# Patient Record
Sex: Female | Born: 1955 | Race: Black or African American | Hispanic: No | Smoking: Never smoker
Health system: Southern US, Community
[De-identification: ages and names within clinical notes are randomized; demographics above are authoritative.]

## PROBLEM LIST (undated history)

## (undated) DIAGNOSIS — I639 Cerebral infarction, unspecified: Secondary | ICD-10-CM

## (undated) DIAGNOSIS — E119 Type 2 diabetes mellitus without complications: Secondary | ICD-10-CM

## (undated) DIAGNOSIS — N189 Chronic kidney disease, unspecified: Secondary | ICD-10-CM

## (undated) DIAGNOSIS — I82409 Acute embolism and thrombosis of unspecified deep veins of unspecified lower extremity: Secondary | ICD-10-CM

## (undated) DIAGNOSIS — H547 Unspecified visual loss: Secondary | ICD-10-CM

## (undated) DIAGNOSIS — K219 Gastro-esophageal reflux disease without esophagitis: Secondary | ICD-10-CM

## (undated) DIAGNOSIS — D649 Anemia, unspecified: Secondary | ICD-10-CM

## (undated) DIAGNOSIS — I10 Essential (primary) hypertension: Secondary | ICD-10-CM

## (undated) HISTORY — DX: Chronic kidney disease, unspecified: N18.9

## (undated) HISTORY — DX: Gastro-esophageal reflux disease without esophagitis: K21.9

## (undated) HISTORY — DX: Acute embolism and thrombosis of unspecified deep veins of unspecified lower extremity: I82.409

## (undated) HISTORY — DX: Essential (primary) hypertension: I10

## (undated) HISTORY — DX: Unspecified visual loss: H54.7

## (undated) HISTORY — PX: APPENDECTOMY: SHX54

## (undated) HISTORY — DX: Anemia, unspecified: D64.9

## (undated) HISTORY — PX: LIPOMA EXCISION: SHX5283

## (undated) HISTORY — DX: Cerebral infarction, unspecified: I63.9

## (undated) HISTORY — DX: Type 2 diabetes mellitus without complications: E11.9

---

## 2010-05-30 ENCOUNTER — Other Ambulatory Visit (HOSPITAL_COMMUNITY): Payer: Self-pay

## 2010-05-30 DIAGNOSIS — R0602 Shortness of breath: Secondary | ICD-10-CM

## 2012-09-08 ENCOUNTER — Ambulatory Visit: Payer: Self-pay | Admitting: Internal Medicine

## 2012-09-09 ENCOUNTER — Encounter: Payer: Self-pay | Admitting: Internal Medicine

## 2012-09-09 ENCOUNTER — Ambulatory Visit (INDEPENDENT_AMBULATORY_CARE_PROVIDER_SITE_OTHER): Payer: 59 | Admitting: Internal Medicine

## 2012-09-09 VITALS — BP 151/93 | HR 92 | Temp 98.2°F | Ht 71.0 in | Wt 248.0 lb

## 2012-09-09 DIAGNOSIS — E1169 Type 2 diabetes mellitus with other specified complication: Secondary | ICD-10-CM | POA: Insufficient documentation

## 2012-09-09 DIAGNOSIS — N289 Disorder of kidney and ureter, unspecified: Secondary | ICD-10-CM

## 2012-09-09 DIAGNOSIS — M9080 Osteopathy in diseases classified elsewhere, unspecified site: Secondary | ICD-10-CM

## 2012-09-09 DIAGNOSIS — M869 Osteomyelitis, unspecified: Secondary | ICD-10-CM

## 2012-09-09 DIAGNOSIS — E1149 Type 2 diabetes mellitus with other diabetic neurological complication: Secondary | ICD-10-CM

## 2012-09-09 MED ORDER — LEVOFLOXACIN 750 MG PO TABS: 750.0000 mg | ORAL_TABLET | ORAL | Status: DC

## 2012-09-09 NOTE — Assessment & Plan Note (Addendum)
Her GFR is around 50 and I suspect it is going to decrease some with vancomycin. This will be monitored weekly.  If her creatinine worsens at all, she may need to change her metformin

## 2012-09-09 NOTE — Progress Notes (Signed)
  Subjective:    Patient ID: Emily Shea, female    DOB: 12/21/1955, 57 y.o.   MRN: [id number]  HPI He comes in for evaluation as a new patient. She has a history of diabetes and tells me it is in good control but has significant peripheral neuropathy and did develop a draining ulcer on the left great toe at the base of the nailbed. She did develop an abscess and did open on its own and drained pus. No culture has been obtained. She was placed empirically on doxycycline and has had some improvement. No fever or chills. She did receive a dose of Rocephin as well. She is been off of antibiotics for about 2 days. She had a bone scan done by her primary physician which is consistent with osteomyelitis. The lesion of osteomyelitis is at the area of the great toe where the wound developed. She is also developed 2 small areas on her left lateral malleolus and left side of her foot but are not draining and not infected. Some recent constipation.  She does not know of any history of poor circulation but denies any known recent test of her circulation. No history of smoking. No history of osteomyelitis. She is hopeful to keep her toe. No antibiotic allergies and no recent rashes or problems with her doxycycline.   Review of Systems  Constitutional: Negative for fatigue.  Respiratory: Negative for cough and shortness of breath.   Cardiovascular: Negative for leg swelling.  Gastrointestinal: Positive for constipation. Negative for nausea, abdominal pain and diarrhea.  Genitourinary: Negative for difficulty urinating.  Musculoskeletal: Negative for myalgias, joint swelling and arthralgias.  Skin: Negative for rash.  Neurological: Negative for dizziness, light-headedness and headaches.  Hematological: Negative for adenopathy.  Psychiatric/Behavioral: Negative for dysphoric mood.       Objective:   Physical Exam  Constitutional: She appears well-developed and well-nourished. No distress.  Eyes: No scleral  icterus.  Cardiovascular: Normal rate, regular rhythm and normal heart sounds.   No murmur heard. Pulmonary/Chest: Effort normal and breath sounds normal. No respiratory distress.  Abdominal: Soft. Bowel sounds are normal. She exhibits no distension. There is no tenderness.  Musculoskeletal: She exhibits edema.  She had trace edema of her feet bilateral  Lymphadenopathy:    She has no cervical adenopathy.  Skin:  Right great toe with open ulcer at the nailbed, dry with no drainage          Assessment & Plan:

## 2012-09-09 NOTE — Assessment & Plan Note (Signed)
She does have osteomyelitis by the bone scan. Since she has been on recent antibiotics, bone biopsy likely to be unrevealing. I will start her on empiric therapy with vancomycin with a trough level of 15-20 and also Levaquin at 750 mg every other day do to her mild renal insufficiency and likelihood for some decrease in her GFR on vancomycin. She will continue this for 6 weeks. We'll have her return in 2 weeks to assure treatment is going well. She will get weekly labs. I discussed with her at length that the hope is to treat this. There is always risk of unsuccessful treatment and need for amputation, and the patient is aware.

## 2012-09-09 NOTE — Assessment & Plan Note (Signed)
I did express her need to have good control of her diabetes through her medications and food intake. She voiced her understanding.

## 2012-09-10 ENCOUNTER — Ambulatory Visit (HOSPITAL_COMMUNITY)
Admission: RE | Admit: 2012-09-10 | Discharge: 2012-09-10 | Disposition: A | Discharge status: Home / Self Care | Payer: Medicare FFS | Source: Ambulatory Visit | Attending: Internal Medicine | Admitting: Internal Medicine

## 2012-09-10 ENCOUNTER — Other Ambulatory Visit: Payer: Self-pay | Admitting: Internal Medicine

## 2012-09-10 DIAGNOSIS — E1169 Type 2 diabetes mellitus with other specified complication: Secondary | ICD-10-CM

## 2012-09-10 DIAGNOSIS — M869 Osteomyelitis, unspecified: Secondary | ICD-10-CM

## 2012-09-23 ENCOUNTER — Ambulatory Visit (INDEPENDENT_AMBULATORY_CARE_PROVIDER_SITE_OTHER): Payer: 59 | Admitting: Internal Medicine

## 2012-09-23 ENCOUNTER — Encounter: Payer: Self-pay | Admitting: Internal Medicine

## 2012-09-23 VITALS — BP 159/91 | HR 99 | Temp 98.2°F | Ht 71.0 in | Wt 259.0 lb

## 2012-09-23 DIAGNOSIS — E1169 Type 2 diabetes mellitus with other specified complication: Secondary | ICD-10-CM

## 2012-09-23 DIAGNOSIS — M9080 Osteopathy in diseases classified elsewhere, unspecified site: Secondary | ICD-10-CM

## 2012-09-23 DIAGNOSIS — M869 Osteomyelitis, unspecified: Secondary | ICD-10-CM

## 2012-09-23 NOTE — Assessment & Plan Note (Addendum)
She is doing well with her treatment we'll continue for the presumed 6 weeks. She was told to continue to have good diabetes control and is going to back to her primary doctor for continued management to see if she needs an increase in her insulin. Her blood sugars do run close to 200 most the time including her fasting a.m. Blood sugar.   With the vancomycin and Levaquin and renal insufficiency, she likely will should not get increasing her metformin.

## 2012-09-23 NOTE — Progress Notes (Signed)
  Subjective:    Patient ID: Emily Shea, female    DOB: 11/21/1955, 57 y.o.   MRN: [id number]  HPI Comes in for follow up of osteomyelitis. She has a history of diabetes and tells me it is in good control but has significant peripheral neuropathy and did develop a draining ulcer on the left great toe at the base of the nailbed. She did develop an abscess and did open on its own and drained pus. No culture has been obtained. She was placed empirically on doxycycline and has had some improvement. No fever or chills. She did receive a dose of Rocephin as well. She is been off of antibiotics for about 2 days. She had a bone scan done by her primary physician which is consistent with osteomyelitis. The lesion of osteomyelitis is at the area of the great toe where the wound developed. She is also developed 2 small areas on her left lateral malleolus and left side of her foot but are not draining and not infected. Some recent constipation. She does not know of any history of poor circulation but denies any known recent test of her circulation. No history of smoking. No history of osteomyelitis. She is hopeful to keep her toe. No antibiotic allergies and no recent rashes or problems with her doxycycline.   She was started on empiric vancomycin and every other day Levaquin and is here for followup today.  Having no diarrhea, no rash or side effects. She is tolerating well and did start her antibiotics on June 13.    Review of Systems  Constitutional: Negative for fever and fatigue.  Gastrointestinal: Positive for constipation. Negative for nausea and diarrhea.  Skin: Negative for rash.  Neurological: Negative for dizziness, light-headedness and headaches.  Hematological: Negative for adenopathy.       Objective:   Physical Exam  Constitutional: She appears well-developed and well-nourished. No distress.  Skin:  Her right first toe is dry with the no discharge          Assessment & Plan:

## 2012-10-08 ENCOUNTER — Telehealth: Payer: Self-pay | Admitting: Licensed Clinical Social Worker

## 2012-10-08 NOTE — Telephone Encounter (Signed)
Patient's caretaker called stating that the patient has nausea, low back pain, and headaches that started 2 days ago. She had dizziness on Tuesday and fell without injury. Patient contacted primary they suggested she call us because it could be coming from her IV Vancomycin.Please advise

## 2012-10-12 NOTE — Telephone Encounter (Signed)
Emily Shea, patient should be seen asap, it sounds like she is dehydrated? Or hypotensive. Please call family to have her seen by pcp or urgent care or [place].

## 2012-10-18 ENCOUNTER — Telehealth: Payer: Self-pay | Admitting: *Deleted

## 2012-10-18 NOTE — Telephone Encounter (Signed)
Sarah, [facility] RN, calling to see if patient should keep PICC until her follow up appointment on 10/26/12. Pt's IV ABX to be stopped 10/22/12, when [facility] can remove the PICC. RN advised [facility] RN that pt should keep her PICC until f/u with Dr. Luciana Axe on the 29th and that we can pull the PICC here if necessary.  [facility] RN verbalized agreement, will give pt supplies and teaching to keep her PICC flushed and patent until f/u here. Andree Coss, RN

## 2012-10-26 ENCOUNTER — Telehealth: Payer: Self-pay | Admitting: *Deleted

## 2012-10-26 ENCOUNTER — Encounter: Payer: Self-pay | Admitting: Internal Medicine

## 2012-10-26 ENCOUNTER — Ambulatory Visit (INDEPENDENT_AMBULATORY_CARE_PROVIDER_SITE_OTHER): Payer: 59 | Admitting: Internal Medicine

## 2012-10-26 VITALS — BP 185/113 | HR 93 | Temp 98.2°F | Ht 71.0 in | Wt 259.0 lb

## 2012-10-26 DIAGNOSIS — E1169 Type 2 diabetes mellitus with other specified complication: Secondary | ICD-10-CM

## 2012-10-26 DIAGNOSIS — M869 Osteomyelitis, unspecified: Secondary | ICD-10-CM

## 2012-10-26 DIAGNOSIS — M9080 Osteopathy in diseases classified elsewhere, unspecified site: Secondary | ICD-10-CM

## 2012-10-26 NOTE — Progress Notes (Signed)
  Subjective:    Patient ID: Emily Shea, female    DOB: 12/24/1955, 57 y.o.   MRN: [id number]  HPI  Comes in for follow up of osteomyelitis, now 6 weeks into her 6 week course. She has a history of diabetes and tells me it is in good control but has significant peripheral neuropathy and did develop a draining ulcer on the left great toe at the base of the nailbed. She did develop an abscess and did open on its own and drained pus. No culture has been obtained. She was placed empirically on doxycycline and has had some improvement. No fever or chills. She did receive a dose of Rocephin as well. She is been off of antibiotics for about 2 days. She had a bone scan done by her primary physician which is consistent with osteomyelitis. The lesion of osteomyelitis is at the area of the great toe where the wound developed. She is also developed 2 small areas on her left lateral malleolus and left side of her foot but are not draining and not infected. Some recent constipation. She does not know of any history of poor circulation but denies any known recent test of her circulation. No history of smoking. No history of osteomyelitis.   She was started on empiric vancomycin and every other day Levaquin and is here for followup today. Having no diarrhea, no rash or side effects. She is tolerating well and did start her antibiotics on June 13.  She now has completed 6 weeks and is doing well. Her toe has no discharge and no increase in pain. She is having some headache and back pain. Her blood pressure was notably elevated today.   Review of Systems  Constitutional: Negative for fever and fatigue.  HENT: Negative for congestion.   Eyes: Negative for photophobia and visual disturbance.  Respiratory: Negative for shortness of breath.   Gastrointestinal: Negative for nausea and diarrhea.  Musculoskeletal: Positive for back pain.  Skin: Negative for rash.  Neurological: Positive for headaches. Negative for  dizziness and light-headedness.  Hematological: Negative for adenopathy.  Psychiatric/Behavioral: Negative for dysphoric mood.       Objective:   Physical Exam  Constitutional: She is oriented to person, place, and time. She appears well-developed and well-nourished. No distress.  Eyes: No scleral icterus.  Musculoskeletal:  PICC line was no discharge.  Neurological: She is alert and oriented to person, place, and time.  Skin: No rash noted.          Assessment & Plan:

## 2012-10-26 NOTE — Assessment & Plan Note (Signed)
She has now completed a Schnelle course of therapy. I will stop the antibiotics and home health will pull her PICC line. She can followup p.r.n. I did tell her if she develops significant pain in her toe or swelling or any new drainage that she should be seen by an orthopedist. She otherwise can return here for any concerns in the future.

## 2012-10-26 NOTE — Telephone Encounter (Signed)
Verbal order per Dr. Luciana Axe given to [facility] at [facility] to pull patient's picc line. Wendall Mola

## 2012-11-11 ENCOUNTER — Encounter: Payer: Self-pay | Admitting: Internal Medicine

## 2013-02-04 ENCOUNTER — Encounter: Payer: Self-pay | Admitting: Internal Medicine

## 2013-03-04 ENCOUNTER — Other Ambulatory Visit: Payer: Self-pay | Admitting: Vascular Surgery

## 2013-03-04 DIAGNOSIS — L97909 Non-pressure chronic ulcer of unspecified part of unspecified lower leg with unspecified severity: Secondary | ICD-10-CM

## 2013-03-04 DIAGNOSIS — I739 Peripheral vascular disease, unspecified: Secondary | ICD-10-CM

## 2013-03-07 ENCOUNTER — Encounter: Payer: Self-pay | Admitting: Vascular Surgery

## 2013-03-21 ENCOUNTER — Encounter: Payer: Self-pay | Admitting: Vascular Surgery

## 2013-03-22 ENCOUNTER — Ambulatory Visit (INDEPENDENT_AMBULATORY_CARE_PROVIDER_SITE_OTHER): Payer: 59 | Admitting: Vascular Surgery

## 2013-03-22 ENCOUNTER — Encounter (HOSPITAL_COMMUNITY): Payer: Self-pay | Admitting: Pharmacy Technician

## 2013-03-22 ENCOUNTER — Encounter (INDEPENDENT_AMBULATORY_CARE_PROVIDER_SITE_OTHER): Payer: Self-pay

## 2013-03-22 ENCOUNTER — Ambulatory Visit (HOSPITAL_COMMUNITY)
Admission: RE | Admit: 2013-03-22 | Discharge: 2013-03-22 | Disposition: A | Discharge status: Home / Self Care | Payer: Medicare FFS | Source: Ambulatory Visit | Attending: Vascular Surgery | Admitting: Vascular Surgery

## 2013-03-22 ENCOUNTER — Other Ambulatory Visit: Payer: Self-pay | Admitting: *Deleted

## 2013-03-22 ENCOUNTER — Encounter: Payer: Self-pay | Admitting: *Deleted

## 2013-03-22 ENCOUNTER — Encounter: Payer: Self-pay | Admitting: Vascular Surgery

## 2013-03-22 VITALS — BP 152/82 | HR 96 | Ht 71.0 in | Wt 256.8 lb

## 2013-03-22 DIAGNOSIS — I7025 Atherosclerosis of native arteries of other extremities with ulceration: Secondary | ICD-10-CM | POA: Insufficient documentation

## 2013-03-22 DIAGNOSIS — I739 Peripheral vascular disease, unspecified: Secondary | ICD-10-CM | POA: Insufficient documentation

## 2013-03-22 DIAGNOSIS — Z01818 Encounter for other preprocedural examination: Secondary | ICD-10-CM

## 2013-03-22 DIAGNOSIS — L97909 Non-pressure chronic ulcer of unspecified part of unspecified lower leg with unspecified severity: Secondary | ICD-10-CM

## 2013-03-22 NOTE — Progress Notes (Signed)
Patient name: Emily Shea MRN: [id number] DOB: 08/21/1955 Sex: female   Referred by: Tanda Rockers  Reason for referral:  Chief Complaint  Patient presents with  . New Evaluation    non healing ischemic L leg ulcer      HISTORY OF PRESENT ILLNESS: The patient presents today for evaluation of nonhealing wounds over her left lateral malleolus and left fifth metatarsal head. She has a [place] history of severe diabetes hypertension does have a history of stroke. This left her with visual changes. She has had a several month history of nonhealing ulceration over her left foot. She does report moderate discomfort associated with this. She also had a area of nonhealing ulcer over the tip of her left great toe proximal may 6 months ago. She had a PIC line for antibiotic treatment and eventually had healing of this.  Past Medical History  Diagnosis Date  . Diabetes mellitus without complication   . Chronic kidney disease   . GERD (gastroesophageal reflux disease)   . Stroke   . Hypertension   . Blindness   . Anemia   . DVT (deep venous thrombosis)     Past Surgical History  Procedure Laterality Date  . Appendectomy    . Lipoma excision      from L flank    History   Social History  . Marital Status: Divorced    Spouse Name: N/A    Number of Children: N/A  . Years of Education: N/A   Occupational History  . Not on file.   Social History Main Topics  . Smoking status: Never Smoker   . Smokeless tobacco: Never Used  . Alcohol Use: No  . Drug Use: No  . Sexual Activity: Not on file   Other Topics Concern  . Not on file   Social History Narrative  . No narrative on file    Family History  Problem Relation Age of Onset  . Diabetes Mother   . Heart disease Mother   . Hypertension Mother   . Peripheral vascular disease Mother     amputation  . Diabetes Father   . Hypertension Father   . Varicose Veins Father   . Peripheral vascular disease Father     amputation    . Cancer Sister   . Diabetes Sister   . Hypertension Sister   . Diabetes Brother   . Hypertension Brother   . Peripheral vascular disease Brother     amputation    Allergies as of 03/22/2013 - Review Complete 03/22/2013  Allergen Reaction Noted  . Procardia [nifedipine]  09/09/2012    Current Outpatient Prescriptions on File Prior to Visit  Medication Sig Dispense Refill  . aspirin 81 MG tablet Take 81 mg by mouth daily.      . ciclopirox (PENLAC) 8 % solution Apply 1 application topically at bedtime. Apply over nail and surrounding skin. Apply daily over previous coat. After seven (7) days, may remove with alcohol and continue cycle.      . ferrous sulfate 325 (65 FE) MG tablet Take 325 mg by mouth daily with breakfast.      . Liraglutide (VICTOZA [place]) Inject 18 mg into the skin daily.      Marland Kitchen losartan-hydrochlorothiazide (HYZAAR) 100-25 MG per tablet Take 1 tablet by mouth daily.      . metFORMIN (GLUCOPHAGE) 1000 MG tablet Take 850 mg by mouth 3 (three) times daily.      . metoprolol succinate (TOPROL-XL) 50  MG 24 hr tablet Take 50 mg by mouth daily. Take with or immediately following a meal.      . potassium chloride SA (K-DUR,KLOR-CON) 20 MEQ tablet Take 20 mEq by mouth daily.      . pravastatin (PRAVACHOL) 20 MG tablet Take 20 mg by mouth daily.      . ranitidine (ZANTAC) 150 MG tablet Take 150 mg by mouth daily.      . sodium chloride 0.9 % SOLN 250 mL with vancomycin 1000 MG SOLR 1,000 mg Inject 1,000 mg into the vein daily.      . solifenacin (VESICARE) 5 MG tablet Take 10 mg by mouth daily.      Marland Kitchen triamcinolone cream (KENALOG) 0.1 % Apply 1 application topically 2 (two) times daily.       No current facility-administered medications on file prior to visit.     REVIEW OF SYSTEMS:  Positives indicated with an "X"  CARDIOVASCULAR:  [ ]  chest pain   [ ]  chest pressure   [ ]  palpitations   [ ]  orthopnea   [ ]  dyspnea on exertion   [ ]  claudication   [ ]  rest pain   [x ]  DVT   [ ]  phlebitis PULMONARY:   [ ]  productive cough   [ ]  asthma   [ ]  wheezing NEUROLOGIC:   [ ]  weakness  [ ]  paresthesias  [ ]  aphasia  [ ]  amaurosis  [ ]  dizziness HEMATOLOGIC:   [ ]  bleeding problems   [ ]  clotting disorders MUSCULOSKELETAL:  [ ]  joint pain   [ ]  joint swelling GASTROINTESTINAL: [ ]   blood in stool  [ ]   hematemesis GENITOURINARY:  [ ]   dysuria  [ ]   hematuria PSYCHIATRIC:  [ ]  history of major depression INTEGUMENTARY:  [ ]  rashes  [x ] ulcers CONSTITUTIONAL:  [ ]  fever   [ ]  chills  PHYSICAL EXAMINATION:  General: The patient is a well-nourished female, in no acute distress. Vital signs are BP 152/82  Pulse 96  Ht 5\' 11"  (1.803 m)  Wt 256 lb 12.8 oz (116.484 kg)  BMI 35.83 kg/m2  SpO2 100% Pulmonary: There is a good air exchange bilaterally without wheezing or rales. Abdomen: Soft and non-tender with normal pitch bowel sounds. Musculoskeletal: There are no major deformities.   Neurologic: No focal weakness or paresthesias are detected, Skin: She does have a small half centimeter ulcer of her lateral malleolus on the left she has a more concerning wound over her lateral fifth metatarsal head with approximately 1 cm open ulceration and dry gangrenous changes around the periphery of this. Psychiatric: The patient has normal affect. Cardiovascular: There is a regular rate and rhythm without significant murmur appreciated. Pulse status 2+ radial 2+ femoral pulses. Absent popliteal and distal pulses bilaterally  [facility] Vascular Lab Studies:  Ordered and Independently Reviewed her ankle arm indices at outlying hospital are unreliable due to calcification. 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 Difficult to determine tibial flow. She did have normal triphasic flow to the level of her femoral artery on the  Impression and Plan:  Nonhealing ulcers on left foot related to arterial  insufficiency related to diabetes and hypertension. Explain the need for arteriography for further evaluation. Explain that she may be a candidate for stenting or may require bypass. Also explained that she may have unreconstructable disease. I did explain she is at  risk for amputation. I feel that she had minimum she would eventually require a left fifth toe ray amputation for healing. She does have a history of a very mild renal insufficiency. Most recent creatinine in our system is 1.25. She is scheduled for outpatient arteriography with Dr. Myra Gianotti on 12:30. She understands if she has reasonable angioplasty would be treated at this time. Otherwise she will have a discussion regarding bypass options.    Tylar Amborn [facility] [place] Office: [phone]

## 2013-03-29 ENCOUNTER — Ambulatory Visit (HOSPITAL_COMMUNITY)
Admission: RE | Admit: 2013-03-29 | Discharge: 2013-03-29 | Disposition: A | Discharge status: Home / Self Care | Payer: Medicare FFS | Source: Ambulatory Visit | Attending: Surgery | Admitting: Surgery

## 2013-03-29 ENCOUNTER — Encounter (HOSPITAL_COMMUNITY)
Admission: RE | Disposition: A | Discharge status: Home / Self Care | Payer: Self-pay | Source: Ambulatory Visit | Attending: Surgery

## 2013-03-29 DIAGNOSIS — Z01818 Encounter for other preprocedural examination: Secondary | ICD-10-CM

## 2013-03-29 DIAGNOSIS — B372 Candidiasis of skin and nail: Secondary | ICD-10-CM | POA: Diagnosis not present

## 2013-03-29 DIAGNOSIS — Z5309 Procedure and treatment not carried out because of other contraindication: Secondary | ICD-10-CM | POA: Diagnosis not present

## 2013-03-29 DIAGNOSIS — L97809 Non-pressure chronic ulcer of other part of unspecified lower leg with unspecified severity: Secondary | ICD-10-CM | POA: Diagnosis not present

## 2013-03-29 HISTORY — PX: ABDOMINAL AORTAGRAM: SHX5454

## 2013-03-29 LAB — GLUCOSE, CAPILLARY
Glucose-Capillary: 318 mg/dL — ABNORMAL HIGH (ref 70–99)
Glucose-Capillary: 287 mg/dL — ABNORMAL HIGH (ref 70–99)

## 2013-03-29 LAB — POCT I-STAT, CHEM 8
Creatinine, Ser: 1.3 mg/dL — ABNORMAL HIGH (ref 0.50–1.10)
HCT: 34 % — ABNORMAL LOW (ref 36.0–46.0)
Hemoglobin: 11.6 g/dL — ABNORMAL LOW (ref 12.0–15.0)
Potassium: 4.1 mEq/L (ref 3.7–5.3)
Sodium: 137 mEq/L (ref 137–147)
TCO2: 22 mmol/L (ref 0–100)

## 2013-03-29 SURGERY — ABDOMINAL AORTAGRAM
Anesthesia: LOCAL

## 2013-03-29 MED ORDER — LIDOCAINE HCL (PF) 1 % IJ SOLN
INTRAMUSCULAR | Status: AC
  Filled 2013-03-29: qty 30

## 2013-03-29 MED ORDER — INSULIN ASPART 100 UNIT/ML ~~LOC~~ SOLN
10.0000 [IU] | Freq: Once | SUBCUTANEOUS | Status: AC
  Administered 2013-03-29: 10 [IU] via SUBCUTANEOUS
  Filled 2013-03-29: qty 1

## 2013-03-29 MED ORDER — SODIUM CHLORIDE 0.9 % IV SOLN
INTRAVENOUS | Status: DC
  Administered 2013-03-29: 11:00:00 via INTRAVENOUS

## 2013-03-29 MED ORDER — HEPARIN (PORCINE) IN NACL 2-0.9 UNIT/ML-% IJ SOLN
INTRAMUSCULAR | Status: AC
  Filled 2013-03-29: qty 1000

## 2013-03-29 SURGICAL SUPPLY — 53 items
BANDAGE ELASTIC 4 VELCRO ST LF (GAUZE/BANDAGES/DRESSINGS) IMPLANT
BANDAGE ESMARK 6X9 LF (GAUZE/BANDAGES/DRESSINGS) IMPLANT
BNDG ESMARK 6X9 LF (GAUZE/BANDAGES/DRESSINGS)
CANISTER SUCTION 2500CC (MISCELLANEOUS) ×2 IMPLANT
CLIP TI MEDIUM 24 (CLIP) ×2 IMPLANT
CLIP TI WIDE RED SMALL 24 (CLIP) ×2 IMPLANT
COVER SURGICAL LIGHT HANDLE (MISCELLANEOUS) ×2 IMPLANT
CUFF TOURNIQUET SINGLE 24IN (TOURNIQUET CUFF) IMPLANT
CUFF TOURNIQUET SINGLE 34IN LL (TOURNIQUET CUFF) IMPLANT
CUFF TOURNIQUET SINGLE 44IN (TOURNIQUET CUFF) IMPLANT
DERMABOND ADVANCED (GAUZE/BANDAGES/DRESSINGS) ×1
DERMABOND ADVANCED .7 DNX12 (GAUZE/BANDAGES/DRESSINGS) ×1 IMPLANT
DRAIN CHANNEL 15F RND FF W/TCR (WOUND CARE) IMPLANT
DRAPE WARM FLUID 44X44 (DRAPE) ×2 IMPLANT
DRAPE X-RAY CASS 24X20 (DRAPES) IMPLANT
DRSG COVADERM 4X10 (GAUZE/BANDAGES/DRESSINGS) IMPLANT
DRSG COVADERM 4X8 (GAUZE/BANDAGES/DRESSINGS) IMPLANT
ELECT REM PT RETURN 9FT ADLT (ELECTROSURGICAL) ×2
ELECTRODE REM PT RTRN 9FT ADLT (ELECTROSURGICAL) ×1 IMPLANT
EVACUATOR SILICONE 100CC (DRAIN) IMPLANT
GLOVE BIOGEL PI IND STRL 7.5 (GLOVE) ×1 IMPLANT
GLOVE BIOGEL PI INDICATOR 7.5 (GLOVE) ×1
GLOVE SURG SS PI 7.5 STRL IVOR (GLOVE) ×2 IMPLANT
GOWN PREVENTION PLUS XXLARGE (GOWN DISPOSABLE) ×2 IMPLANT
GOWN STRL NON-REIN LRG LVL3 (GOWN DISPOSABLE) ×6 IMPLANT
HEMOSTAT SNOW SURGICEL 2X4 (HEMOSTASIS) IMPLANT
KIT BASIN OR (CUSTOM PROCEDURE TRAY) ×2 IMPLANT
KIT ROOM TURNOVER OR (KITS) ×2 IMPLANT
MARKER GRAFT CORONARY BYPASS (MISCELLANEOUS) IMPLANT
NS IRRIG 1000ML POUR BTL (IV SOLUTION) ×4 IMPLANT
PACK PERIPHERAL VASCULAR (CUSTOM PROCEDURE TRAY) ×2 IMPLANT
PAD ARMBOARD 7.5X6 YLW CONV (MISCELLANEOUS) ×4 IMPLANT
PADDING CAST COTTON 6X4 STRL (CAST SUPPLIES) IMPLANT
SET COLLECT BLD 21X3/4 12 (NEEDLE) IMPLANT
STOPCOCK 4 WAY LG BORE MALE ST (IV SETS) IMPLANT
SUT ETHILON 3 0 PS 1 (SUTURE) IMPLANT
SUT PROLENE 5 0 C 1 24 (SUTURE) ×2 IMPLANT
SUT PROLENE 6 0 BV (SUTURE) ×2 IMPLANT
SUT PROLENE 7 0 BV 1 (SUTURE) IMPLANT
SUT SILK 2 0 SH (SUTURE) ×2 IMPLANT
SUT SILK 3 0 (SUTURE)
SUT SILK 3-0 18XBRD TIE 12 (SUTURE) IMPLANT
SUT VIC AB 2-0 CT1 27 (SUTURE) ×2
SUT VIC AB 2-0 CT1 TAPERPNT 27 (SUTURE) ×2 IMPLANT
SUT VIC AB 3-0 SH 27 (SUTURE) ×2
SUT VIC AB 3-0 SH 27X BRD (SUTURE) ×2 IMPLANT
SUT VICRYL 4-0 PS2 18IN ABS (SUTURE) ×4 IMPLANT
TOWEL OR 17X24 6PK STRL BLUE (TOWEL DISPOSABLE) ×4 IMPLANT
TOWEL OR 17X26 10 PK STRL BLUE (TOWEL DISPOSABLE) ×4 IMPLANT
TRAY FOLEY CATH 16FRSI W/METER (SET/KITS/TRAYS/PACK) ×2 IMPLANT
TUBING EXTENTION W/L.L. (IV SETS) IMPLANT
UNDERPAD 30X30 INCONTINENT (UNDERPADS AND DIAPERS) ×2 IMPLANT
WATER STERILE IRR 1000ML POUR (IV SOLUTION) ×2 IMPLANT

## 2013-03-29 NOTE — Interval H&P Note (Signed)
History and Physical Interval Note:  03/29/2013 1:25 PM  Emily Shea  has presented today for surgery, with the diagnosis of PVD  The various methods of treatment have been discussed with the patient and family. After consideration of risks, benefits and other options for treatment, the patient has consented to  Procedure(s): ABDOMINAL AORTAGRAM (N/A) as a surgical intervention .  The patient's history has been reviewed, patient examined, no change in status, stable for surgery.  I have reviewed the patient's chart and labs.  Questions were answered to the patient's satisfaction.     Tra Wilemon IV, V. WELLS

## 2013-03-29 NOTE — Op Note (Signed)
Procedure cancelled due to severe right groin fungal/yeast infection Pt given nystatin and diflucan and will be re-scheduled for next week

## 2013-03-29 NOTE — H&P (View-Only) (Signed)
   Patient name: Emily Shea MRN: [id number] DOB: 02/01/1956 Sex: female   Referred by: Nichols  Reason for referral:  Chief Complaint  Patient presents with  . New Evaluation    non healing ischemic L leg ulcer      HISTORY OF PRESENT ILLNESS: The patient presents today for evaluation of nonhealing wounds over her left lateral malleolus and left fifth metatarsal head. She has a Briggs history of severe diabetes hypertension does have a history of stroke. This left her with visual changes. She has had a several month history of nonhealing ulceration over her left foot. She does report moderate discomfort associated with this. She also had a area of nonhealing ulcer over the tip of her left great toe proximal may 6 months ago. She had a PIC line for antibiotic treatment and eventually had healing of this.  Past Medical History  Diagnosis Date  . Diabetes mellitus without complication   . Chronic kidney disease   . GERD (gastroesophageal reflux disease)   . Stroke   . Hypertension   . Blindness   . Anemia   . DVT (deep venous thrombosis)     Past Surgical History  Procedure Laterality Date  . Appendectomy    . Lipoma excision      from L flank    History   Social History  . Marital Status: Divorced    Spouse Name: N/A    Number of Children: N/A  . Years of Education: N/A   Occupational History  . Not on file.   Social History Main Topics  . Smoking status: Never Smoker   . Smokeless tobacco: Never Used  . Alcohol Use: No  . Drug Use: No  . Sexual Activity: Not on file   Other Topics Concern  . Not on file   Social History Narrative  . No narrative on file    Family History  Problem Relation Age of Onset  . Diabetes Mother   . Heart disease Mother   . Hypertension Mother   . Peripheral vascular disease Mother     amputation  . Diabetes Father   . Hypertension Father   . Varicose Veins Father   . Peripheral vascular disease Father     amputation    . Cancer Sister   . Diabetes Sister   . Hypertension Sister   . Diabetes Brother   . Hypertension Brother   . Peripheral vascular disease Brother     amputation    Allergies as of 03/22/2013 - Review Complete 03/22/2013  Allergen Reaction Noted  . Procardia [nifedipine]  09/09/2012    Current Outpatient Prescriptions on File Prior to Visit  Medication Sig Dispense Refill  . aspirin 81 MG tablet Take 81 mg by mouth daily.      . ciclopirox (PENLAC) 8 % solution Apply 1 application topically at bedtime. Apply over nail and surrounding skin. Apply daily over previous coat. After seven (7) days, may remove with alcohol and continue cycle.      . ferrous sulfate 325 (65 FE) MG tablet Take 325 mg by mouth daily with breakfast.      . Liraglutide (VICTOZA [place]) Inject 18 mg into the skin daily.      . losartan-hydrochlorothiazide (HYZAAR) 100-25 MG per tablet Take 1 tablet by mouth daily.      . metFORMIN (GLUCOPHAGE) 1000 MG tablet Take 850 mg by mouth 3 (three) times daily.      . metoprolol succinate (TOPROL-XL) 50   MG 24 hr tablet Take 50 mg by mouth daily. Take with or immediately following a meal.      . potassium chloride SA (K-DUR,KLOR-CON) 20 MEQ tablet Take 20 mEq by mouth daily.      . pravastatin (PRAVACHOL) 20 MG tablet Take 20 mg by mouth daily.      . ranitidine (ZANTAC) 150 MG tablet Take 150 mg by mouth daily.      . sodium chloride 0.9 % SOLN 250 mL with vancomycin 1000 MG SOLR 1,000 mg Inject 1,000 mg into the vein daily.      . solifenacin (VESICARE) 5 MG tablet Take 10 mg by mouth daily.      . triamcinolone cream (KENALOG) 0.1 % Apply 1 application topically 2 (two) times daily.       No current facility-administered medications on file prior to visit.     REVIEW OF SYSTEMS:  Positives indicated with an "X"  CARDIOVASCULAR:  [ ] chest pain   [ ] chest pressure   [ ] palpitations   [ ] orthopnea   [ ] dyspnea on exertion   [ ] claudication   [ ] rest pain   [x ]  DVT   [ ] phlebitis PULMONARY:   [ ] productive cough   [ ] asthma   [ ] wheezing NEUROLOGIC:   [ ] weakness  [ ] paresthesias  [ ] aphasia  [ ] amaurosis  [ ] dizziness HEMATOLOGIC:   [ ] bleeding problems   [ ] clotting disorders MUSCULOSKELETAL:  [ ] joint pain   [ ] joint swelling GASTROINTESTINAL: [ ]  blood in stool  [ ]  hematemesis GENITOURINARY:  [ ]  dysuria  [ ]  hematuria PSYCHIATRIC:  [ ] history of major depression INTEGUMENTARY:  [ ] rashes  [x ] ulcers CONSTITUTIONAL:  [ ] fever   [ ] chills  PHYSICAL EXAMINATION:  General: The patient is a well-nourished female, in no acute distress. Vital signs are BP 152/82  Pulse 96  Ht 5' 11" (1.803 m)  Wt 256 lb 12.8 oz (116.484 kg)  BMI 35.83 kg/m2  SpO2 100% Pulmonary: There is a good air exchange bilaterally without wheezing or rales. Abdomen: Soft and non-tender with normal pitch bowel sounds. Musculoskeletal: There are no major deformities.   Neurologic: No focal weakness or paresthesias are detected, Skin: She does have a small half centimeter ulcer of her lateral malleolus on the left she has a more concerning wound over her lateral fifth metatarsal head with approximately 1 cm open ulceration and dry gangrenous changes around the periphery of this. Psychiatric: The patient has normal affect. Cardiovascular: There is a regular rate and rhythm without significant murmur appreciated. Pulse status 2+ radial 2+ femoral pulses. Absent popliteal and distal pulses bilaterally  [facility] Vascular Lab Studies:  Ordered and Independently Reviewed her ankle arm indices at outlying hospital are unreliable due to calcification. 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 Difficult to determine tibial flow. She did have normal triphasic flow to the level of her femoral artery on the  Impression and Plan:  Nonhealing ulcers on left foot related to arterial  insufficiency related to diabetes and hypertension. Explain the need for arteriography for further evaluation. Explain that she may be a candidate for stenting or may require bypass. Also explained that she may have unreconstructable disease. I did explain she is at   risk for amputation. I feel that she had minimum she would eventually require a left fifth toe ray amputation for healing. She does have a history of a very mild renal insufficiency. Most recent creatinine in our system is 1.25. She is scheduled for outpatient arteriography with Dr. Brabham on 12:30. She understands if she has reasonable angioplasty would be treated at this time. Otherwise she will have a discussion regarding bypass options.    EARLY, TODD [facility] Office: [phone]         

## 2013-04-01 ENCOUNTER — Inpatient Hospital Stay (HOSPITAL_COMMUNITY)
Admission: AD | Admit: 2013-04-01 | Discharge: 2013-04-07 | DRG: 239 | Disposition: A | Discharge status: Skilled Nursing Facility | Payer: Medicare FFS | Source: Other Acute Inpatient Hospital | Attending: Internal Medicine | Admitting: Internal Medicine

## 2013-04-01 DIAGNOSIS — Z889 Allergy status to unspecified drugs, medicaments and biological substances status: Secondary | ICD-10-CM

## 2013-04-01 DIAGNOSIS — I129 Hypertensive chronic kidney disease with stage 1 through stage 4 chronic kidney disease, or unspecified chronic kidney disease: Secondary | ICD-10-CM | POA: Diagnosis present

## 2013-04-01 DIAGNOSIS — R599 Enlarged lymph nodes, unspecified: Secondary | ICD-10-CM | POA: Diagnosis present

## 2013-04-01 DIAGNOSIS — Z8673 Personal history of transient ischemic attack (TIA), and cerebral infarction without residual deficits: Secondary | ICD-10-CM

## 2013-04-01 DIAGNOSIS — D72829 Elevated white blood cell count, unspecified: Secondary | ICD-10-CM | POA: Diagnosis present

## 2013-04-01 DIAGNOSIS — Z86718 Personal history of other venous thrombosis and embolism: Secondary | ICD-10-CM

## 2013-04-01 DIAGNOSIS — Z809 Family history of malignant neoplasm, unspecified: Secondary | ICD-10-CM

## 2013-04-01 DIAGNOSIS — E1165 Type 2 diabetes mellitus with hyperglycemia: Secondary | ICD-10-CM | POA: Diagnosis present

## 2013-04-01 DIAGNOSIS — N183 Chronic kidney disease, stage 3 unspecified: Secondary | ICD-10-CM

## 2013-04-01 DIAGNOSIS — D62 Acute posthemorrhagic anemia: Secondary | ICD-10-CM | POA: Diagnosis not present

## 2013-04-01 DIAGNOSIS — L97309 Non-pressure chronic ulcer of unspecified ankle with unspecified severity: Secondary | ICD-10-CM | POA: Diagnosis present

## 2013-04-01 DIAGNOSIS — B3789 Other sites of candidiasis: Secondary | ICD-10-CM | POA: Diagnosis not present

## 2013-04-01 DIAGNOSIS — D509 Iron deficiency anemia, unspecified: Secondary | ICD-10-CM | POA: Diagnosis present

## 2013-04-01 DIAGNOSIS — Z79899 Other long term (current) drug therapy: Secondary | ICD-10-CM

## 2013-04-01 DIAGNOSIS — I70299 Other atherosclerosis of native arteries of extremities, unspecified extremity: Secondary | ICD-10-CM | POA: Diagnosis present

## 2013-04-01 DIAGNOSIS — L98499 Non-pressure chronic ulcer of skin of other sites with unspecified severity: Secondary | ICD-10-CM

## 2013-04-01 DIAGNOSIS — Z7982 Long term (current) use of aspirin: Secondary | ICD-10-CM

## 2013-04-01 DIAGNOSIS — M9080 Osteopathy in diseases classified elsewhere, unspecified site: Secondary | ICD-10-CM

## 2013-04-01 DIAGNOSIS — Z833 Family history of diabetes mellitus: Secondary | ICD-10-CM

## 2013-04-01 DIAGNOSIS — I739 Peripheral vascular disease, unspecified: Secondary | ICD-10-CM | POA: Diagnosis present

## 2013-04-01 DIAGNOSIS — Z8249 Family history of ischemic heart disease and other diseases of the circulatory system: Secondary | ICD-10-CM

## 2013-04-01 DIAGNOSIS — H543 Unqualified visual loss, both eyes: Secondary | ICD-10-CM | POA: Diagnosis present

## 2013-04-01 DIAGNOSIS — Z6835 Body mass index (BMI) 35.0-35.9, adult: Secondary | ICD-10-CM

## 2013-04-01 DIAGNOSIS — K219 Gastro-esophageal reflux disease without esophagitis: Secondary | ICD-10-CM | POA: Diagnosis present

## 2013-04-01 DIAGNOSIS — M869 Osteomyelitis, unspecified: Secondary | ICD-10-CM

## 2013-04-01 DIAGNOSIS — IMO0002 Reserved for concepts with insufficient information to code with codable children: Secondary | ICD-10-CM | POA: Diagnosis present

## 2013-04-01 DIAGNOSIS — L97509 Non-pressure chronic ulcer of other part of unspecified foot with unspecified severity: Secondary | ICD-10-CM | POA: Diagnosis present

## 2013-04-01 DIAGNOSIS — I10 Essential (primary) hypertension: Secondary | ICD-10-CM

## 2013-04-01 DIAGNOSIS — I7025 Atherosclerosis of native arteries of other extremities with ulceration: Secondary | ICD-10-CM | POA: Diagnosis present

## 2013-04-01 DIAGNOSIS — E785 Hyperlipidemia, unspecified: Secondary | ICD-10-CM | POA: Diagnosis present

## 2013-04-01 DIAGNOSIS — E1169 Type 2 diabetes mellitus with other specified complication: Secondary | ICD-10-CM

## 2013-04-01 DIAGNOSIS — E1149 Type 2 diabetes mellitus with other diabetic neurological complication: Secondary | ICD-10-CM | POA: Diagnosis present

## 2013-04-01 DIAGNOSIS — A480 Gas gangrene: Secondary | ICD-10-CM

## 2013-04-01 DIAGNOSIS — IMO0001 Reserved for inherently not codable concepts without codable children: Secondary | ICD-10-CM | POA: Diagnosis present

## 2013-04-01 DIAGNOSIS — E1159 Type 2 diabetes mellitus with other circulatory complications: Principal | ICD-10-CM | POA: Diagnosis present

## 2013-04-01 DIAGNOSIS — D649 Anemia, unspecified: Secondary | ICD-10-CM

## 2013-04-01 LAB — GLUCOSE, CAPILLARY: Glucose-Capillary: 147 mg/dL — ABNORMAL HIGH (ref 70–99)

## 2013-04-01 MED ORDER — FLUCONAZOLE 200 MG PO TABS
200.0000 mg | ORAL_TABLET | Freq: Every day | ORAL | Status: DC
  Administered 2013-04-02 – 2013-04-03 (×3): 200 mg via ORAL
  Filled 2013-04-01 (×7): qty 1

## 2013-04-01 MED ORDER — ZOLPIDEM TARTRATE 5 MG PO TABS: 5.0000 mg | ORAL_TABLET | Freq: Every evening | ORAL | Status: DC | PRN

## 2013-04-01 MED ORDER — SIMVASTATIN 10 MG PO TABS
10.0000 mg | ORAL_TABLET | Freq: Every day | ORAL | Status: DC
  Administered 2013-04-02 – 2013-04-06 (×5): 10 mg via ORAL
  Filled 2013-04-01 (×6): qty 1

## 2013-04-01 MED ORDER — FERROUS SULFATE 325 (65 FE) MG PO TABS
325.0000 mg | ORAL_TABLET | Freq: Every day | ORAL | Status: DC
  Administered 2013-04-02 – 2013-04-05 (×3): 325 mg via ORAL
  Filled 2013-04-01 (×5): qty 1

## 2013-04-01 MED ORDER — LOSARTAN POTASSIUM-HCTZ 100-25 MG PO TABS: 1.0000 | ORAL_TABLET | Freq: Every day | ORAL | Status: DC

## 2013-04-01 MED ORDER — NYSTATIN 100000 UNIT/GM EX POWD
Freq: Three times a day (TID) | CUTANEOUS | Status: DC
  Administered 2013-04-02 – 2013-04-07 (×14): via TOPICAL
  Filled 2013-04-01 (×2): qty 15

## 2013-04-01 MED ORDER — HYDROMORPHONE HCL PF 1 MG/ML IJ SOLN
0.5000 mg | INTRAMUSCULAR | Status: DC | PRN
  Administered 2013-04-04: 1 mg via INTRAVENOUS
  Filled 2013-04-01 (×2): qty 1

## 2013-04-01 MED ORDER — POTASSIUM CHLORIDE CRYS ER 20 MEQ PO TBCR
20.0000 meq | EXTENDED_RELEASE_TABLET | Freq: Every day | ORAL | Status: DC
  Administered 2013-04-02 – 2013-04-07 (×5): 20 meq via ORAL
  Filled 2013-04-01 (×6): qty 1

## 2013-04-01 MED ORDER — HYDROCHLOROTHIAZIDE 25 MG PO TABS
25.0000 mg | ORAL_TABLET | Freq: Every day | ORAL | Status: DC
  Administered 2013-04-02 – 2013-04-04 (×3): 25 mg via ORAL
  Filled 2013-04-01 (×3): qty 1

## 2013-04-01 MED ORDER — ALUM & MAG HYDROXIDE-SIMETH 200-200-20 MG/5ML PO SUSP: 30.0000 mL | Freq: Four times a day (QID) | ORAL | Status: DC | PRN

## 2013-04-01 MED ORDER — ACETAMINOPHEN 650 MG RE SUPP
650.0000 mg | Freq: Four times a day (QID) | RECTAL | Status: DC | PRN
Start: 2013-04-01 — End: 2013-04-07

## 2013-04-01 MED ORDER — SODIUM CHLORIDE 0.9 % IV SOLN
INTRAVENOUS | Status: DC
  Administered 2013-04-01: 23:00:00 via INTRAVENOUS

## 2013-04-01 MED ORDER — ONDANSETRON HCL 4 MG PO TABS: 4.0000 mg | ORAL_TABLET | Freq: Four times a day (QID) | ORAL | Status: DC | PRN

## 2013-04-01 MED ORDER — OXYCODONE HCL 5 MG PO TABS
5.0000 mg | ORAL_TABLET | ORAL | Status: DC | PRN
  Administered 2013-04-02 – 2013-04-07 (×5): 5 mg via ORAL
  Filled 2013-04-01 (×5): qty 1

## 2013-04-01 MED ORDER — SODIUM CHLORIDE 0.9 % IV SOLN
1500.0000 mg | INTRAVENOUS | Status: DC
  Administered 2013-04-02 – 2013-04-07 (×6): 1500 mg via INTRAVENOUS
  Filled 2013-04-01 (×6): qty 1500

## 2013-04-01 MED ORDER — INSULIN ASPART 100 UNIT/ML ~~LOC~~ SOLN
0.0000 [IU] | SUBCUTANEOUS | Status: DC
  Administered 2013-04-01: 1 [IU] via SUBCUTANEOUS
  Administered 2013-04-02: 2 [IU] via SUBCUTANEOUS
  Administered 2013-04-02: 3 [IU] via SUBCUTANEOUS

## 2013-04-01 MED ORDER — DEXTROSE 5 % IV SOLN
1.0000 g | Freq: Two times a day (BID) | INTRAVENOUS | Status: DC
  Administered 2013-04-02 – 2013-04-07 (×10): 1 g via INTRAVENOUS
  Filled 2013-04-01 (×13): qty 1

## 2013-04-01 MED ORDER — LATANOPROST 0.005 % OP SOLN
1.0000 [drp] | Freq: Every day | OPHTHALMIC | Status: DC
  Administered 2013-04-01 – 2013-04-06 (×6): 1 [drp] via OPHTHALMIC
  Filled 2013-04-01 (×3): qty 2.5

## 2013-04-01 MED ORDER — LOSARTAN POTASSIUM 50 MG PO TABS
100.0000 mg | ORAL_TABLET | Freq: Every day | ORAL | Status: DC
  Administered 2013-04-02 – 2013-04-04 (×3): 100 mg via ORAL
  Filled 2013-04-01 (×3): qty 2

## 2013-04-01 MED ORDER — DARIFENACIN HYDROBROMIDE ER 15 MG PO TB24
15.0000 mg | ORAL_TABLET | Freq: Every day | ORAL | Status: DC
  Administered 2013-04-02 – 2013-04-07 (×5): 15 mg via ORAL
  Filled 2013-04-01 (×6): qty 1

## 2013-04-01 MED ORDER — ACETAMINOPHEN 325 MG PO TABS
650.0000 mg | ORAL_TABLET | Freq: Four times a day (QID) | ORAL | Status: DC | PRN
Start: 2013-04-01 — End: 2013-04-07
  Administered 2013-04-02: 650 mg via ORAL
  Filled 2013-04-01: qty 2

## 2013-04-01 MED ORDER — METOPROLOL SUCCINATE ER 50 MG PO TB24
50.0000 mg | ORAL_TABLET | Freq: Every day | ORAL | Status: DC
  Administered 2013-04-02 – 2013-04-07 (×6): 50 mg via ORAL
  Filled 2013-04-01 (×7): qty 1

## 2013-04-01 MED ORDER — GABAPENTIN 300 MG PO CAPS
300.0000 mg | ORAL_CAPSULE | Freq: Every day | ORAL | Status: DC
  Administered 2013-04-02: 300 mg via ORAL
  Filled 2013-04-01: qty 1

## 2013-04-01 MED ORDER — AMLODIPINE BESYLATE 2.5 MG PO TABS
2.5000 mg | ORAL_TABLET | Freq: Every day | ORAL | Status: DC
  Administered 2013-04-02 – 2013-04-06 (×5): 2.5 mg via ORAL
  Filled 2013-04-01 (×6): qty 1

## 2013-04-01 MED ORDER — FAMOTIDINE 20 MG PO TABS
20.0000 mg | ORAL_TABLET | Freq: Two times a day (BID) | ORAL | Status: DC
  Administered 2013-04-01 – 2013-04-07 (×11): 20 mg via ORAL
  Filled 2013-04-01 (×13): qty 1

## 2013-04-01 MED ORDER — ONDANSETRON HCL 4 MG/2ML IJ SOLN: 4.0000 mg | Freq: Four times a day (QID) | INTRAMUSCULAR | Status: DC | PRN

## 2013-04-01 NOTE — Progress Notes (Addendum)
ANTIBIOTIC CONSULT NOTE - INITIAL  Pharmacy Consult for Vancomycin Indication: osteomyelitis, left foot  Allergies  Allergen Reactions  . Procardia [Nifedipine] Other (See Comments)    Constipation, gum irritation    Patient Measurements: Height: 5\' 11"  (180.3 cm) Weight: 256 lb (116.121 kg) IBW/kg (Calculated) : 70.8  Vital Signs: Temp: 99.2 F (37.3 C) (01/02 2057) Temp src: Oral (01/02 2057) BP: 147/81 mmHg (01/02 2057) Pulse Rate: 100 (01/02 2057)  [facility]:  Scr 1.86 on 1/1, and 1/2 on 1.60 (Scr was 1.3  On 03/[facility]/13 [facility])  WBC 20.7 on 1/1 and 14.7 on 1/2  Medical History: Past Medical History  Diagnosis Date  . Diabetes mellitus without complication   . Chronic kidney disease   . GERD (gastroesophageal reflux disease)   . Stroke   . Hypertension   . Blindness   . Anemia   . DVT (deep venous thrombosis)    Assessment:   [facility].   Vancomycin and Invanz begun on 03/31/13.  Received Vancomycin 1500 mg IV on 1/1 ~3pm and 1750 mg IV on 1/2 ~1pm.   Last Invanz 1 gram IV dose given 1/2 ~10am. [facility].  To begin on 1/3. Hx non-healing wounds. Noted aortagram planned 03/29/13 but cancelled due to right groin fungal/yeast infection. Patient reports beginning Nystatin powder & Diflucan on that day; infection not yet cleared.  Goal of Therapy:  Vancomycin trough level 15-20 mcg/ml  Plan:   Vancomycin 1500 mg IV q24hrs.  Next dose at 2pm on 1/3.   Will follow up renal function, and plan to check Vancomycin trough level at steady state.      Resume Diflucan & Nystatin?   Addendum: discussed with Dr. Lovell SheehanJenkins. Both meds to resume.  Dennie FettersEgan, Emmry Hinsch Donovan, RPh Pager: [phone] 04/01/2013,11:18 PM

## 2013-04-01 NOTE — H&P (Signed)
[facility] History and Physical  Emily Shea WUJ:[id number] DOB: 02-Dec-1955 DOA: 04/01/2013  Referring physician:  EDP PCP: Emily Pandy, MD  Specialists:   Chief Complaint:  Worsening diabetic Ulcer of the Left Foot  HPI: Emily Shea is a 58 y.o. female who was seen at [facility] in [place] and was transferred to [facility] for further evalauation and treatment of an Ischemic and gangrenous Left Diabetic Foot ulcer.   She had been seen in the ED at [facility] on 03/31/2013 and admitted and was transfered after the case had been discussed with Vascular Surgery  Emily Shea.   She had been given IV Vancomycin and IV Invanz at [facility].     As for the diabetic ulcer, the patient reports that the ulcer began several months about 1 month ago as a blister on the side of her left foot and 5th toe and an ulcer on the left lateral ankle.   The ulcer of her toe worsened over that time period and began to turn black and drain a foul smelling drainage.   She denies having any fevers or chills.  She was evaluated by Vascular Emily Shea on 03/22/2013, and by Emily Shea on 03/30/2013  For an ABD aortogram procedure to evaluated for Bypass Grafting potential of her Left Lower Extremity.   The procedure was postponed so that she could receive treatment for an intertriginous candidal infection of her groin area and she was started on Diflucan and Nystatin Powder TID.      Review of Systems: The patient denies anorexia, fever, chills, headaches, weight loss, diplopia, dizziness, decreased hearing, rhinitis, hoarseness, chest pain, syncope, dyspnea on exertion, peripheral edema, balance deficits, cough, hemoptysis, abdominal pain, nausea, vomiting, diarrhea, constipation, hematemesis, melena, hematochezia, severe indigestion/heartburn, dysuria, hematuria, incontinence, muscle weakness, transient blindness, difficulty walking, depression, unusual weight change, abnormal bleeding, enlarged lymph  nodes, angioedema, and breast masses.    Past Medical History  Diagnosis Date  . Diabetes mellitus without complication   . Chronic kidney disease   . GERD (gastroesophageal reflux disease)   . Stroke   . Hypertension   . Blindness   . Anemia   . DVT (deep venous thrombosis)     Past Surgical History  Procedure Laterality Date  . Appendectomy    . Lipoma excision      from L flank    Prior to Admission medications   Medication Sig Start Date End Date Taking? Authorizing Provider  amLODipine (NORVASC) 2.5 MG tablet Take 2.5 mg by mouth daily.    Historical Provider, MD  aspirin 81 MG tablet Take 81 mg by mouth daily.    Historical Provider, MD  ciclopirox (PENLAC) 8 % solution Apply 1 application topically at bedtime. Apply over nail and surrounding skin. Apply daily over previous coat. After seven (7) days, may remove with alcohol and continue cycle.    Historical Provider, MD  ferrous sulfate 325 (65 FE) MG tablet Take 325 mg by mouth daily with breakfast.    Historical Provider, MD  gabapentin (NEURONTIN) 300 MG capsule Take 300 mg by mouth daily.    Historical Provider, MD  HYDROcodone-acetaminophen (NORCO) 10-325 MG per tablet Take 1 tablet by mouth every 6 (six) hours as needed for moderate pain.     Historical Provider, MD  latanoprost (XALATAN) 0.005 % ophthalmic solution Place 1 drop into both eyes at bedtime.    Historical Provider, MD  Liraglutide (VICTOZA [place]) Inject 18 mg into the skin daily.  Historical Provider, MD  losartan-hydrochlorothiazide (HYZAAR) 100-25 MG per tablet Take 1 tablet by mouth daily.    Historical Provider, MD  metFORMIN (GLUCOPHAGE) 1000 MG tablet Take 850 mg by mouth 3 (three) times daily.    Historical Provider, MD  metoprolol succinate (TOPROL-XL) 50 MG 24 hr tablet Take 50 mg by mouth daily. Take with or immediately following a meal.    Historical Provider, MD  potassium chloride SA (K-DUR,KLOR-CON) 20 MEQ tablet Take 20 mEq by mouth daily.     Historical Provider, MD  pravastatin (PRAVACHOL) 20 MG tablet Take 20 mg by mouth daily.    Historical Provider, MD  ranitidine (ZANTAC) 150 MG tablet Take 150 mg by mouth 2 (two) times daily.     Historical Provider, MD  solifenacin (VESICARE) 5 MG tablet Take 10 mg by mouth daily.    Historical Provider, MD  triamcinolone cream (KENALOG) 0.1 % Apply 1 application topically 2 (two) times daily.    Historical Provider, MD    Allergies  Allergen Reactions  . Procardia [Nifedipine] Other (See Comments)    Constipation, gum irritation    Social History:  reports that she has never smoked. She has never used smokeless tobacco. She reports that she does not drink alcohol or use illicit drugs.     Family History  Problem Relation Age of Onset  . Diabetes Mother   . Heart disease Mother   . Hypertension Mother   . Peripheral vascular disease Mother     amputation  . Diabetes Father   . Hypertension Father   . Varicose Veins Father   . Peripheral vascular disease Father     amputation  . Cancer Sister   . Diabetes Sister   . Hypertension Sister   . Diabetes Brother   . Hypertension Brother   . Peripheral vascular disease Brother     amputation     Physical Exam:  GEN:  Pleasant Obese 58 y.o. African American female  examined  and in no acute distress; cooperative with exam Filed Vitals:   04/01/13 2057  BP: 147/81  Pulse: 100  Temp: 99.2 F (37.3 C)  TempSrc: Oral  Resp: 18  SpO2: 98%   Blood pressure 147/81, pulse 100, temperature 99.2 F (37.3 C), temperature source Oral, resp. rate 18, SpO2 98.00%. PSYCH: SHe is alert and oriented x4; does not appear anxious does not appear depressed; affect is normal HEENT: Normocephalic and Atraumatic, Mucous membranes pink; PERRLA; EOM intact; Fundi:  Benign;  No scleral icterus, Nares: Patent, Oropharynx: Clear, Fair Dentition, Neck:  FROM, no cervical lymphadenopathy nor thyromegaly or carotid bruit; no JVD; Breasts:: Not  examined CHEST WALL: No tenderness CHEST: Normal respiration, clear to auscultation bilaterally HEART: Regular rate and rhythm; no murmurs rubs or gallops BACK: No kyphosis or scoliosis; no CVA tenderness ABDOMEN: Positive Bowel Sounds, Obese, soft non-tender; no masses, no organomegaly, no pannus; no intertriginous candida. Rectal Exam: Not done EXTREMITIES: No cyanosis, clubbing or edema or Ulcerations of the RLE;     +Ischemic changes of the Left 5th  Toe and dry gangrenous ulcer ( Quarter size) at the lateral aspect of the 5th MTP base.  + Left lateral Malleolar ulcer  Genitalia: not examined PULSES: 2+ and symmetric SKIN: Normal hydration no rash  CNS: Cranial nerves 2-12 grossly intact no focal neurologic deficit    Labs on Admission:  Basic Metabolic Panel:  Recent Labs Lab 03/29/13 1029  NA 137  K 4.1  CL 101  GLUCOSE 341*  BUN 27*  CREATININE 1.30*   Liver Function Tests: No results found for this basename: AST, ALT, ALKPHOS, BILITOT, PROT, ALBUMIN,  in the last 168 hours No results found for this basename: LIPASE, AMYLASE,  in the last 168 hours No results found for this basename: AMMONIA,  in the last 168 hours CBC:  Recent Labs Lab 03/29/13 1029  HGB 11.6*  HCT 34.0*   Cardiac Enzymes: No results found for this basename: CKTOTAL, CKMB, CKMBINDEX, TROPONINI,  in the last 168 hours  BNP (last 3 results) No results found for this basename: PROBNP,  in the last 8760 hours CBG:  Recent Labs Lab 03/29/13 1149 03/29/13 1237 04/01/13 2217  GLUCAP 318* 287* 147*    Radiological Exams on Admission: No results found.         Assessment/Plan Principal Problem:   Gas gangrene of foot Active Problems:   Diabetic osteomyelitis   Type II or unspecified type diabetes mellitus with neurological manifestations, not stated as uncontrolled(250.60)   Atherosclerosis of native arteries of the extremities with ulceration(440.23)   Unspecified essential  hypertension   CKD (chronic kidney disease), stage III   Anemia     1.   Gas Gangrene of Left Foot/diabtic Osteomyelitis/Left diabetic Foot Ulcers-  IV Vancomycin and Cefepime, and Wound Care for dressing Changes Daily.  Vascular Surgery Emily Shea to see in AM,    Plan for ABD Aortogram possibly Monday to evaluate for Bypass Grafting Potential of LLE, versus other surgical options.     2.   DM2-  Uncontrolled with sequelae,  Check HbA1C and Add SSI coverage PRN, monitor Glucose trends.  Diabetic Diet.  Hold Metformin due to upcoming IV Contrast Studies.     3.   Hyperlipidemia-  Continue Pravastatin Rx.    4.   HTN- on ARB Rx  (on Hold) and Amlodipine (continue), monitor BPs.     5.   CKD stage III- monitor BUN/Cr.    6.   Anemia-  Send Anemia panel.    7.   DVT prophylaxis with SCDs.        Code Status:   FULL CODE Family Communication:    No Family Present Disposition Plan:  Inpatient       Time spent:  32 Minutes  Ron Parker [facility] Pager [phone]  If 7PM-7AM, please contact night-coverage [email or web address] Password [facility] 04/01/2013, 10:26 PM

## 2013-04-02 ENCOUNTER — Encounter (HOSPITAL_COMMUNITY): Payer: Self-pay | Admitting: Orthopedic Surgery

## 2013-04-02 DIAGNOSIS — N183 Chronic kidney disease, stage 3 unspecified: Secondary | ICD-10-CM | POA: Diagnosis present

## 2013-04-02 DIAGNOSIS — D649 Anemia, unspecified: Secondary | ICD-10-CM | POA: Diagnosis present

## 2013-04-02 DIAGNOSIS — I70269 Atherosclerosis of native arteries of extremities with gangrene, unspecified extremity: Secondary | ICD-10-CM

## 2013-04-02 DIAGNOSIS — E1149 Type 2 diabetes mellitus with other diabetic neurological complication: Secondary | ICD-10-CM

## 2013-04-02 DIAGNOSIS — I10 Essential (primary) hypertension: Secondary | ICD-10-CM | POA: Diagnosis present

## 2013-04-02 LAB — CBC
HCT: 28 % — ABNORMAL LOW (ref 36.0–46.0)
Hemoglobin: 9.1 g/dL — ABNORMAL LOW (ref 12.0–15.0)
MCH: 28.1 pg (ref 26.0–34.0)
MCHC: 32.5 g/dL (ref 30.0–36.0)
MCV: 86.4 fL (ref 78.0–100.0)
PLATELETS: 320 10*3/uL (ref 150–400)
RBC: 3.24 MIL/uL — AB (ref 3.87–5.11)
RDW: 14.4 % (ref 11.5–15.5)
WBC: 16.1 10*3/uL — ABNORMAL HIGH (ref 4.0–10.5)

## 2013-04-02 LAB — BASIC METABOLIC PANEL
BUN: 19 mg/dL (ref 6–23)
CALCIUM: 8.9 mg/dL (ref 8.4–10.5)
CO2: 24 meq/L (ref 19–32)
Chloride: 97 mEq/L (ref 96–112)
Creatinine, Ser: 1.28 mg/dL — ABNORMAL HIGH (ref 0.50–1.10)
GFR calc Af Amer: 53 mL/min — ABNORMAL LOW (ref >90)
GFR calc non Af Amer: 45 mL/min — ABNORMAL LOW (ref >90)
GLUCOSE: 201 mg/dL — AB (ref 70–99)
POTASSIUM: 4.1 meq/L (ref 3.7–5.3)
SODIUM: 137 meq/L (ref 137–147)

## 2013-04-02 LAB — GLUCOSE, CAPILLARY
GLUCOSE-CAPILLARY: 186 mg/dL — AB (ref 70–99)
GLUCOSE-CAPILLARY: 224 mg/dL — AB (ref 70–99)
GLUCOSE-CAPILLARY: 314 mg/dL — AB (ref 70–99)
Glucose-Capillary: 369 mg/dL — ABNORMAL HIGH (ref 70–99)
Glucose-Capillary: 298 mg/dL — ABNORMAL HIGH (ref 70–99)

## 2013-04-02 LAB — HEMOGLOBIN A1C
HEMOGLOBIN A1C: 8.5 % — AB (ref <5.7)
Mean Plasma Glucose: 197 mg/dL — ABNORMAL HIGH (ref <117)

## 2013-04-02 MED ORDER — INSULIN ASPART 100 UNIT/ML ~~LOC~~ SOLN
0.0000 [IU] | Freq: Three times a day (TID) | SUBCUTANEOUS | Status: DC
  Administered 2013-04-02: 11 [IU] via SUBCUTANEOUS
  Administered 2013-04-02: 15 [IU] via SUBCUTANEOUS
  Administered 2013-04-03 (×2): 8 [IU] via SUBCUTANEOUS
  Administered 2013-04-03: 11 [IU] via SUBCUTANEOUS
  Administered 2013-04-04: 8 [IU] via SUBCUTANEOUS
  Administered 2013-04-04 – 2013-04-05 (×4): 5 [IU] via SUBCUTANEOUS
  Administered 2013-04-05 – 2013-04-06 (×3): 8 [IU] via SUBCUTANEOUS

## 2013-04-02 MED ORDER — GABAPENTIN 300 MG PO CAPS
300.0000 mg | ORAL_CAPSULE | Freq: Every day | ORAL | Status: DC
  Administered 2013-04-03 – 2013-04-06 (×4): 300 mg via ORAL
  Filled 2013-04-02 (×5): qty 1

## 2013-04-02 MED ORDER — INSULIN ASPART 100 UNIT/ML ~~LOC~~ SOLN
0.0000 [IU] | Freq: Every day | SUBCUTANEOUS | Status: DC
  Administered 2013-04-02 – 2013-04-04 (×3): 3 [IU] via SUBCUTANEOUS

## 2013-04-02 MED ORDER — ENSURE COMPLETE PO LIQD
237.0000 mL | Freq: Two times a day (BID) | ORAL | Status: DC
  Administered 2013-04-02: 237 mL via ORAL

## 2013-04-02 NOTE — Progress Notes (Signed)
Pt attempted to get out of bed to void. Two person assist to bsc. Pt was unable to help and required significant assistance to return to bed. She was very unstable and a very high risk for falling if she attempts to stand.

## 2013-04-02 NOTE — Progress Notes (Signed)
[facility] PROGRESS NOTE  RADHIKA DERSHEM ZOX:[id number] DOB: 09-Jun-1955 DOA: 04/01/2013 PCP: Estanislado Pandy, MD  Assessment/Plan: Gas Gangrene of Left Foot/diabtic Osteomyelitis/Left diabetic Foot Ulcers- IV Vancomycin and Cefepime, and Wound Care for dressing Changes Daily.  Vascular Surgery: Dr. Alcide Evener recs  Plan for ABD Aortogram possibly Monday to evaluate for Bypass Grafting Potential of LLE, versus other surgical options.  -had been held to to groin infection (yeast)  DM2- Uncontrolled   Check HbA1C  SSI coverage .  Diabetic Diet.  Hold Metformin due to upcoming IV Contrast Studies.  - may need low dose lantus   Hyperlipidemia- Continue Pravastatin Rx.   HTN- on ARB Rx (on Hold) and Amlodipine (continue), monitor BPs.   CKD stage III- monitor BUN/Cr.    Anemia- Send Anemia panel.   Leukocytosis -monitor  Code Status: full Family Communication: patient and sister Disposition Plan:    Consultants:  vascular  Procedures:    Antibiotics:  maxipime  vanc  HPI/Subjective: Feeling better No pain in leg  Objective: Filed Vitals:   04/02/13 0436  BP: 147/78  Pulse: 94  Temp: 98.7 F (37.1 C)  Resp: 18    Intake/Output Summary (Last 24 hours) at 04/02/13 0940 Last data filed at 04/02/13 0435  Gross per 24 hour  Intake    290 ml  Output      0 ml  Net    290 ml   Filed Weights   04/01/13 2100  Weight: 116.121 kg (256 lb)    Exam:   General:  Pleasant/cooperative  Cardiovascular: rrr  Respiratory: clear  Abdomen: +BS, soft  Musculoskeletal: left foot wrapped in gauze   Data Reviewed: Basic Metabolic Panel:  Recent Labs Lab 03/29/13 1029 04/02/13 0534  NA 137 137  K 4.1 4.1  CL 101 97  CO2  --  24  GLUCOSE 341* 201*  BUN 27* 19  CREATININE 1.30* 1.28*  CALCIUM  --  8.9   Liver Function Tests: No results found for this basename: AST, ALT, ALKPHOS, BILITOT, PROT, ALBUMIN,  in the last 168 hours No results found  for this basename: LIPASE, AMYLASE,  in the last 168 hours No results found for this basename: AMMONIA,  in the last 168 hours CBC:  Recent Labs Lab 03/29/13 1029 04/02/13 0534  WBC  --  16.1*  HGB 11.6* 9.1*  HCT 34.0* 28.0*  MCV  --  86.4  PLT  --  320   Cardiac Enzymes: No results found for this basename: CKTOTAL, CKMB, CKMBINDEX, TROPONINI,  in the last 168 hours BNP (last 3 results) No results found for this basename: PROBNP,  in the last 8760 hours CBG:  Recent Labs Lab 03/29/13 1149 03/29/13 1237 04/01/13 2217 04/02/13 0419 04/02/13 0804  GLUCAP 318* 287* 147* 186* 224*    No results found for this or any previous visit (from the past 240 hour(s)).   Studies: No results found.  Scheduled Meds: . amLODipine  2.5 mg Oral Daily  . ceFEPime (MAXIPIME) IV  1 g Intravenous Q12H  . darifenacin  15 mg Oral Daily  . famotidine  20 mg Oral BID  . ferrous sulfate  325 mg Oral Q breakfast  . fluconazole  200 mg Oral Daily  . gabapentin  300 mg Oral Daily  . losartan  100 mg Oral Daily   And  . hydrochlorothiazide  25 mg Oral Daily  . insulin aspart  0-9 Units Subcutaneous Q4H  . latanoprost  1 drop Both Eyes  QHS  . metoprolol succinate  50 mg Oral Q breakfast  . nystatin   Topical TID  . potassium chloride SA  20 mEq Oral Daily  . simvastatin  10 mg Oral q1800  . vancomycin  1,500 mg Intravenous Q24H   Continuous Infusions: . sodium chloride 75 mL/hr at 04/01/13 2310    Principal Problem:   Gas gangrene of foot Active Problems:   Diabetic osteomyelitis   Type II or unspecified type diabetes mellitus with neurological manifestations, not stated as uncontrolled(250.60)   Atherosclerosis of native arteries of the extremities with ulceration(440.23)   Unspecified essential hypertension   CKD (chronic kidney disease), stage III   Anemia    Time spent: 35 min   Burkley Dech  [facility] Pager [phone]. If 7PM-7AM, please contact night-coverage  at [email or web address], password [email or web address] 04/02/2013, 9:40 AM  LOS: 1 day

## 2013-04-02 NOTE — Consult Note (Signed)
Vascular Surgery Consultation  Reason for Consult: Progressive gangrene left foot  HPI: Emily Shea is a 58 y.o. female who presents for evaluation of progressive gangrene left foot. Patient was recently evaluated by Emily Shea with gangrenous changes left foot. Patient with history of diabetes mellitus. Ulceration left foot has been present for several months. Angiogram was scheduled for Emily Shea on December 30 but was canceled because of extensive yeast infection in right inguinal area. Angiogram was to be rescheduled but patient was admitted to [facility] for progressive gangrene left foot. Transferred yesterday to [facility].   Past Medical History  Diagnosis Date  . Diabetes mellitus without complication   . Chronic kidney disease   . GERD (gastroesophageal reflux disease)   . Stroke   . Hypertension   . Blindness   . Anemia   . DVT (deep venous thrombosis)    Past Surgical History  Procedure Laterality Date  . Appendectomy    . Lipoma excision      from L flank   History   Social History  . Marital Status: Divorced    Spouse Name: N/A    Number of Children: N/A  . Years of Education: N/A   Social History Main Topics  . Smoking status: Never Smoker   . Smokeless tobacco: Never Used  . Alcohol Use: No  . Drug Use: No  . Sexual Activity: Not on file   Other Topics Concern  . Not on file   Social History Narrative  . No narrative on file   Family History  Problem Relation Age of Onset  . Diabetes Mother   . Heart disease Mother   . Hypertension Mother   . Peripheral vascular disease Mother     amputation  . Diabetes Father   . Hypertension Father   . Varicose Veins Father   . Peripheral vascular disease Father     amputation  . Cancer Sister   . Diabetes Sister   . Hypertension Sister   . Diabetes Brother   . Hypertension Brother   . Peripheral vascular disease Brother     amputation   Allergies  Allergen Reactions  . Procardia  [Nifedipine] Other (See Comments)    Constipation, gum irritation   Prior to Admission medications   Medication Sig Start Date End Date Taking? Authorizing Provider  amLODipine (NORVASC) 2.5 MG tablet Take 2.5 mg by mouth daily.    Historical Provider, MD  aspirin 81 MG tablet Take 81 mg by mouth daily.    Historical Provider, MD  ciclopirox (PENLAC) 8 % solution Apply 1 application topically at bedtime. Apply over nail and surrounding skin. Apply daily over previous coat. After seven (7) days, may remove with alcohol and continue cycle.    Historical Provider, MD  ferrous sulfate 325 (65 FE) MG tablet Take 325 mg by mouth daily with breakfast.    Historical Provider, MD  gabapentin (NEURONTIN) 300 MG capsule Take 300 mg by mouth daily.    Historical Provider, MD  HYDROcodone-acetaminophen (NORCO) 10-325 MG per tablet Take 1 tablet by mouth every 6 (six) hours as needed for moderate pain.     Historical Provider, MD  latanoprost (XALATAN) 0.005 % ophthalmic solution Place 1 drop into both eyes at bedtime.    Historical Provider, MD  Liraglutide (VICTOZA [place]) Inject 18 mg into the skin daily.    Historical Provider, MD  losartan-hydrochlorothiazide (HYZAAR) 100-25 MG per tablet Take 1 tablet by mouth daily.    Historical  Provider, MD  metFORMIN (GLUCOPHAGE) 1000 MG tablet Take 850 mg by mouth 3 (three) times daily.    Historical Provider, MD  metoprolol succinate (TOPROL-XL) 50 MG 24 hr tablet Take 50 mg by mouth daily. Take with or immediately following a meal.    Historical Provider, MD  potassium chloride SA (K-DUR,KLOR-CON) 20 MEQ tablet Take 20 mEq by mouth daily.    Historical Provider, MD  pravastatin (PRAVACHOL) 20 MG tablet Take 20 mg by mouth daily.    Historical Provider, MD  ranitidine (ZANTAC) 150 MG tablet Take 150 mg by mouth 2 (two) times daily.     Historical Provider, MD  solifenacin (VESICARE) 5 MG tablet Take 10 mg by mouth daily.    Historical Provider, MD  triamcinolone cream  (KENALOG) 0.1 % Apply 1 application topically 2 (two) times daily.    Historical Provider, MD     Positive ROS: Denies active chest pain or dyspnea on exertion  All other systems have been reviewed and were otherwise negative with the exception of those mentioned in the HPI and as above.  Physical Exam: Filed Vitals:   04/02/13 0436  BP: 147/78  Pulse: 94  Temp: 98.7 F (37.1 C)  Resp: 18    General: Alert, no acute distress HEENT: Normal for age Cardiovascular: Regular rate and rhythm. Carotid pulses 2+, no bruits audible Respiratory: Clear to auscultation. No cyanosis, no use of accessory musculature GI: No organomegaly, abdomen is soft and non-tender Skin: No lesions in the area of chief complaint Neurologic: Sensation intact distally Psychiatric: Patient is competent for consent with normal mood and affect Musculoskeletal: No obvious deformities Extremities: Left lower extremity with 3+ femoral pulse palpable. No popliteal or distal pulses palpable. Extensive gangrene lateral left foot involving fifth toe and progressing proximally and anteriorly back to the proximal third of left foot. Right leg with 3+ femoral pulse. No active ulceration right lower extremity.     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 Even with   successful revascularization which is very questionable that it would be possible Left foot will not heal because of extensive nature of gangrene Patient needs left leg amputation-BKA versus AKA  Discussed this with patient and she is agreeable to proceed Will schedule this for Monday for Emily Shea  Emily GipJames Yumalay Circle, MD 04/02/2013 10:03 AM

## 2013-04-02 NOTE — Progress Notes (Signed)
INITIAL NUTRITION ASSESSMENT  DOCUMENTATION CODES Per approved criteria  -Obesity Unspecified   INTERVENTION: Ensure Complete po BID, each supplement provides 350 kcal and 13 grams of protein  NUTRITION DIAGNOSIS: Inadequate oral intake related to acute illness as evidenced by meal completion <25%.   Goal: Pt to meet >/= 90% of their estimated nutrition needs   Monitor:  PO intake, wound healing, weight, labs  Reason for Assessment: Pt identified as at nutrition risk on the Malnutrition Screen Tool  58 y.o. female  Admitting Dx: Gas gangrene of foot  ASSESSMENT: Pt transferred from [facility] due to progressing left food gangrene. 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  Per pt she has not had any recent weight loss however appetite is currently decreased. Pt with lunch at bedside and had ate < 25% of her meal. Pt is willing to try ensure while her appetite is decreased for better healing post op.  Nutrition-focused physical exam WNL.   Height: Ht Readings from Last 1 Encounters:  04/01/13 5\' 11"  (1.803 m)    Weight: Wt Readings from Last 1 Encounters:  04/01/13 256 lb (116.121 kg)    Ideal Body Weight: 70.4 kg  % Ideal Body Weight: 165%  Wt Readings from Last 10 Encounters:  04/01/13 256 lb (116.121 kg)  03/29/13 256 lb (116.121 kg)  03/29/13 256 lb (116.121 kg)  03/22/13 256 lb 12.8 oz (116.484 kg)  10/26/12 259 lb (117.482 kg)  09/23/12 259 lb (117.482 kg)  09/09/12 248 lb (112.492 kg)    Usual Body Weight: 259 lb   % Usual Body Weight: 99%  BMI:  Body mass index is 35.72 kg/(m^2).  Estimated Nutritional Needs: Kcal: 2100-2300 Protein: 100-115 grams Fluid: > 2.1 L/day  Skin:  L foot ulcer with gangrene   Diet Order: Carb Control  EDUCATION NEEDS: -No  education needs identified at this time   Intake/Output Summary (Last 24 hours) at 04/02/13 1013 Last data filed at 04/02/13 0435  Gross per 24 hour  Intake    290 ml  Output      0 ml  Net    290 ml    Last BM: 1/3   Labs:   Recent Labs Lab 03/29/13 1029 04/02/13 0534  NA 137 137  K 4.1 4.1  CL 101 97  CO2  --  24  BUN 27* 19  CREATININE 1.30* 1.28*  CALCIUM  --  8.9  GLUCOSE 341* 201*    CBG (last 3)   Recent Labs  04/01/13 2217 04/02/13 0419 04/02/13 0804  GLUCAP 147* 186* 224*   No results found for this basename: HGBA1C   Scheduled Meds: . amLODipine  2.5 mg Oral Daily  . ceFEPime (MAXIPIME) IV  1 g Intravenous Q12H  . darifenacin  15 mg Oral Daily  . famotidine  20 mg Oral BID  . ferrous sulfate  325 mg Oral Q breakfast  . fluconazole  200 mg Oral Daily  . gabapentin  300 mg Oral Daily  . losartan  100 mg Oral Daily   And  . hydrochlorothiazide  25 mg Oral Daily  . insulin aspart  0-15 Units Subcutaneous TID WC  . insulin aspart  0-5 Units Subcutaneous QHS  . latanoprost  1 drop Both Eyes QHS  . metoprolol succinate  50 mg Oral Q  breakfast  . nystatin   Topical TID  . potassium chloride SA  20 mEq Oral Daily  . simvastatin  10 mg Oral q1800  . vancomycin  1,500 mg Intravenous Q24H    Continuous Infusions: . sodium chloride 75 mL/hr at 04/01/13 2310    Past Medical History  Diagnosis Date  . Diabetes mellitus without complication   . Chronic kidney disease   . GERD (gastroesophageal reflux disease)   . Stroke   . Hypertension   . Blindness   . Anemia   . DVT (deep venous thrombosis)     Past Surgical History  Procedure Laterality Date  . Appendectomy    . Lipoma excision      from L flank    Kendell BaneHeather Evolett Somarriba RD, LDN, CNSC [phone] Pager [phone] After Hours Pager

## 2013-04-03 LAB — TYPE AND SCREEN
ABO/RH(D): B POS
Antibody Screen: NEGATIVE

## 2013-04-03 LAB — CBC
HEMATOCRIT: 26.7 % — AB (ref 36.0–46.0)
Hemoglobin: 8.7 g/dL — ABNORMAL LOW (ref 12.0–15.0)
MCH: 28 pg (ref 26.0–34.0)
MCHC: 32.6 g/dL (ref 30.0–36.0)
MCV: 85.9 fL (ref 78.0–100.0)
Platelets: 315 10*3/uL (ref 150–400)
RBC: 3.11 MIL/uL — ABNORMAL LOW (ref 3.87–5.11)
RDW: 14.6 % (ref 11.5–15.5)
WBC: 16 10*3/uL — AB (ref 4.0–10.5)

## 2013-04-03 LAB — BASIC METABOLIC PANEL
BUN: 20 mg/dL (ref 6–23)
CALCIUM: 8.4 mg/dL (ref 8.4–10.5)
CHLORIDE: 101 meq/L (ref 96–112)
CO2: 20 meq/L (ref 19–32)
Creatinine, Ser: 1.36 mg/dL — ABNORMAL HIGH (ref 0.50–1.10)
GFR calc Af Amer: 49 mL/min — ABNORMAL LOW (ref >90)
GFR calc non Af Amer: 42 mL/min — ABNORMAL LOW (ref >90)
GLUCOSE: 279 mg/dL — AB (ref 70–99)
Potassium: 4 mEq/L (ref 3.7–5.3)
Sodium: 137 mEq/L (ref 137–147)

## 2013-04-03 LAB — IRON AND TIBC
IRON: 14 ug/dL — AB (ref 42–135)
Saturation Ratios: 9 % — ABNORMAL LOW (ref 20–55)
TIBC: 152 ug/dL — AB (ref 250–470)
UIBC: 138 ug/dL (ref 125–400)

## 2013-04-03 LAB — GLUCOSE, CAPILLARY
GLUCOSE-CAPILLARY: 313 mg/dL — AB (ref 70–99)
GLUCOSE-CAPILLARY: 294 mg/dL — AB (ref 70–99)
Glucose-Capillary: 283 mg/dL — ABNORMAL HIGH (ref 70–99)
Glucose-Capillary: 284 mg/dL — ABNORMAL HIGH (ref 70–99)

## 2013-04-03 LAB — FERRITIN: FERRITIN: 874 ng/mL — AB (ref 10–291)

## 2013-04-03 LAB — PROTIME-INR
INR: 1.32 (ref 0.00–1.49)
Prothrombin Time: 16.1 seconds — ABNORMAL HIGH (ref 11.6–15.2)

## 2013-04-03 LAB — ABO/RH: ABO/RH(D): B POS

## 2013-04-03 MED ORDER — INSULIN GLARGINE 100 UNIT/ML ~~LOC~~ SOLN
10.0000 [IU] | Freq: Every day | SUBCUTANEOUS | Status: DC
  Administered 2013-04-03 – 2013-04-05 (×2): 10 [IU] via SUBCUTANEOUS
  Filled 2013-04-03 (×3): qty 0.1

## 2013-04-03 NOTE — Progress Notes (Signed)
[facility] PROGRESS NOTE  Emily Shea Frankie GNF:[id number] DOB: 1955-12-11 DOA: 04/01/2013 PCP: [name],PAUL W, MD  Emily Shea Emily Shea is a 58 y.o. female who was [facility] [place] and was transferred to [facility] for further evalauation and treatment of an Ischemic and gangrenous Left Diabetic Foot ulcer. Patient reports that the ulcer began several months about 1 month ago as a blister on the side of her left foot and 5th toe and an ulcer on the left lateral ankle. The ulcer of her toe worsened over that time period and began to turn black and drain a foul smelling drainage. She denies having any fevers or chills. She was evaluated by Vascular Dr. [name] on 03/22/2013, and by Dr. [name] on 03/30/2013 For an ABD aortogram procedure to evaluated for Bypass Grafting potential of her Left Lower Extremity. The procedure was postponed so that she could receive treatment for an intertriginous candidal infection of her groin area and she was started on Diflucan and Nystatin Powder TID.    Assessment/Plan: Gas Gangrene of Left Foot/diabtic Osteomyelitis/Left diabetic Foot Ulcers- IV Vancomycin and Cefepime, and Wound Care for dressing Changes Daily.  Vascular Surgery: Dr. Adelene IdlerLawson-Patient needs left leg amputation-BKA versus AKA   DM2- Uncontrolled   HbA1C- 8.5 SSI coverage Add lantus while in hospital  Diabetic Diet.  Hold Metformin due to upcoming IV Contrast Studies.    Hyperlipidemia- Continue Pravastatin Rx.   HTN- on ARB Rx (on Hold) and Amlodipine (continue), monitor BPs.   CKD stage III- monitor BUN/Cr.    Anemia-  Send panel  -? Chronic disease Transfuse for < 7  Leukocytosis -monitor  Code Status: full Family Communication: patient this AM, sister yesterday Disposition Plan: WILL NEED PT/OT after surgery   Consultants:  vascular  Procedures:    Antibiotics:  maxipime  vanc  HPI/Subjective: Tired this AM No SOB, no CP  Objective: Filed  Vitals:   04/03/13 0631  BP: 149/74  Pulse: 85  Temp: 99.8 F (37.7 C)  Resp: 18    Intake/Output Summary (Last 24 hours) at 04/03/13 0726 Last data filed at 04/03/13 [id number]  Gross per 24 hour  Intake   1200 ml  Output      0 ml  Net   1200 ml   Filed Weights   04/01/13 2100  Weight: 116.121 kg (256 lb)    Exam:   General:  Pleasant/cooperative  Cardiovascular: rrr  Respiratory: clear  Abdomen: +BS, soft  Musculoskeletal: left foot wrapped in gauze   Data Reviewed: Basic Metabolic Panel:  Recent Labs Lab 03/29/13 1029 04/02/13 0534  NA 137 137  K 4.1 4.1  CL 101 97  CO2  --  24  GLUCOSE 341* 201*  BUN 27* 19  CREATININE 1.30* 1.28*  CALCIUM  --  8.9   Liver Function Tests: No results found for this basename: AST, ALT, ALKPHOS, BILITOT, PROT, ALBUMIN,  in the last 168 hours No results found for this basename: LIPASE, AMYLASE,  in the last 168 hours No results found for this basename: AMMONIA,  in the last 168 hours CBC:  Recent Labs Lab 03/29/13 1029 04/02/13 0534  WBC  --  16.1*  HGB 11.6* 9.1*  HCT 34.0* 28.0*  MCV  --  86.4  PLT  --  320   Cardiac Enzymes: No results found for this basename: CKTOTAL, CKMB, CKMBINDEX, TROPONINI,  in the last 168 hours BNP (last 3 results) No results found for this basename: PROBNP,  in the last 8760 hours CBG:  Recent Labs Lab 04/02/13 0419 04/02/13 0804 04/02/13 1238 04/02/13 1606 04/02/13 2232  GLUCAP 186* 224* 314* 369* 298*    No results found for this or any previous visit (from the past 240 hour(s)).   Studies: No results found.  Scheduled Meds: . amLODipine  2.5 mg Oral Daily  . ceFEPime (MAXIPIME) IV  1 g Intravenous Q12H  . darifenacin  15 mg Oral Daily  . famotidine  20 mg Oral BID  . ferrous sulfate  325 mg Oral Q breakfast  . fluconazole  200 mg Oral Daily  . gabapentin  300 mg Oral QHS  . losartan  100 mg Oral Daily   And  . hydrochlorothiazide  25 mg Oral Daily  . insulin  aspart  0-15 Units Subcutaneous TID WC  . insulin aspart  0-5 Units Subcutaneous QHS  . latanoprost  1 drop Both Eyes QHS  . metoprolol succinate  50 mg Oral Q breakfast  . nystatin   Topical TID  . potassium chloride SA  20 mEq Oral Daily  . simvastatin  10 mg Oral q1800  . vancomycin  1,500 mg Intravenous Q24H   Continuous Infusions: . sodium chloride 75 mL/hr at 04/03/13 0400    Principal Problem:   Gas gangrene of foot Active Problems:   Diabetic osteomyelitis   Type II or unspecified type diabetes mellitus with neurological manifestations, not stated as uncontrolled(250.60)   Atherosclerosis of native arteries of the extremities with ulceration(440.23)   Unspecified essential hypertension   CKD (chronic kidney disease), stage III   Anemia    Time spent: 35 min   Breckan Cafiero  [facility] Pager [phone]. If 7PM-7AM, please contact night-coverage at [email or web address], password [facility] 04/03/2013, 7:26 AM  LOS: 2 days

## 2013-04-03 NOTE — Progress Notes (Signed)
Patient ID: Emily Shea, female   DOB: Oct 20, 1955, 58 y.o.   MRN: [id number] Vascular Surgery Progress Note  Subjective: Gangrene left foot-extensive-4 left BKA versus AKA in a.m. Patient with diabetes mellitus and severe tibial occlusive disease  Objective:  Filed Vitals:   04/03/13 0631  BP: 149/74  Pulse: 85  Temp: 99.8 F (37.7 C)  Resp: 18    General alert and oriented x3 no apparent distress Lungs no rhonchi or wheezing Left leg 3+ femoral pulse absent distal pulses and extensive gangrene lateral foot   Labs:  Recent Labs Lab 03/29/13 1029 04/02/13 0534 04/03/13 0610  CREATININE 1.30* 1.28* 1.36*    Recent Labs Lab 03/29/13 1029 04/02/13 0534 04/03/13 0610  NA 137 137 137  K 4.1 4.1 4.0  CL 101 97 101  CO2  --  24 20  BUN 27* 19 20  CREATININE 1.30* 1.28* 1.36*  GLUCOSE 341* 201* 279*  CALCIUM  --  8.9 8.4    Recent Labs Lab 03/29/13 1029 04/02/13 0534 04/03/13 0610  WBC  --  16.1* 16.0*  HGB 11.6* 9.1* 8.7*  HCT 34.0* 28.0* 26.7*  PLT  --  320 315    Recent Labs Lab 04/03/13 0610  INR 1.32    I/O last 3 completed shifts: In: 1490 [P.O.:1440; Other:50] Out: -   Imaging: No results found.  Assessment/Plan:    LOS: 2 days  s/p Procedure(s): AMPUTATION BELOW KNEE AMPUTATION ABOVE KNEE  4 left BKA versus AKA tomorrow per Dr. early or fields Hematocrit 27%-will have 2 units of blood available if needed Discuss surgery with the patient and she is agreeable and ready to proceed   Emily GipJames Joeann Steppe, MD 04/03/2013 9:20 AM

## 2013-04-04 ENCOUNTER — Inpatient Hospital Stay (HOSPITAL_COMMUNITY): Payer: Medicare FFS | Admitting: Certified Registered Nurse Anesthetist

## 2013-04-04 ENCOUNTER — Encounter (HOSPITAL_COMMUNITY): Payer: Medicare FFS | Admitting: Certified Registered Nurse Anesthetist

## 2013-04-04 ENCOUNTER — Encounter (HOSPITAL_COMMUNITY)
Admission: AD | Disposition: A | Discharge status: Skilled Nursing Facility | Payer: Self-pay | Source: Other Acute Inpatient Hospital | Attending: Internal Medicine

## 2013-04-04 ENCOUNTER — Encounter (HOSPITAL_COMMUNITY): Payer: Self-pay | Admitting: Critical Care Medicine

## 2013-04-04 HISTORY — PX: AMPUTATION: SHX166

## 2013-04-04 LAB — CREATININE, SERUM
CREATININE: 1.36 mg/dL — AB (ref 0.50–1.10)
GFR, EST AFRICAN AMERICAN: 49 mL/min — AB (ref >90)
GFR, EST NON AFRICAN AMERICAN: 42 mL/min — AB (ref >90)

## 2013-04-04 LAB — SURGICAL PCR SCREEN
MRSA, PCR: NEGATIVE
STAPHYLOCOCCUS AUREUS: NEGATIVE

## 2013-04-04 LAB — CBC
HCT: 24.7 % — ABNORMAL LOW (ref 36.0–46.0)
HCT: 24.7 % — ABNORMAL LOW (ref 36.0–46.0)
HEMOGLOBIN: 8.1 g/dL — AB (ref 12.0–15.0)
Hemoglobin: 8 g/dL — ABNORMAL LOW (ref 12.0–15.0)
MCH: 27.5 pg (ref 26.0–34.0)
MCH: 28 pg (ref 26.0–34.0)
MCHC: 32.4 g/dL (ref 30.0–36.0)
MCHC: 32.8 g/dL (ref 30.0–36.0)
MCV: 84.9 fL (ref 78.0–100.0)
MCV: 85.5 fL (ref 78.0–100.0)
PLATELETS: 329 10*3/uL (ref 150–400)
Platelets: 308 10*3/uL (ref 150–400)
RBC: 2.91 MIL/uL — ABNORMAL LOW (ref 3.87–5.11)
RBC: 2.89 MIL/uL — AB (ref 3.87–5.11)
RDW: 14.6 % (ref 11.5–15.5)
RDW: 15 % (ref 11.5–15.5)
WBC: 16.1 10*3/uL — AB (ref 4.0–10.5)
WBC: 15.6 10*3/uL — AB (ref 4.0–10.5)

## 2013-04-04 LAB — BASIC METABOLIC PANEL
BUN: 18 mg/dL (ref 6–23)
CO2: 21 mEq/L (ref 19–32)
CREATININE: 1.37 mg/dL — AB (ref 0.50–1.10)
Calcium: 8.4 mg/dL (ref 8.4–10.5)
Chloride: 100 mEq/L (ref 96–112)
GFR calc non Af Amer: 42 mL/min — ABNORMAL LOW (ref >90)
GFR, EST AFRICAN AMERICAN: 49 mL/min — AB (ref >90)
Glucose, Bld: 280 mg/dL — ABNORMAL HIGH (ref 70–99)
POTASSIUM: 4 meq/L (ref 3.7–5.3)
SODIUM: 137 meq/L (ref 137–147)

## 2013-04-04 LAB — GLUCOSE, CAPILLARY
GLUCOSE-CAPILLARY: 274 mg/dL — AB (ref 70–99)
GLUCOSE-CAPILLARY: 219 mg/dL — AB (ref 70–99)
GLUCOSE-CAPILLARY: 216 mg/dL — AB (ref 70–99)
GLUCOSE-CAPILLARY: 285 mg/dL — AB (ref 70–99)
Glucose-Capillary: 235 mg/dL — ABNORMAL HIGH (ref 70–99)
Glucose-Capillary: 228 mg/dL — ABNORMAL HIGH (ref 70–99)

## 2013-04-04 LAB — VANCOMYCIN, TROUGH: Vancomycin Tr: 32.7 ug/mL (ref 10.0–20.0)

## 2013-04-04 SURGERY — AMPUTATION BELOW KNEE
Anesthesia: General

## 2013-04-04 MED ORDER — ENOXAPARIN SODIUM 40 MG/0.4ML ~~LOC~~ SOLN
40.0000 mg | SUBCUTANEOUS | Status: DC
  Administered 2013-04-05 – 2013-04-06 (×3): 40 mg via SUBCUTANEOUS
  Filled 2013-04-04 (×4): qty 0.4

## 2013-04-04 MED ORDER — LIDOCAINE HCL (CARDIAC) 20 MG/ML IV SOLN: INTRAVENOUS | Status: DC | PRN

## 2013-04-04 MED ORDER — NEOSTIGMINE METHYLSULFATE 1 MG/ML IJ SOLN
INTRAMUSCULAR | Status: DC | PRN
  Administered 2013-04-04: 4 mg via INTRAVENOUS

## 2013-04-04 MED ORDER — 0.9 % SODIUM CHLORIDE (POUR BTL) OPTIME
TOPICAL | Status: DC | PRN
  Administered 2013-04-04: 1000 mL

## 2013-04-04 MED ORDER — NYSTATIN 100000 UNIT/GM EX POWD
Freq: Every day | CUTANEOUS | Status: DC | PRN
  Filled 2013-04-04: qty 15

## 2013-04-04 MED ORDER — METFORMIN HCL 850 MG PO TABS
850.0000 mg | ORAL_TABLET | Freq: Three times a day (TID) | ORAL | Status: DC
  Filled 2013-04-04: qty 1

## 2013-04-04 MED ORDER — PHENOL 1.4 % MT LIQD: 1.0000 | OROMUCOSAL | Status: DC | PRN

## 2013-04-04 MED ORDER — LABETALOL HCL 5 MG/ML IV SOLN
10.0000 mg | INTRAVENOUS | Status: DC | PRN
  Administered 2013-04-06 (×2): 10 mg via INTRAVENOUS
  Filled 2013-04-04 (×3): qty 4

## 2013-04-04 MED ORDER — ARTIFICIAL TEARS OP OINT
TOPICAL_OINTMENT | OPHTHALMIC | Status: DC | PRN
  Administered 2013-04-04: 1 via OPHTHALMIC

## 2013-04-04 MED ORDER — DOCUSATE SODIUM 100 MG PO CAPS
100.0000 mg | ORAL_CAPSULE | Freq: Every day | ORAL | Status: DC
  Administered 2013-04-05 – 2013-04-07 (×3): 100 mg via ORAL
  Filled 2013-04-04 (×3): qty 1

## 2013-04-04 MED ORDER — OXYCODONE HCL 5 MG/5ML PO SOLN: 5.0000 mg | Freq: Once | ORAL | Status: DC | PRN

## 2013-04-04 MED ORDER — MAGNESIUM SULFATE 40 MG/ML IJ SOLN
2.0000 g | Freq: Every day | INTRAMUSCULAR | Status: DC | PRN
  Filled 2013-04-04: qty 50

## 2013-04-04 MED ORDER — LACTATED RINGERS IV SOLN
INTRAVENOUS | Status: DC | PRN
  Administered 2013-04-04 (×2): via INTRAVENOUS

## 2013-04-04 MED ORDER — PANTOPRAZOLE SODIUM 40 MG PO TBEC
40.0000 mg | DELAYED_RELEASE_TABLET | Freq: Every day | ORAL | Status: DC
  Administered 2013-04-04 – 2013-04-07 (×4): 40 mg via ORAL
  Filled 2013-04-04 (×4): qty 1

## 2013-04-04 MED ORDER — HYDRALAZINE HCL 20 MG/ML IJ SOLN: 10.0000 mg | INTRAMUSCULAR | Status: DC | PRN

## 2013-04-04 MED ORDER — ONDANSETRON HCL 4 MG/2ML IJ SOLN
INTRAMUSCULAR | Status: DC | PRN
  Administered 2013-04-04: 4 mg via INTRAVENOUS

## 2013-04-04 MED ORDER — MIDAZOLAM HCL 5 MG/5ML IJ SOLN
INTRAMUSCULAR | Status: DC | PRN
  Administered 2013-04-04: 1 mg via INTRAVENOUS

## 2013-04-04 MED ORDER — HYDRALAZINE HCL 25 MG PO TABS
25.0000 mg | ORAL_TABLET | Freq: Four times a day (QID) | ORAL | Status: DC | PRN
  Administered 2013-04-05 – 2013-04-06 (×2): 25 mg via ORAL
  Filled 2013-04-04 (×3): qty 1

## 2013-04-04 MED ORDER — METOPROLOL TARTRATE 1 MG/ML IV SOLN
2.0000 mg | INTRAVENOUS | Status: AC | PRN
  Administered 2013-04-05 (×2): 2.5 mg via INTRAVENOUS
  Filled 2013-04-04: qty 5

## 2013-04-04 MED ORDER — SODIUM CHLORIDE 0.9 % IV SOLN: INTRAVENOUS | Status: DC

## 2013-04-04 MED ORDER — GLYCOPYRROLATE 0.2 MG/ML IJ SOLN
INTRAMUSCULAR | Status: DC | PRN
  Administered 2013-04-04: 0.6 mg via INTRAVENOUS

## 2013-04-04 MED ORDER — ONDANSETRON HCL 4 MG/2ML IJ SOLN: 4.0000 mg | Freq: Four times a day (QID) | INTRAMUSCULAR | Status: DC | PRN

## 2013-04-04 MED ORDER — FLUCONAZOLE 200 MG PO TABS
200.0000 mg | ORAL_TABLET | Freq: Every day | ORAL | Status: DC
  Administered 2013-04-04 – 2013-04-07 (×4): 200 mg via ORAL
  Filled 2013-04-04 (×4): qty 1

## 2013-04-04 MED ORDER — POTASSIUM CHLORIDE CRYS ER 20 MEQ PO TBCR: 20.0000 meq | EXTENDED_RELEASE_TABLET | Freq: Every day | ORAL | Status: DC | PRN

## 2013-04-04 MED ORDER — LIDOCAINE HCL (CARDIAC) 20 MG/ML IV SOLN
INTRAVENOUS | Status: DC | PRN
  Administered 2013-04-04: 100 mg via INTRAVENOUS

## 2013-04-04 MED ORDER — HYDROMORPHONE HCL PF 1 MG/ML IJ SOLN: 0.2500 mg | INTRAMUSCULAR | Status: DC | PRN

## 2013-04-04 MED ORDER — GUAIFENESIN-DM 100-10 MG/5ML PO SYRP
15.0000 mL | ORAL_SOLUTION | ORAL | Status: DC | PRN
  Filled 2013-04-04: qty 15

## 2013-04-04 MED ORDER — PROPOFOL 10 MG/ML IV BOLUS
INTRAVENOUS | Status: DC | PRN
  Administered 2013-04-04: 170 mg via INTRAVENOUS

## 2013-04-04 MED ORDER — OXYCODONE HCL 5 MG PO TABS: 5.0000 mg | ORAL_TABLET | Freq: Once | ORAL | Status: DC | PRN

## 2013-04-04 MED ORDER — FENTANYL CITRATE 0.05 MG/ML IJ SOLN
INTRAMUSCULAR | Status: DC | PRN
  Administered 2013-04-04 (×5): 50 ug via INTRAVENOUS

## 2013-04-04 MED ORDER — ROCURONIUM BROMIDE 100 MG/10ML IV SOLN
INTRAVENOUS | Status: DC | PRN
  Administered 2013-04-04: 50 mg via INTRAVENOUS

## 2013-04-04 SURGICAL SUPPLY — 47 items
BANDAGE ELASTIC 4 VELCRO ST LF (GAUZE/BANDAGES/DRESSINGS) ×3 IMPLANT
BANDAGE ELASTIC 6 VELCRO ST LF (GAUZE/BANDAGES/DRESSINGS) ×3 IMPLANT
BANDAGE ESMARK 6X9 LF (GAUZE/BANDAGES/DRESSINGS) IMPLANT
BANDAGE GAUZE ELAST BULKY 4 IN (GAUZE/BANDAGES/DRESSINGS) ×3 IMPLANT
BNDG ESMARK 6X9 LF (GAUZE/BANDAGES/DRESSINGS)
CANISTER SUCTION 2500CC (MISCELLANEOUS) ×3 IMPLANT
CLIP LIGATING EXTRA MED SLVR (CLIP) ×3 IMPLANT
CLIP LIGATING EXTRA SM BLUE (MISCELLANEOUS) ×3 IMPLANT
COVER SURGICAL LIGHT HANDLE (MISCELLANEOUS) ×3 IMPLANT
CUFF TOURNIQUET SINGLE 34IN LL (TOURNIQUET CUFF) IMPLANT
CUFF TOURNIQUET SINGLE 44IN (TOURNIQUET CUFF) IMPLANT
DRAIN SNY 10X20 3/4 PERF (WOUND CARE) IMPLANT
DRAPE ORTHO SPLIT 77X108 STRL (DRAPES) ×4
DRAPE PROXIMA HALF (DRAPES) ×3 IMPLANT
DRAPE SURG ORHT 6 SPLT 77X108 (DRAPES) ×2 IMPLANT
ELECT REM PT RETURN 9FT ADLT (ELECTROSURGICAL) ×3
ELECTRODE REM PT RTRN 9FT ADLT (ELECTROSURGICAL) ×1 IMPLANT
EVACUATOR SILICONE 100CC (DRAIN) IMPLANT
GAUZE XEROFORM 5X9 LF (GAUZE/BANDAGES/DRESSINGS) ×3 IMPLANT
GLOVE BIOGEL PI IND STRL 6.5 (GLOVE) ×1 IMPLANT
GLOVE BIOGEL PI INDICATOR 6.5 (GLOVE) ×2
GLOVE SS BIOGEL STRL SZ 7.5 (GLOVE) ×1 IMPLANT
GLOVE SUPERSENSE BIOGEL SZ 7.5 (GLOVE) ×2
GLOVE SURG SS PI 6.0 STRL IVOR (GLOVE) ×3 IMPLANT
GLOVE SURG SS PI 7.0 STRL IVOR (GLOVE) ×3 IMPLANT
GOWN STRL NON-REIN LRG LVL3 (GOWN DISPOSABLE) ×6 IMPLANT
GOWN STRL REIN XL XLG (GOWN DISPOSABLE) ×3 IMPLANT
KIT BASIN OR (CUSTOM PROCEDURE TRAY) ×3 IMPLANT
KIT ROOM TURNOVER OR (KITS) ×3 IMPLANT
NS IRRIG 1000ML POUR BTL (IV SOLUTION) ×3 IMPLANT
PACK GENERAL/GYN (CUSTOM PROCEDURE TRAY) ×3 IMPLANT
PAD ARMBOARD 7.5X6 YLW CONV (MISCELLANEOUS) ×6 IMPLANT
PADDING CAST COTTON 6X4 STRL (CAST SUPPLIES) IMPLANT
SAW GIGLI STERILE 20 (MISCELLANEOUS) ×3 IMPLANT
SPONGE GAUZE 4X4 12PLY (GAUZE/BANDAGES/DRESSINGS) ×6 IMPLANT
SPONGE GAUZE 4X4 12PLY STER LF (GAUZE/BANDAGES/DRESSINGS) ×3 IMPLANT
STAPLER VISISTAT 35W (STAPLE) ×3 IMPLANT
STOCKINETTE IMPERVIOUS LG (DRAPES) ×3 IMPLANT
SUT ETHILON 3 0 PS 1 (SUTURE) IMPLANT
SUT VIC AB 0 CT1 18XCR BRD 8 (SUTURE) ×2 IMPLANT
SUT VIC AB 0 CT1 8-18 (SUTURE) ×4
SUT VICRYL AB 2 0 TIES (SUTURE) ×3 IMPLANT
TOWEL OR 17X24 6PK STRL BLUE (TOWEL DISPOSABLE) ×6 IMPLANT
TOWEL OR 17X26 10 PK STRL BLUE (TOWEL DISPOSABLE) ×3 IMPLANT
UNDERPAD 30X30 INCONTINENT (UNDERPADS AND DIAPERS) ×3 IMPLANT
WATER STERILE IRR 1000ML POUR (IV SOLUTION) ×3 IMPLANT
YANKAUER SUCT BULB TIP NO VENT (SUCTIONS) ×3 IMPLANT

## 2013-04-04 NOTE — Preoperative (Signed)
Beta Blockers   Reason not to administer Beta Blockers:Not Applicable toprol 04/04/13 @1106 

## 2013-04-04 NOTE — Progress Notes (Signed)
Paged MD re; Vancomycin critical lab value of 32.7, per MD request, notified pharmacy[facility] hold Vanc dose and she will write new orders.  Nsg to continue to monitor, will report to second shift RN.

## 2013-04-04 NOTE — Progress Notes (Signed)
Report given to OR RN Marcelino DusterMichelle, re; medications held and given, vanc trough order, VS, CBG and labs.

## 2013-04-04 NOTE — H&P (View-Only) (Signed)
Vascular Surgery Consultation  Reason for Consult: Progressive gangrene left foot  HPI: Emily Shea is a 58 y.o. female who presents for evaluation of progressive gangrene left foot. Patient was recently evaluated by Dr. early with gangrenous changes left foot. Patient with history of diabetes mellitus. Ulceration left foot has been present for several months. Angiogram was scheduled for Dr. Myra Gianotti on December 30 but was canceled because of extensive yeast infection in right inguinal area. Angiogram was to be rescheduled but patient was admitted to [facility] for progressive gangrene left foot. Transferred yesterday to [facility].   Past Medical History  Diagnosis Date  . Diabetes mellitus without complication   . Chronic kidney disease   . GERD (gastroesophageal reflux disease)   . Stroke   . Hypertension   . Blindness   . Anemia   . DVT (deep venous thrombosis)    Past Surgical History  Procedure Laterality Date  . Appendectomy    . Lipoma excision      from L flank   History   Social History  . Marital Status: Divorced    Spouse Name: N/A    Number of Children: N/A  . Years of Education: N/A   Social History Main Topics  . Smoking status: Never Smoker   . Smokeless tobacco: Never Used  . Alcohol Use: No  . Drug Use: No  . Sexual Activity: Not on file   Other Topics Concern  . Not on file   Social History Narrative  . No narrative on file   Family History  Problem Relation Age of Onset  . Diabetes Mother   . Heart disease Mother   . Hypertension Mother   . Peripheral vascular disease Mother     amputation  . Diabetes Father   . Hypertension Father   . Varicose Veins Father   . Peripheral vascular disease Father     amputation  . Cancer Sister   . Diabetes Sister   . Hypertension Sister   . Diabetes Brother   . Hypertension Brother   . Peripheral vascular disease Brother     amputation   Allergies  Allergen Reactions  . Procardia  [Nifedipine] Other (See Comments)    Constipation, gum irritation   Prior to Admission medications   Medication Sig Start Date End Date Taking? Authorizing Provider  amLODipine (NORVASC) 2.5 MG tablet Take 2.5 mg by mouth daily.    Historical Provider, MD  aspirin 81 MG tablet Take 81 mg by mouth daily.    Historical Provider, MD  ciclopirox (PENLAC) 8 % solution Apply 1 application topically at bedtime. Apply over nail and surrounding skin. Apply daily over previous coat. After seven (7) days, may remove with alcohol and continue cycle.    Historical Provider, MD  ferrous sulfate 325 (65 FE) MG tablet Take 325 mg by mouth daily with breakfast.    Historical Provider, MD  gabapentin (NEURONTIN) 300 MG capsule Take 300 mg by mouth daily.    Historical Provider, MD  HYDROcodone-acetaminophen (NORCO) 10-325 MG per tablet Take 1 tablet by mouth every 6 (six) hours as needed for moderate pain.     Historical Provider, MD  latanoprost (XALATAN) 0.005 % ophthalmic solution Place 1 drop into both eyes at bedtime.    Historical Provider, MD  Liraglutide (VICTOZA ) Inject 18 mg into the skin daily.    Historical Provider, MD  losartan-hydrochlorothiazide (HYZAAR) 100-25 MG per tablet Take 1 tablet by mouth daily.    Historical  Provider, MD  metFORMIN (GLUCOPHAGE) 1000 MG tablet Take 850 mg by mouth 3 (three) times daily.    Historical Provider, MD  metoprolol succinate (TOPROL-XL) 50 MG 24 hr tablet Take 50 mg by mouth daily. Take with or immediately following a meal.    Historical Provider, MD  potassium chloride SA (K-DUR,KLOR-CON) 20 MEQ tablet Take 20 mEq by mouth daily.    Historical Provider, MD  pravastatin (PRAVACHOL) 20 MG tablet Take 20 mg by mouth daily.    Historical Provider, MD  ranitidine (ZANTAC) 150 MG tablet Take 150 mg by mouth 2 (two) times daily.     Historical Provider, MD  solifenacin (VESICARE) 5 MG tablet Take 10 mg by mouth daily.    Historical Provider, MD  triamcinolone cream  (KENALOG) 0.1 % Apply 1 application topically 2 (two) times daily.    Historical Provider, MD     Positive ROS: Denies active chest pain or dyspnea on exertion  All other systems have been reviewed and were otherwise negative with the exception of those mentioned in the HPI and as above.  Physical Exam: Filed Vitals:   04/02/13 0436  BP: 147/78  Pulse: 94  Temp: 98.7 F (37.1 C)  Resp: 18    General: Alert, no acute distress HEENT: Normal for age Cardiovascular: Regular rate and rhythm. Carotid pulses 2+, no bruits audible Respiratory: Clear to auscultation. No cyanosis, no use of accessory musculature GI: No organomegaly, abdomen is soft and non-tender Skin: No lesions in the area of chief complaint Neurologic: Sensation intact distally Psychiatric: Patient is competent for consent with normal mood and affect Musculoskeletal: No obvious deformities Extremities: Left lower extremity with 3+ femoral pulse palpable. No popliteal or distal pulses palpable. Extensive gangrene lateral left foot involving fifth toe and progressing proximally and anteriorly back to the proximal third of left foot. Right leg with 3+ femoral pulse. No active ulceration right lower extremity.     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 Even with   successful revascularization which is very questionable that it would be possible Left foot will not heal because of extensive nature of gangrene Patient needs left leg amputation-BKA versus AKA  Discussed this with patient and she is agreeable to proceed Will schedule this for Monday for Dr. early or fields  Josephina GipJames Everlie Eble, MD 04/02/2013 10:03 AM

## 2013-04-04 NOTE — Anesthesia Preprocedure Evaluation (Addendum)
Anesthesia Evaluation  Patient identified by MRN, date of birth, ID band Patient awake    Reviewed: Allergy & Precautions, H&P , NPO status , Patient's Chart, lab work & pertinent test results, reviewed documented beta blocker date and time   History of Anesthesia Complications Negative for: history of anesthetic complications  Airway Mallampati: I TM Distance: >3 FB Neck ROM: Full    Dental  (+) Dental Advisory Given, Poor Dentition and Missing   Pulmonary neg pulmonary ROS,    Pulmonary exam normal       Cardiovascular hypertension, Pt. on home beta blockers + Peripheral Vascular Disease and DVT Rhythm:Regular Rate:Normal - Systolic murmurs    Neuro/Psych Left eye blindness CVA, Residual Symptoms negative psych ROS   GI/Hepatic GERD-  Medicated,  Endo/Other  diabetes, Poorly Controlled, Type 2, Insulin DependentMorbid obesity  Renal/GU Renal InsufficiencyRenal disease     Musculoskeletal   Abdominal   Peds  Hematology  (+) Blood dyscrasia, anemia , H/H 8.0/24.7   Anesthesia Other Findings   Reproductive/Obstetrics                         Anesthesia Physical Anesthesia Plan  ASA: IV  Anesthesia Plan: General   Post-op Pain Management:    Induction: Intravenous  Airway Management Planned: Oral ETT  Additional Equipment:   Intra-op Plan:   Post-operative Plan: Extubation in OR  Informed Consent: I have reviewed the patients History and Physical, chart, labs and discussed the procedure including the risks, benefits and alternatives for the proposed anesthesia with the patient or authorized representative who has indicated his/her understanding and acceptance.   Dental advisory given  Plan Discussed with: Anesthesiologist, Surgeon and CRNA  Anesthesia Plan Comments:        Anesthesia Quick Evaluation

## 2013-04-04 NOTE — Op Note (Signed)
    OPERATIVE REPORT  DATE OF SURGERY: 04/04/2013  PATIENT: Emily Shea, 58 y.o. female MRN: [id number]  DOB: 1955/06/09  PRE-OPERATIVE DIAGNOSIS: Gangrene left foot  POST-OPERATIVE DIAGNOSIS:  Same  PROCEDURE: Left below-knee amputation  SURGEON:  Gretta Beganodd Early, M.D.  PHYSICIAN ASSISTANT: Nurse  ANESTHESIA:  Gen.  EBL: 100 ml  Total I/O In: 1000 [I.V.:1000] Out: 900 [Urine:700; Blood:200]  BLOOD ADMINISTERED: None  DRAINS: None  SPECIMEN: Left below-knee amputation  COUNTS CORRECT:  YES  PLAN OF CARE: PACU   PATIENT DISPOSITION:  PACU - hemodynamically stable  PROCEDURE DETAILS: The patient has had progressive gangrene of the left foot. I discussed with her the option for below knee versus above-knee amputation. She does clinically appear to have adequate flow for high likelihood of healing a below-knee amputation. I did explain that there was a more certainty of the dictation healing above the knee but that her relocation would be easier with a below-knee amputation. She wishes to proceed with a below-knee amputation today.  The patient was taken to the operating room placed supine position where the area of the left leg and calf and thigh were prepped and draped in usual sterile fashion. Using a posterior based muscle flap the incision was made several fingerbreadths below the tibial prominence and carried down through the anterior tibial muscle bodies with electrocautery. This was in line with the skin incision. The posterior based flap was also any through the fascia with electrocautery in line with the skin incision to the gastrocnemius muscle. The anterior tibial ovary and vein were ligated with 0 Vicryl ties and divided. All her vessels were occluded with hemostats and divided. Soleus muscle was divided in line with the anterior skin incision. Periosteum was elevated off the tibia and fibula. The tibia was divided with a Gigli saw and the fibula was divided with bone  shears. The edges of the tibia were smoothed with a bone rasp. Popliteal artery and vein were ligated with 0 Vicryl ties and divided. The specimen was passed off the field. Hemostasis obtained electrocautery. The posterior based muscle flap was brought anterior over the bone edges and was closed with the anterior fashion to the posterior fascia with 0 Vicryl figure-of-eight sutures. Skin was closed with skin staples. A sterile dressing was applied and the patient was taken to the recovery room in stable condition    Gretta Beganodd Early, M.D. 04/04/2013 3:11 PM

## 2013-04-04 NOTE — Progress Notes (Signed)
CRITICAL VALUE ALERT  Critical value received: Vancomycin   Date of notification:  04/04/2013  Time of notification:  1805  Critical value read back:yes  Nurse who received alert:  Barbarann EhlersAdrian Carman Auxier, RN  MD notified (1st page):  Dr Gonzella Lexhungel  Time of first page:  1845  MD notified (2nd page):  Time of second page:  Responding MD: Dr Gonzella Lexhungel  Time MD responded:  [phone]

## 2013-04-04 NOTE — Interval H&P Note (Signed)
History and Physical Interval Note:  04/04/2013 12:52 PM  Emily Shea  has presented today for surgery, with the diagnosis of /  The various methods of treatment have been discussed with the patient and family. After consideration of risks, benefits and other options for treatment, the patient has consented to  Procedure(s): AMPUTATION BELOW KNEE (N/A) AMPUTATION ABOVE KNEE (N/A) as a surgical intervention .  The patient's history has been reviewed, patient examined, no change in status, stable for surgery.  I have reviewed the patient's chart and labs.  Questions were answered to the patient's satisfaction.     EARLY, TODD

## 2013-04-04 NOTE — Transfer of Care (Signed)
Immediate Anesthesia Transfer of Care Note  Patient: Emily Shea  Procedure(s) Performed: Procedure(s): AMPUTATION BELOW KNEE (N/A)  Patient Location: PACU  Anesthesia Type:General  Level of Consciousness: awake and alert   Airway & Oxygen Therapy: Patient Spontanous Breathing and Patient connected to nasal cannula oxygen  Post-op Assessment: Report given to PACU RN, Post -op Vital signs reviewed and stable and Patient moving all extremities X 4  Post vital signs: Reviewed and stable  Complications: No apparent anesthesia complications

## 2013-04-04 NOTE — Anesthesia Postprocedure Evaluation (Signed)
Anesthesia Post Note  Patient: Emily Shea  Procedure(s) Performed: Procedure(s) (LRB): AMPUTATION BELOW KNEE (N/A)  Anesthesia type: General  Patient location: PACU  Post pain: Pain level controlled and Adequate analgesia  Post assessment: Post-op Vital signs reviewed, Patient's Cardiovascular Status Stable, Respiratory Function Stable, Patent Airway and Pain level controlled  Last Vitals:  Filed Vitals:   04/04/13 1600  BP: 147/70  Pulse: 69  Temp: 37.4 C  Resp: 18    Post vital signs: Reviewed and stable  Level of consciousness: awake, alert  and oriented  Complications: No apparent anesthesia complications

## 2013-04-04 NOTE — Progress Notes (Signed)
Per anethesia Holly, hold all medications including Lantus 10units until after surgery.  All cardiac medications given per surgical protocol.  [facility]

## 2013-04-04 NOTE — Progress Notes (Signed)
[facility] PROGRESS NOTE  Emily Shea NWG:[id number] DOB: 04-02-1955 DOA: 04/01/2013 PCP: Emily Pandy, MD  Brief narrative  58 y.o. Female with uncontrolled DM  who was seen at [facility] in [place] and transferred to [facility] for further evalauation and treatment of an Ischemic and gangrenous Left Diabetic Foot ulcer. Patient reports that the ulcer began several months about 1 month ago as a blister on the side of her left foot and 5th toe and an ulcer on the left lateral ankle. The ulcer of her toe worsened over that time period and began to turn black and drain a foul smelling drainage. She denies having any fevers or chills. She was evaluated by Vascular Dr. Arbie Shea on 03/22/2013, and by Dr. Myra Shea on 03/30/2013 For an ABD aortogram procedure to evaluated for Bypass Grafting potential of her Left Lower Extremity. The procedure was postponed so that she could receive treatment for an intertriginous candidal infection of her groin area and she was started on Diflucan and Nystatin Powder TID.   Assessment/Plan:  Gas Gangrene of Left Foot/diabtic Osteomyelitis/Left diabetic Foot Ulcers On empiric- IV Vancomycin and Cefepime.supratherapeutic vanco trough noted ( 32). Hold vanco and repeat levels reassessed by vascular sx and recommended that Patient needs left leg amputation Taken to OR and underwent left leg BKA. tolerated well. Stable post op Pain control. PT/OT  Uncontrolled type 2 DM HbA1C- 8.5  SSI  Added lantus while in hospital  Hold Metformin   Hyperlipidemia Continue Pravastatin   HTN Continue amlodipine. ARB and HCTZ on hold  CKD stage III - monitor BUN/Cr.   Anemia-  Appears to be due to iron def Transfuse for hb < 7   Leukocytosis  -monitor   Candidiasis of groin  continue nystatin   Code Status: full  Family Communication: none at bedside Disposition Plan: pending PT/OT Consultants:  vascular Procedures:  Antibiotics:  maxipime   vanc    HPI/Subjective: Patient seen after returning from OR.   Objective: Filed Vitals:   04/04/13 1620  BP: 152/75  Pulse: 73  Temp: 99.9 F (37.7 C)  Resp: 18    Intake/Output Summary (Last 24 hours) at 04/04/13 1841 Last data filed at 04/04/13 1720  Gross per 24 hour  Intake   2455 ml  Output    900 ml  Net   1555 ml   Filed Weights   04/01/13 2100  Weight: 116.121 kg (256 lb)    Exam:   General:  Middle aged female in nAD  HEENT: no pallor, moist oral mucosa  Chest: clear to auscultation b/l, no added sounds  CVS: NS1&S2, no murmurs, rubs or gallop  Abd: soft, NT, ND, BS+  Ext: warm, no edema. Left BKA site with dressing   CNS: AAOX3    Data Reviewed: Basic Metabolic Panel:  Recent Labs Lab 03/29/13 1029 04/02/13 0534 04/03/13 0610 04/04/13 0418 04/04/13 1653  NA 137 137 137 137  --   K 4.1 4.1 4.0 4.0  --   CL 101 97 101 100  --   CO2  --  24 20 21   --   GLUCOSE 341* 201* 279* 280*  --   BUN 27* 19 20 18   --   CREATININE 1.30* 1.28* 1.36* 1.37* 1.36*  CALCIUM  --  8.9 8.4 8.4  --    Liver Function Tests: No results found for this basename: AST, ALT, ALKPHOS, BILITOT, PROT, ALBUMIN,  in the last 168 hours No results found for this basename:  LIPASE, AMYLASE,  in the last 168 hours No results found for this basename: AMMONIA,  in the last 168 hours CBC:  Recent Labs Lab 03/29/13 1029 04/02/13 0534 04/03/13 0610 04/04/13 0418 04/04/13 1653  WBC  --  16.1* 16.0* 16.1* 15.6*  HGB 11.6* 9.1* 8.7* 8.0* 8.1*  HCT 34.0* 28.0* 26.7* 24.7* 24.7*  MCV  --  86.4 85.9 84.9 85.5  PLT  --  320 315 308 329   Cardiac Enzymes: No results found for this basename: CKTOTAL, CKMB, CKMBINDEX, TROPONINI,  in the last 168 hours BNP (last 3 results) No results found for this basename: PROBNP,  in the last 8760 hours CBG:  Recent Labs Lab 04/04/13 0646 04/04/13 1101 04/04/13 1326 04/04/13 1519 04/04/13 1627  GLUCAP 274* 235* 219* 216* 228*     Recent Results (from the past 240 hour(s))  SURGICAL PCR SCREEN     Status: None   Collection Time    04/03/13 11:56 PM      Result Value Range Status   MRSA, PCR NEGATIVE  NEGATIVE Final   Staphylococcus aureus NEGATIVE  NEGATIVE Final   Comment:            The Xpert SA Assay (FDA     approved for NASAL specimens     in patients over 58 years of age),     is one component of     a comprehensive surveillance     program.  Test performance has     been validated by [facility] for patients greater     than or equal to 58 year old.     It is not intended     to diagnose infection nor to     guide or monitor treatment.     Studies: No results found.  Scheduled Meds: . amLODipine  2.5 mg Oral Daily  . ceFEPime (MAXIPIME) IV  1 g Intravenous Q12H  . darifenacin  15 mg Oral Daily  . [START ON 04/05/2013] docusate sodium  100 mg Oral Daily  . [START ON 04/05/2013] enoxaparin (LOVENOX) injection  40 mg Subcutaneous Q24H  . famotidine  20 mg Oral BID  . ferrous sulfate  325 mg Oral Q breakfast  . fluconazole  200 mg Oral Daily  . fluconazole  200 mg Oral Daily  . gabapentin  300 mg Oral QHS  . losartan  100 mg Oral Daily   And  . hydrochlorothiazide  25 mg Oral Daily  . insulin aspart  0-15 Units Subcutaneous TID WC  . insulin aspart  0-5 Units Subcutaneous QHS  . insulin glargine  10 Units Subcutaneous Daily  . latanoprost  1 drop Both Eyes QHS  . [START ON 04/05/2013] metFORMIN  850 mg Oral TID WC  . metoprolol succinate  50 mg Oral Q breakfast  . nystatin   Topical TID  . pantoprazole  40 mg Oral Daily  . potassium chloride SA  20 mEq Oral Daily  . simvastatin  10 mg Oral q1800  . vancomycin  1,500 mg Intravenous Q24H   Continuous Infusions: . sodium chloride 75 mL/hr at 04/04/13 0500  . sodium chloride        Time spent: 25 minutes    Emily Shea  [facility] Pager [phone]. If 7PM-7AM, please contact night-coverage at [email or web address], password  [email or web address] 04/04/2013, 6:41 PM  LOS: 3 days

## 2013-04-04 NOTE — Anesthesia Procedure Notes (Addendum)
Procedure Name: Intubation Date/Time: 04/04/2013 1:45 PM Performed by: Elon AlasLEE, Jasnoor Trussell BROWN Pre-anesthesia Checklist: Patient identified, Emergency Drugs available, Suction available and Patient being monitored Patient Re-evaluated:Patient Re-evaluated prior to inductionOxygen Delivery Method: Circle system utilized Preoxygenation: Pre-oxygenation with 100% oxygen Intubation Type: IV induction Ventilation: Mask ventilation without difficulty and Oral airway inserted - appropriate to patient size Laryngoscope Size: Mac and 3 Grade View: Grade I Tube type: Oral Tube size: 7.0 mm Number of attempts: 1 Airway Equipment and Method: Stylet Placement Confirmation: ETT inserted through vocal cords under direct vision,  breath sounds checked- equal and bilateral and positive ETCO2 Secured at: 20 cm Tube secured with: Tape Dental Injury: Teeth and Oropharynx as per pre-operative assessment

## 2013-04-05 DIAGNOSIS — S88119A Complete traumatic amputation at level between knee and ankle, unspecified lower leg, initial encounter: Secondary | ICD-10-CM

## 2013-04-05 DIAGNOSIS — L98499 Non-pressure chronic ulcer of skin of other sites with unspecified severity: Secondary | ICD-10-CM

## 2013-04-05 DIAGNOSIS — I739 Peripheral vascular disease, unspecified: Secondary | ICD-10-CM

## 2013-04-05 LAB — CBC
HCT: 23.6 % — ABNORMAL LOW (ref 36.0–46.0)
Hemoglobin: 7.7 g/dL — ABNORMAL LOW (ref 12.0–15.0)
MCH: 27.6 pg (ref 26.0–34.0)
MCHC: 32.6 g/dL (ref 30.0–36.0)
MCV: 84.6 fL (ref 78.0–100.0)
Platelets: 350 10*3/uL (ref 150–400)
RBC: 2.79 MIL/uL — ABNORMAL LOW (ref 3.87–5.11)
RDW: 15 % (ref 11.5–15.5)
WBC: 12.9 10*3/uL — ABNORMAL HIGH (ref 4.0–10.5)

## 2013-04-05 LAB — GLUCOSE, CAPILLARY
GLUCOSE-CAPILLARY: 232 mg/dL — AB (ref 70–99)
GLUCOSE-CAPILLARY: 247 mg/dL — AB (ref 70–99)
Glucose-Capillary: 271 mg/dL — ABNORMAL HIGH (ref 70–99)
Glucose-Capillary: 266 mg/dL — ABNORMAL HIGH (ref 70–99)

## 2013-04-05 LAB — VANCOMYCIN, TROUGH: Vancomycin Tr: 14.5 ug/mL (ref 10.0–20.0)

## 2013-04-05 LAB — BASIC METABOLIC PANEL
BUN: 16 mg/dL (ref 6–23)
CO2: 23 mEq/L (ref 19–32)
Calcium: 8.4 mg/dL (ref 8.4–10.5)
Chloride: 98 mEq/L (ref 96–112)
Creatinine, Ser: 1.3 mg/dL — ABNORMAL HIGH (ref 0.50–1.10)
GFR calc Af Amer: 52 mL/min — ABNORMAL LOW (ref >90)
GFR calc non Af Amer: 45 mL/min — ABNORMAL LOW (ref >90)
GLUCOSE: 246 mg/dL — AB (ref 70–99)
Potassium: 4.1 mEq/L (ref 3.7–5.3)
Sodium: 136 mEq/L — ABNORMAL LOW (ref 137–147)

## 2013-04-05 MED ORDER — GLUCERNA SHAKE PO LIQD
237.0000 mL | Freq: Three times a day (TID) | ORAL | Status: DC
  Administered 2013-04-05 – 2013-04-07 (×6): 237 mL via ORAL

## 2013-04-05 MED ORDER — INSULIN ASPART 100 UNIT/ML ~~LOC~~ SOLN
3.0000 [IU] | Freq: Three times a day (TID) | SUBCUTANEOUS | Status: DC
  Administered 2013-04-06 (×2): 3 [IU] via SUBCUTANEOUS

## 2013-04-05 MED ORDER — INSULIN GLARGINE 100 UNIT/ML ~~LOC~~ SOLN
15.0000 [IU] | Freq: Every day | SUBCUTANEOUS | Status: DC
  Administered 2013-04-06: 15 [IU] via SUBCUTANEOUS
  Filled 2013-04-05: qty 0.15

## 2013-04-05 MED ORDER — FERROUS SULFATE 325 (65 FE) MG PO TABS
325.0000 mg | ORAL_TABLET | Freq: Two times a day (BID) | ORAL | Status: DC
  Administered 2013-04-05 – 2013-04-07 (×4): 325 mg via ORAL
  Filled 2013-04-05 (×6): qty 1

## 2013-04-05 NOTE — Progress Notes (Signed)
Subjective: Interval History: none.. comfortable. Reports some burning but no pain in her left BKA  Objective: Vital signs in last 24 hours: Temp:  [98.9 F (37.2 C)-99.9 F (37.7 C)] 98.9 F (37.2 C) (01/06 0554) Pulse Rate:  [65-88] 82 (01/06 0554) Resp:  [17-22] 18 (01/06 0554) BP: (118-169)/(66-87) 156/87 mmHg (01/06 0554) SpO2:  [94 %-97 %] 95 % (01/06 0554)  Intake/Output from previous day: 01/05 0701 - 01/06 0700 In: 1240 [P.O.:240; I.V.:1000] Out: 900 [Urine:700; Blood:200] Intake/Output this shift:    BKA dressing is intact with no drainage  Lab Results:  Recent Labs  04/04/13 1653 04/05/13 0607  WBC 15.6* 12.9*  HGB 8.1* 7.7*  HCT 24.7* 23.6*  PLT 329 350   BMET  Recent Labs  04/04/13 0418 04/04/13 1653 04/05/13 0607  NA 137  --  136*  K 4.0  --  4.1  CL 100  --  98  CO2 21  --  23  GLUCOSE 280*  --  246*  BUN 18  --  16  CREATININE 1.37* 1.36* 1.30*  CALCIUM 8.4  --  8.4    Studies/Results: No results found. Anti-infectives: Anti-infectives   Start     Dose/Rate Route Frequency Ordered Stop   04/04/13 1630  fluconazole (DIFLUCAN) tablet 200 mg     200 mg Oral Daily 04/04/13 1627     04/02/13 1400  vancomycin (VANCOCIN) 1,500 mg in sodium chloride 0.9 % 500 mL IVPB     1,500 mg 250 mL/hr over 120 Minutes Intravenous Every 24 hours 04/01/13 2315     04/02/13 1000  ceFEPIme (MAXIPIME) 1 g in dextrose 5 % 50 mL IVPB     1 g 100 mL/hr over 30 Minutes Intravenous Every 12 hours 04/01/13 2224     04/02/13 0000  fluconazole (DIFLUCAN) tablet 200 mg     200 mg Oral Daily 04/01/13 2350        Assessment/Plan: s/p Procedure(s): AMPUTATION BELOW KNEE (N/A) Stable postop day 1. Will check amputation site by removing dressing tomorrow.   LOS: 4 days   Mauricio Dahlen 04/05/2013, 7:48 AM

## 2013-04-05 NOTE — Evaluation (Signed)
Occupational Therapy Evaluation Patient Details Name: Emily Shea MRN: [id number] DOB: Sep 24, 1955 Today's Date: 04/05/2013 Time: 1423-1510 OT Time Calculation (min): 47 min  OT Assessment / Plan / Recommendation History of present illness pt s/p BKA 04/04/13   Clinical Impression   Pt currently needs total assist +2 or greater for functional transfers and bed mobility as well as max assist for LB selfcare tasks sitting EOB.  Pt will benefit from acute care OT to begin rehab phase for progression back to supervision/modified independent level for selfcare.  Feel based on pt's current need for +2 assist that she will have to have extensive SNF rehab prior to return back to her adult living center.      OT Assessment  Patient needs continued OT Services    Follow Up Recommendations  Supervision/Assistance - 24 hour;SNF    Barriers to Discharge Decreased caregiver support    Equipment Recommendations  None recommended by OT       Frequency  Min 2X/week    Precautions / Restrictions Precautions Precautions: Fall Precaution Comments: left AKA Restrictions Weight Bearing Restrictions: No   Pertinent Vitals/Pain Pt reporting back pain 7/10, nursing aware    ADL  Eating/Feeding: Simulated;Set up Where Assessed - Eating/Feeding: Edge of bed Grooming: Simulated;Supervision/safety Where Assessed - Grooming: Unsupported standing Upper Body Bathing: Simulated;Set up Where Assessed - Upper Body Bathing: Unsupported sitting Lower Body Bathing: Simulated;Maximal assistance Where Assessed - Lower Body Bathing: Unsupported sitting;Lean right and/or left Upper Body Dressing: Simulated;Set up Where Assessed - Upper Body Dressing: Unsupported sitting Lower Body Dressing: Simulated;Maximal assistance Where Assessed - Lower Body Dressing: Unsupported sitting;Lean right and/or left Toilet Transfer: Simulated;+2 Total assistance Toilet Transfer: Patient Percentage: 10% Toilet Transfer Method:  Squat pivot Toilet Transfer Equipment: Other (comment) (simulated to bedside chair) Toileting - Clothing Manipulation and Hygiene: Performed;+1 Total assistance Where Assessed - Toileting Clothing Manipulation and Hygiene: Rolling right and/or left Tub/Shower Transfer Method: Not assessed Transfers/Ambulation Related to ADLs: Pt required total assist for squat pivot transfer from bed to bedside chair. ADL Comments: Pt currently needs max assist for bed level to sitting EOB ADLs.  Total +2 or 3 would be needed for sit to stand and standing selfcare tasks at this time.  Based on prior functional level and amount of assist needed at this time feel pt will need extensive SNF level rehab.      OT Diagnosis: Generalized weakness;Acute pain  OT Problem List: Decreased strength;Decreased activity tolerance;Impaired balance (sitting and/or standing);Impaired vision/perception;Decreased knowledge of use of DME or AE;Pain OT Treatment Interventions: Self-care/ADL training;Patient/family education;Balance training;Therapeutic activities;DME and/or AE instruction   OT Goals(Current goals can be found in the care plan section) Acute Rehab OT Goals Patient Stated Goal: Pt did not state this session. OT Goal Formulation: With patient Time For Goal Achievement: 04/19/13 Potential to Achieve Goals: Fair  Visit Information  Last OT Received On: 04/05/13 Assistance Needed: +2 History of Present Illness: pt s/p BKA 04/04/13       Prior Functioning     Home Living Family/patient expects to be discharged to:: Other (Comment) ("adult care facility") Prior Function Level of Independence: Independent with assistive device(s) Comments: pt states she used RW or cane Communication Communication: Expressive difficulties (historoy of CVA) Dominant Hand: Right         Vision/Perception Vision - History Baseline Vision: Other (comment) Visual History: Other (comment) (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  Elbow flexion/extension AROM WFLS, strength 4/5 throughout. Cervical / Trunk Assessment Cervical / Trunk Assessment: Normal     Mobility Bed Mobility Bed Mobility: Rolling Right;Rolling Left;Supine to Sit;Sitting - Scoot to DelphiEdge of Bed Rolling Right: 2: Max assist;With rail Rolling Left: 1: +1 Total assist Supine to Sit: 1: +1 Total assist Sitting - Scoot to Edge of Bed: 3: Mod assist Transfers Details for Transfer Assistance: Pt transferred squat pivot to the bedside chair with total assist +2 (pt 10%).  Recommend Maxi Move for transfers at this time.        Balance Static Sitting Balance Static Sitting - Balance Support: Right upper extremity supported;Left upper extremity supported Static Sitting - Level of Assistance: 5: Stand by assistance   End of Session OT - End of Session Activity Tolerance: Patient limited by pain Patient left: in chair;with call bell/phone within reach Nurse Communication: Need for lift equipment     Nigil Braman OTR/L 04/05/2013, 3:22 PM

## 2013-04-05 NOTE — Consult Note (Signed)
Physical Medicine and Rehabilitation Consult Reason for Consult: Left below-knee amputation Referring Physician: [facility]   HPI: Emily Shea is a 58 y.o. right-handed female with history of CVA, chronic renal insufficiency baseline creatinine 1.2-1.36, diabetes mellitus peripheral adenopathy and peripheral vascular disease. Patient is a resident of adult care home( [facility]) in [place]. Presented 04/01/2013 with nonhealing left foot diabetic ulcer. Patient had initially been placed on intravenous antibiotics and no change with conservative care. Vascular surgery consulted limb not felt to be salvageable. 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 Arbie Cookey. Postoperative pain management. Subcutaneous Lovenox added for DVT prophylaxis. Acute blood loss anemia 8.1 and monitored. Physical and occupational therapy evaluations pending. M.D. is requested physical medicine rehabilitation consult to consider inpatient rehabilitation services   Review of Systems  Eyes:       Legally Blind  Gastrointestinal:       GERD  Genitourinary: Positive for urgency.  Musculoskeletal: Positive for joint pain and myalgias.  Neurological: Positive for weakness.  All other systems reviewed and are negative.   Past Medical History  Diagnosis Date  . Diabetes mellitus without complication   . Chronic kidney disease   . GERD (gastroesophageal reflux disease)   . Stroke   . Hypertension   . Blindness   . Anemia   . DVT (deep venous thrombosis)    Past Surgical History  Procedure Laterality Date  . Appendectomy    . Lipoma excision      from L flank   Family History  Problem Relation Age of Onset  . Diabetes Mother   . Heart disease Mother   . Hypertension Mother   . Peripheral vascular disease Mother     amputation  . Diabetes Father   . Hypertension Father   . Varicose Veins Father   . Peripheral vascular disease Father     amputation  . Cancer Sister   . Diabetes Sister   .  Hypertension Sister   . Diabetes Brother   . Hypertension Brother   . Peripheral vascular disease Brother     amputation   Social History:  reports that she has never smoked. She has never used smokeless tobacco. She reports that she does not drink alcohol or use illicit drugs. Allergies:  Allergies  Allergen Reactions  . Procardia [Nifedipine] Other (See Comments)    Constipation, gum irritation   Medications Prior to Admission  Medication Sig Dispense Refill  . amLODipine (NORVASC) 2.5 MG tablet Take 2.5 mg by mouth daily.      Marland Kitchen aspirin 81 MG tablet Take 81 mg by mouth daily.      . ciclopirox (PENLAC) 8 % solution Apply 1 application topically at bedtime. Apply over nail and surrounding skin. Apply daily over previous coat. After seven (7) days, may remove with alcohol and continue cycle.      . ferrous sulfate 325 (65 FE) MG tablet Take 325 mg by mouth daily with breakfast.      . fluconazole (DIFLUCAN) 200 MG tablet Take 200 mg by mouth daily. For 3 days starting on 03/29/13      . gabapentin (NEURONTIN) 300 MG capsule Take 300 mg by mouth daily.      Marland Kitchen HYDROcodone-acetaminophen (NORCO) 10-325 MG per tablet Take 1 tablet by mouth every 6 (six) hours as needed for moderate pain.       Marland Kitchen latanoprost (XALATAN) 0.005 % ophthalmic solution Place 1 drop into both eyes at bedtime.      . Liraglutide (VICTOZA [place])  Inject 18 mg into the skin daily.      Marland Kitchen losartan-hydrochlorothiazide (HYZAAR) 100-25 MG per tablet Take 1 tablet by mouth daily.      . metFORMIN (GLUCOPHAGE) 850 MG tablet Take 850 mg by mouth 3 (three) times daily.      . metoprolol succinate (TOPROL-XL) 50 MG 24 hr tablet Take 50 mg by mouth daily. Take with or immediately following a meal.      . nystatin (MYCOSTATIN/NYSTOP) 100000 UNIT/GM POWD Apply topically daily as needed (to affected area).      . potassium chloride SA (K-DUR,KLOR-CON) 20 MEQ tablet Take 20 mEq by mouth daily.      . pravastatin (PRAVACHOL) 20 MG tablet  Take 20 mg by mouth daily.      . ranitidine (ZANTAC) 150 MG tablet Take 150 mg by mouth 2 (two) times daily.       . solifenacin (VESICARE) 5 MG tablet Take 10 mg by mouth daily.      Marland Kitchen triamcinolone cream (KENALOG) 0.1 % Apply 1 application topically 2 (two) times daily.        Home: Home Living Family/patient expects to be discharged to:: Assisted living  Functional History:   Functional Status:  Mobility:          ADL:    Cognition: Cognition Orientation Level: Oriented X4    Blood pressure 156/87, pulse 82, temperature 98.9 F (37.2 C), temperature source Oral, resp. rate 18, height 5\' 11"  (1.803 m), weight 116.121 kg (256 lb), SpO2 95.00%. Physical Exam  Vitals reviewed. Constitutional: She is oriented to person, place, and time.  HENT:  Head: Normocephalic.  Eyes:  Pupils sluggish to light  Neck: Normal range of motion. Neck supple. No thyromegaly present.  Cardiovascular: Normal rate and regular rhythm.   Respiratory: Effort normal and breath sounds normal. No respiratory distress.  GI: Soft. Bowel sounds are normal. She exhibits no distension.  Neurological: She is alert and oriented to person, place, and time.  Mood is flat but appropriate she does follow simple commands, decreased PP and LT below the right knee.   Skin:  Heavy bulky dressing to left below-knee amputation site, swollen limb  Psychiatric:  flat    Results for orders placed during the hospital encounter of 04/01/13 (from the past 24 hour(s))  GLUCOSE, CAPILLARY     Status: Abnormal   Collection Time    04/04/13  6:46 AM      Result Value Range   Glucose-Capillary 274 (*) 70 - 99 mg/dL  GLUCOSE, CAPILLARY     Status: Abnormal   Collection Time    04/04/13 11:01 AM      Result Value Range   Glucose-Capillary 235 (*) 70 - 99 mg/dL  GLUCOSE, CAPILLARY     Status: Abnormal   Collection Time    04/04/13  1:26 PM      Result Value Range   Glucose-Capillary 219 (*) 70 - 99 mg/dL   GLUCOSE, CAPILLARY     Status: Abnormal   Collection Time    04/04/13  3:19 PM      Result Value Range   Glucose-Capillary 216 (*) 70 - 99 mg/dL  GLUCOSE, CAPILLARY     Status: Abnormal   Collection Time    04/04/13  4:27 PM      Result Value Range   Glucose-Capillary 228 (*) 70 - 99 mg/dL   Comment 1 Documented in Chart     Comment 2 Notify RN    VANCOMYCIN,  TROUGH     Status: Abnormal   Collection Time    04/04/13  4:53 PM      Result Value Range   Vancomycin Tr 32.7 (*) 10.0 - 20.0 ug/mL  CBC     Status: Abnormal   Collection Time    04/04/13  4:53 PM      Result Value Range   WBC 15.6 (*) 4.0 - 10.5 K/uL   RBC 2.89 (*) 3.87 - 5.11 MIL/uL   Hemoglobin 8.1 (*) 12.0 - 15.0 g/dL   HCT 16.124.7 (*) 09.636.0 - 04.546.0 %   MCV 85.5  78.0 - 100.0 fL   MCH 28.0  26.0 - 34.0 pg   MCHC 32.8  30.0 - 36.0 g/dL   RDW 40.915.0  81.111.5 - 91.415.5 %   Platelets 329  150 - 400 K/uL  CREATININE, SERUM     Status: Abnormal   Collection Time    04/04/13  4:53 PM      Result Value Range   Creatinine, Ser 1.36 (*) 0.50 - 1.10 mg/dL   GFR calc non Af Amer 42 (*) >90 mL/min   GFR calc Af Amer 49 (*) >90 mL/min  GLUCOSE, CAPILLARY     Status: Abnormal   Collection Time    04/04/13  9:15 PM      Result Value Range   Glucose-Capillary 285 (*) 70 - 99 mg/dL   No results found.  Assessment/Plan: Diagnosis: left BKA 1. Does the need for close, 24 hr/day medical supervision in concert with the patient's rehab needs make it unreasonable for this patient to be served in a less intensive setting? Yes and Potentially 2. Co-Morbidities requiring supervision/potential complications: DM2, ckd 3. Due to bladder management, bowel management, safety, skin/wound care, disease management, medication administration, pain management and patient education, does the patient require 24 hr/day rehab nursing? Yes and Potentially 4. Does the patient require coordinated care of a physician, rehab nurse, PT (1-2 hrs/day, 5 days/week)  and OT (1-2 hrs/day, 5 days/week) to address physical and functional deficits in the context of the above medical diagnosis(es)? Potentially Addressing deficits in the following areas: balance, endurance, locomotion, strength, transferring, bowel/bladder control, bathing, dressing, feeding, grooming, toileting and psychosocial support 5. Can the patient actively participate in an intensive therapy program of at least 3 hrs of therapy per day at least 5 days per week? Potentially 6. The potential for patient to make measurable gains while on inpatient rehab is good and fair 7. Anticipated functional outcomes upon discharge from inpatient rehab are supervision to min assist with PT, supervision to min assist with OT, n/a with SLP. 8. Estimated rehab length of stay to reach the above functional goals is: ?10-14 days 9. Does the patient have adequate social supports to accommodate these discharge functional goals? Potentially 10. Anticipated D/C setting: Home 11. Anticipated post D/C treatments: HH therapy 12. Overall Rehab/Functional Prognosis: good  RECOMMENDATIONS: This patient's condition is appropriate for continued rehabilitative care in the following setting: SNF Patient has agreed to participate in recommended program. Potentially Note that insurance prior authorization may be required for reimbursement for recommended care.  Comment: Pt was essentially at an assisted living level PTA. Vision is limited. Unclear what other services her facility can provide. Likely most appropriate for SNF.  Ranelle OysterZachary T. Natara Monfort, MD, [facility]APMR [facility] Physical [facility] Rehabilitation     04/05/2013

## 2013-04-05 NOTE — Progress Notes (Signed)
Utilization review completed.  

## 2013-04-05 NOTE — Progress Notes (Addendum)
[facility] PROGRESS NOTE  GISSELL Shea ZOX:[id number] DOB: [birth year] DOA: 04/01/2013 PCP: Estanislado Pandy, MD  Brief narrative  58 y.o. Female with uncontrolled DM who was seen at [facility] in [place] and transferred to [facility] for further evalauation and treatment of an Ischemic and gangrenous Left Diabetic Foot ulcer. Patient reports that the ulcer began several months about 1 month ago as a blister on the side of her left foot and 5th toe and an ulcer on the left lateral ankle. The ulcer of her toe worsened over that time period and began to turn black and drain a foul smelling drainage. She denies having any fevers or chills. She was evaluated by Vascular Dr. Arbie Cookey on 03/22/2013, and by Dr. Myra Gianotti on 03/30/2013 For an ABD aortogram procedure to evaluated for Bypass Grafting potential of her Left Lower Extremity. The procedure was postponed so that she could receive treatment for an intertriginous candidal infection of her groin area and she was started on Diflucan and Nystatin Powder TID.   Assessment/Plan:  Gas Gangrene of Left Foot/diabtic Osteomyelitis/Left diabetic Foot Ulcers  reassessed by vascular sx and recommended that Patient needs left leg amputation  Taken to OR and underwent left leg BKA. tolerated well. Stable post op  Pain control. PT/OT  On empiric- IV Vancomycin and Cefepime.supratherapeutic vanco trough noted ( 32). Held  vanco and dose adjusted.   Uncontrolled type 2 DM  HbA1C- 8.5  SSI  Adjust lantus dose. Added premeal aspart Hold Metformin   Hyperlipidemia  Continue Pravastatin   HTN  Continue amlodipine. ARB and HCTZ on hold   CKD stage III  - monitor BUN/Cr.   Anemia-  Appears to be due to iron def . Will add iron sulphate Transfuse for hb < 7   Leukocytosis  -Improving  Candidiasis of groin  continue nystatin   Code Status: full  Family Communication: none at bedside  Disposition Plan: possible SNF in 2 days  Consultants:   Vascular sx   Procedures:  Antibiotics:  maxipime  vanco   HPI/Subjective:  denies any leg pain. Participating with PT  Objective: Filed Vitals:   04/05/13 1333  BP: 161/88  Pulse: 83  Temp: 100.1 F (37.8 C)  Resp: 17    Intake/Output Summary (Last 24 hours) at 04/05/13 1733 Last data filed at 04/05/13 1300  Gross per 24 hour  Intake    480 ml  Output      0 ml  Net    480 ml   Filed Weights   04/01/13 2100  Weight: 116.121 kg (256 lb)    Exam:  General: Middle aged female in nAD  HEENT: no pallor, moist oral mucosa  Chest: clear to auscultation b/l, no added sounds  CVS: NS1&S2, no murmurs, rubs or gallop  Abd: soft, NT, ND, BS+  Ext: warm, no edema.  Left BKA site with dressing appears clean CNS: AAOX3  Data Reviewed: Basic Metabolic Panel:  Recent Labs Lab 04/02/13 0534 04/03/13 0610 04/04/13 0418 04/04/13 1653 04/05/13 0607  NA 137 137 137  --  136*  K 4.1 4.0 4.0  --  4.1  CL 97 101 100  --  98  CO2 24 20 21   --  23  GLUCOSE 201* 279* 280*  --  246*  BUN 19 20 18   --  16  CREATININE 1.28* 1.36* 1.37* 1.36* 1.30*  CALCIUM 8.9 8.4 8.4  --  8.4   Liver Function Tests: No results found for this  basename: AST, ALT, ALKPHOS, BILITOT, PROT, ALBUMIN,  in the last 168 hours No results found for this basename: LIPASE, AMYLASE,  in the last 168 hours No results found for this basename: AMMONIA,  in the last 168 hours CBC:  Recent Labs Lab 04/02/13 0534 04/03/13 0610 04/04/13 0418 04/04/13 1653 04/05/13 0607  WBC 16.1* 16.0* 16.1* 15.6* 12.9*  HGB 9.1* 8.7* 8.0* 8.1* 7.7*  HCT 28.0* 26.7* 24.7* 24.7* 23.6*  MCV 86.4 85.9 84.9 85.5 84.6  PLT 320 315 308 329 350   Cardiac Enzymes: No results found for this basename: CKTOTAL, CKMB, CKMBINDEX, TROPONINI,  in the last 168 hours BNP (last 3 results) No results found for this basename: PROBNP,  in the last 8760 hours CBG:  Recent Labs Lab 04/04/13 1627 04/04/13 2115 04/05/13 0633  04/05/13 1154 04/05/13 1625  GLUCAP 228* 285* 232* 271* 247*    Recent Results (from the past 240 hour(s))  SURGICAL PCR SCREEN     Status: None   Collection Time    04/03/13 11:56 PM      Result Value Range Status   MRSA, PCR NEGATIVE  NEGATIVE Final   Staphylococcus aureus NEGATIVE  NEGATIVE Final   Comment:            The Xpert SA Assay (FDA     approved for NASAL specimens     in patients over 58 years of age),     is one component of     a comprehensive surveillance     program.  Test performance has     been validated by [facility] for patients greater     than or equal to 58 year old.     It is not intended     to diagnose infection nor to     guide or monitor treatment.     Studies: No results found.  Scheduled Meds: . amLODipine  2.5 mg Oral Daily  . ceFEPime (MAXIPIME) IV  1 g Intravenous Q12H  . darifenacin  15 mg Oral Daily  . docusate sodium  100 mg Oral Daily  . enoxaparin (LOVENOX) injection  40 mg Subcutaneous Q24H  . famotidine  20 mg Oral BID  . feeding supplement (GLUCERNA SHAKE)  237 mL Oral TID BM  . ferrous sulfate  325 mg Oral Q breakfast  . fluconazole  200 mg Oral Daily  . fluconazole  200 mg Oral Daily  . gabapentin  300 mg Oral QHS  . insulin aspart  0-15 Units Subcutaneous TID WC  . insulin aspart  3 Units Subcutaneous TID WC  . [START ON 04/06/2013] insulin glargine  15 Units Subcutaneous Daily  . latanoprost  1 drop Both Eyes QHS  . metoprolol succinate  50 mg Oral Q breakfast  . nystatin   Topical TID  . pantoprazole  40 mg Oral Daily  . potassium chloride SA  20 mEq Oral Daily  . simvastatin  10 mg Oral q1800  . vancomycin  1,500 mg Intravenous Q24H   Continuous Infusions: . sodium chloride 75 mL/hr at 04/04/13 0500  . sodium chloride         Time spent: 25 minutes    Margaux Engen  [facility] Pager [phone]. If 7PM-7AM, please contact night-coverage at [email or web address], password [email or web address] 04/05/2013, 5:33 PM   LOS: 4 days

## 2013-04-05 NOTE — Progress Notes (Signed)
Clinical Social Work Department CLINICAL SOCIAL WORK PLACEMENT NOTE 04/05/2013  Patient:  Ronna PolioLONG,Ezma N  Account Number:  [id number] Admit date:  04/01/2013  Clinical Social Worker:  Sharol HarnessPOONUM Aldwin Micalizzi, Theresia MajorsLCSWA  Date/time:  04/05/2013 11:30 AM  Clinical Social Work is seeking post-discharge placement for this patient at the following level of care:   SKILLED NURSING   (*CSW will update this form in Epic as items are completed)   04/05/2013  Patient/family provided with [facility] Department of Clinical Social Work's list of facilities offering this level of care within the geographic area requested by the patient (or if unable, by the patient's family).  04/05/2013  Patient/family informed of their freedom to choose among providers that offer the needed level of care, that participate in Medicare, Medicaid or managed care program needed by the patient, have an available bed and are willing to accept the patient.  04/05/2013  Patient/family informed of [facility]' ownership interest in [facility], as well as of the fact that they are under no obligation to receive care at this facility.  PASARR submitted to EDS on EXISTING PASARR number received from EDS on   FL2 transmitted to all facilities in geographic area requested by pt/family on  04/05/2013 FL2 transmitted to all facilities within larger geographic area on   Patient informed that his/her managed care company has contracts with or will negotiate with  certain facilities, including the following:     Patient/family informed of bed offers received:   Patient chooses bed at  Physician recommends and patient chooses bed at    Patient to be transferred to  on   Patient to be transferred to facility by   The following physician request were entered in Epic:   Additional Comments:

## 2013-04-05 NOTE — Progress Notes (Signed)
Inpatient Diabetes Program Recommendations  AACE/ADA: New Consensus Statement on Inpatient Glycemic Control (2013)  Target Ranges:  Prepandial:   less than 140 mg/dL      Peak postprandial:   less than 180 mg/dL (1-2 hours)      Critically ill patients:  140 - 180 mg/dL   Results for Emily Shea, Emily Shea (MRN [id number]) as of 04/05/2013 11:48  Ref. Range 04/04/2013 06:46 04/04/2013 11:01 04/04/2013 13:26 04/04/2013 15:19 04/04/2013 16:27 04/04/2013 21:15 04/05/2013 06:33  Glucose-Capillary Latest Range: 70-99 mg/dL 846274 (H) 962235 (H) 952219 (H) 216 (H) 228 (H) 285 (H) 232 (H)   Inpatient Diabetes Program Recommendations Insulin - Basal: Noted that Lantus 10 units daily was ordered on 04/03/13.  In reviewing the chart noted Lantus was not given yesterday.  Patient was NPO yesterday for surgery yesterday afternoon. Anticipate need for increased dose of basal.  Please consider increasing Lantus to 13 units daily. Correction (SSI): Please consider increasing Novolog correction scale to Resistant scale ACHS.  Thanks, Orlando PennerMarie Marquetta Weiskopf, RN, MSN, CCRN Diabetes Coordinator Inpatient Diabetes Program [phone] (Team Pager) [phone] (AP office) [phone] [place]([facility] office)

## 2013-04-05 NOTE — Evaluation (Signed)
Physical Therapy Evaluation Patient Details Name: Emily Shea MRN: [id number] DOB: November 19, 1955 Today's Date: 04/05/2013 Time: [id number] PT Time Calculation (min): 44 min  PT Assessment / Plan / Recommendation History of Present Illness  pt s/p BKA 04/04/13  Clinical Impression  Pt limited by weakness in R LE.  Pt unable to safely perform transfer with +2 total A.  Pt will need lift equipment for transfers at this time.  Pt will benefit from skilled PT to address deficits and decrease burden of care for recommended d/c to SNF.    PT Assessment  Patient needs continued PT services    Follow Up Recommendations  SNF    Does the patient have the potential to tolerate intense rehabilitation      Barriers to Discharge Decreased caregiver support needs 24 hour physical assist    Equipment Recommendations  None recommended by PT    Recommendations for Other Services OT consult   Frequency Min 3X/week    Precautions / Restrictions Precautions Precautions: Fall Restrictions Weight Bearing Restrictions: No   Pertinent Vitals/Pain No c/o pain, pt c/o burning in residual limb, PT educated pt on gentle rubbing/tapping to reduce residual limb pain.      Mobility  Bed Mobility Bed Mobility: Supine to Sit;Sit to Supine Supine to Sit: 2: Max assist Sit to Supine: 2: Max assist Transfers Transfers: Stand to Sit;Sit to Stand Details for Transfer Assistance: attempted sit to stand and squat pivot and scoot pivot transfer to recliner with +2 assist.  pt with difficulty bearing wt on R LE and unable to assist with transfer, pt unable to transfer despite +2 assist    Exercises     PT Diagnosis: Generalized weakness;Difficulty walking  PT Problem List: Decreased strength;Decreased activity tolerance;Decreased balance;Decreased mobility;Decreased knowledge of use of DME PT Treatment Interventions: DME instruction;Gait training;Patient/family education;Wheelchair mobility training;Functional  mobility training;Therapeutic activities;Modalities;Therapeutic exercise;Balance training;Neuromuscular re-education     PT Goals(Current goals can be found in the care plan section) Acute Rehab PT Goals Patient Stated Goal: none stated PT Goal Formulation: With patient Time For Goal Achievement: 04/19/13 Potential to Achieve Goals: Good  Visit Information  Last PT Received On: 04/05/13 Assistance Needed: +2 History of Present Illness: pt s/p BKA 04/04/13       Prior Functioning  Home Living Family/patient expects to be discharged to:: Other (Comment) ("adult care facility") Prior Function Level of Independence: Independent with assistive device(s) Comments: pt states she used RW or cane Communication Communication: Expressive difficulties (historoy of CVA)    Cognition  Cognition Arousal/Alertness: Awake/alert Behavior During Therapy: WFL for tasks assessed/performed Overall Cognitive Status: History of cognitive impairments - at baseline    Extremity/Trunk Assessment Upper Extremity Assessment Upper Extremity Assessment: Generalized weakness Lower Extremity Assessment Lower Extremity Assessment: Generalized weakness Cervical / Trunk Assessment Cervical / Trunk Assessment: Normal   Balance Static Sitting Balance Static Sitting - Balance Support: Bilateral upper extremity supported Static Sitting - Level of Assistance: 5: Stand by assistance Static Sitting - Comment/# of Minutes: pt sat edge of bed > 10 minutes to rest during trials of sit to stands and transfers  End of Session PT - End of Session Equipment Utilized During Treatment: Gait belt Activity Tolerance: Patient limited by fatigue Patient left: in bed;with call bell/phone within reach Nurse Communication: Mobility status  GP     Emily Shea 04/05/2013, 9:12 AM

## 2013-04-05 NOTE — Progress Notes (Signed)
NUTRITION FOLLOW UP  Intervention:   Glucerna Shake po TID, each supplement provides 220 kcal and 10 grams of protein  Nutrition Dx:   Inadequate oral intake related to acute illness as evidenced by meal completion <25%; ongoing.   Goal:  Pt to meet >/= 90% of their estimated nutrition needs, not met.   Monitor:  PO intake, wound healing, weight, labs, supplement acceptance  Assessment:   Pt transferred from [facility] due to progressing left food gangrene. 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  Pt with hx of being legally blind.  Pt continues to report a very poor appetite. Pt did drink a vanilla ensure and is willing to drink a supplement while her appetite is poor. Rehab recommending SNF placement at d/c.  Pt's blood sugars remain elevated all being >216.   Height: Ht Readings from Last 1 Encounters:  04/01/13 5' 11"  (1.803 m)    Weight Status:   Wt Readings from Last 1 Encounters:  04/01/13 256 lb (116.121 kg)  No new weight  Re-estimated needs:  Kcal: 2100-2300  Protein: 100-115 grams  Fluid: > 2.1 L/day  Skin: left BKA  Diet Order: Carb Control Meal Completion: 25-50%   Intake/Output Summary (Last 24 hours) at 04/05/13 1304 Last data filed at 04/05/13 0800  Gross per 24 hour  Intake   1480 ml  Output    200 ml  Net   1280 ml    Last BM: 1/3   Labs:   Recent Labs Lab 04/03/13 0610 04/04/13 0418 04/04/13 1653 04/05/13 0607  NA 137 137  --  136*  K 4.0 4.0  --  4.1  CL 101 100  --  98  CO2 20 21  --  23  BUN 20 18  --  16  CREATININE 1.36* 1.37* 1.36* 1.30*  CALCIUM 8.4 8.4  --  8.4  GLUCOSE 279* 280*  --  246*    CBG (last 3)   Recent Labs  04/04/13 2115 04/05/13 0633 04/05/13 1154  GLUCAP 285* 232* 271*    Scheduled Meds: . amLODipine  2.5 mg Oral Daily  . ceFEPime (MAXIPIME) IV  1 g Intravenous Q12H  . darifenacin  15 mg Oral Daily  . docusate sodium  100 mg Oral Daily  . enoxaparin (LOVENOX) injection  40 mg Subcutaneous Q24H  .  famotidine  20 mg Oral BID  . ferrous sulfate  325 mg Oral Q breakfast  . fluconazole  200 mg Oral Daily  . fluconazole  200 mg Oral Daily  . gabapentin  300 mg Oral QHS  . insulin aspart  0-15 Units Subcutaneous TID WC  . insulin aspart  0-5 Units Subcutaneous QHS  . insulin glargine  10 Units Subcutaneous Daily  . latanoprost  1 drop Both Eyes QHS  . metoprolol succinate  50 mg Oral Q breakfast  . nystatin   Topical TID  . pantoprazole  40 mg Oral Daily  . potassium chloride SA  20 mEq Oral Daily  . simvastatin  10 mg Oral q1800  . vancomycin  1,500 mg Intravenous Q24H    Continuous Infusions: . sodium chloride 75 mL/hr at 04/04/13 0500  . sodium chloride      [place], [place], [place] Pager [phone] After Hours Pager

## 2013-04-05 NOTE — Progress Notes (Signed)
Clinical Social Work Department BRIEF PSYCHOSOCIAL ASSESSMENT 04/05/2013  Patient:  Emily Shea,Emily Shea     Account Number:  [id number]     Admit date:  04/01/2013  Clinical Social Worker:  Harless NakayamaAMBELAL,Jon Lall, LCSWA  Date/Time:  04/05/2013 11:00 AM  Referred by:  Physician  Date Referred:  04/05/2013 Referred for  SNF Placement   Other Referral:   Interview type:  Patient Other interview type:    PSYCHOSOCIAL DATA Living Status:  FACILITY Admitted from facility:  OTHER Level of care:  Assisted Living Primary support name:   Primary support relationship to patient:   Degree of support available:   Pt reports she was residing at [facility] in [place] prior to admission.    CURRENT CONCERNS Current Concerns  Post-Acute Placement   Other Concerns:    SOCIAL WORK ASSESSMENT / PLAN CSW spoke with pt about recommendation for SNF. Pt is aware of recommendation and agreeable. CSW explained SNF referral process. [facility] is [facility]. Pt does have a strong preference for [facility].   Assessment/plan status:  Psychosocial Support/Ongoing Assessment of Needs Other assessment/ plan:   Information/referral to community resources:   SNF list denied.    PATIENT'S/FAMILY'S RESPONSE TO PLAN OF CARE: Pt is agreeable to SNF       Kahlen Morais, LCSWA [phone]

## 2013-04-05 NOTE — Progress Notes (Signed)
ANTIBIOTIC CONSULT NOTE - FOLLOW UP  Pharmacy Consult for vancomycin Indication: left foot gangrene, osteo  Allergies  Allergen Reactions  . Procardia [Nifedipine] Other (See Comments)    Constipation, gum irritation    Patient Measurements: Height: 5\' 11"  (180.3 cm) Weight: 256 lb (116.121 kg) IBW/kg (Calculated) : 70.8  Vital Signs: Temp: 98.9 F (37.2 C) (01/06 0554) BP: 156/87 mmHg (01/06 0554) Pulse Rate: 82 (01/06 0554) Intake/Output from previous day: 01/05 0701 - 01/06 0700 In: 1240 [P.O.:240; I.V.:1000] Out: 900 [Urine:700; Blood:200] Intake/Output from this shift: Total I/O In: 240 [P.O.:240] Out: -   Labs:  Recent Labs  04/04/13 0418 04/04/13 1653 04/05/13 0607  WBC 16.1* 15.6* 12.9*  HGB 8.0* 8.1* 7.7*  PLT 308 329 350  CREATININE 1.37* 1.36* 1.30*   Estimated Creatinine Clearance: 67 ml/min (by C-G formula based on Cr of 1.3).  Recent Labs  04/04/13 1653  VANCOTROUGH 32.7*     Microbiology: Recent Results (from the past 720 hour(s))  SURGICAL PCR SCREEN     Status: None   Collection Time    04/03/13 11:56 PM      Result Value Range Status   MRSA, PCR NEGATIVE  NEGATIVE Final   Staphylococcus aureus NEGATIVE  NEGATIVE Final   Comment:            The Xpert SA Assay (FDA     approved for NASAL specimens     in patients over 58 years of age),     is one component of     a comprehensive surveillance     program.  Test performance has     been validated by [facility] for patients greater     than or equal to 58 year old.     It is not intended     to diagnose infection nor to     guide or monitor treatment.    Anti-infectives   Start     Dose/Rate Route Frequency Ordered Stop   04/04/13 1630  fluconazole (DIFLUCAN) tablet 200 mg     200 mg Oral Daily 04/04/13 1627     04/02/13 1400  vancomycin (VANCOCIN) 1,500 mg in sodium chloride 0.9 % 500 mL IVPB     1,500 mg 250 mL/hr over 120 Minutes Intravenous Every 24 hours 04/01/13  2315     04/02/13 1000  ceFEPIme (MAXIPIME) 1 g in dextrose 5 % 50 mL IVPB     1 g 100 mL/hr over 30 Minutes Intravenous Every 12 hours 04/01/13 2224     04/02/13 0000  fluconazole (DIFLUCAN) tablet 200 mg     200 mg Oral Daily 04/01/13 2350        Assessment: Patient is a 58 y.o F on vancomycin and cefepime for left foot gangrene and osteomyelitis.  S/p left BKA on 1/05.  Patient received vancomycin dose around 2 PM on 1/5 and level was drawn late at ~5PM. It came back as 32.7 but this was actually a peak and not a trough level.  True trough level obtained today came back as 14.5.  Goal of Therapy:  Vancomycin trough level 15-20 mcg/ml  Plan:  1) continue 1500mg  IV q24h (with accumulation, will anticipate level to be within therapeutic range over the next few days)  Erron Wengert P 04/05/2013,9:43 AM

## 2013-04-06 ENCOUNTER — Encounter (HOSPITAL_COMMUNITY): Payer: Self-pay | Admitting: Vascular Surgery

## 2013-04-06 LAB — GLUCOSE, CAPILLARY
GLUCOSE-CAPILLARY: 288 mg/dL — AB (ref 70–99)
Glucose-Capillary: 277 mg/dL — ABNORMAL HIGH (ref 70–99)
Glucose-Capillary: 276 mg/dL — ABNORMAL HIGH (ref 70–99)
Glucose-Capillary: 198 mg/dL — ABNORMAL HIGH (ref 70–99)

## 2013-04-06 LAB — CBC
HCT: 23.9 % — ABNORMAL LOW (ref 36.0–46.0)
Hemoglobin: 7.8 g/dL — ABNORMAL LOW (ref 12.0–15.0)
MCH: 27.6 pg (ref 26.0–34.0)
MCHC: 32.6 g/dL (ref 30.0–36.0)
MCV: 84.5 fL (ref 78.0–100.0)
Platelets: 384 10*3/uL (ref 150–400)
RBC: 2.83 MIL/uL — AB (ref 3.87–5.11)
RDW: 14.9 % (ref 11.5–15.5)
WBC: 13.6 10*3/uL — ABNORMAL HIGH (ref 4.0–10.5)

## 2013-04-06 LAB — BASIC METABOLIC PANEL
BUN: 14 mg/dL (ref 6–23)
CALCIUM: 8.8 mg/dL (ref 8.4–10.5)
CHLORIDE: 99 meq/L (ref 96–112)
CO2: 24 meq/L (ref 19–32)
Creatinine, Ser: 1.23 mg/dL — ABNORMAL HIGH (ref 0.50–1.10)
GFR calc Af Amer: 55 mL/min — ABNORMAL LOW (ref >90)
GFR calc non Af Amer: 48 mL/min — ABNORMAL LOW (ref >90)
GLUCOSE: 276 mg/dL — AB (ref 70–99)
Potassium: 4.1 mEq/L (ref 3.7–5.3)
SODIUM: 136 meq/L — AB (ref 137–147)

## 2013-04-06 MED ORDER — INSULIN ASPART 100 UNIT/ML ~~LOC~~ SOLN
0.0000 [IU] | Freq: Three times a day (TID) | SUBCUTANEOUS | Status: DC
  Administered 2013-04-06: 11 [IU] via SUBCUTANEOUS
  Administered 2013-04-07: 20 [IU] via SUBCUTANEOUS
  Administered 2013-04-07: 7 [IU] via SUBCUTANEOUS

## 2013-04-06 MED ORDER — INSULIN GLARGINE 100 UNIT/ML ~~LOC~~ SOLN
18.0000 [IU] | Freq: Every day | SUBCUTANEOUS | Status: DC
  Administered 2013-04-07: 18 [IU] via SUBCUTANEOUS
  Filled 2013-04-06: qty 0.18

## 2013-04-06 MED ORDER — AMLODIPINE BESYLATE 5 MG PO TABS
5.0000 mg | ORAL_TABLET | Freq: Every day | ORAL | Status: DC
  Administered 2013-04-07: 5 mg via ORAL
  Filled 2013-04-06: qty 1

## 2013-04-06 MED ORDER — INSULIN ASPART 100 UNIT/ML ~~LOC~~ SOLN
0.0000 [IU] | Freq: Every day | SUBCUTANEOUS | Status: DC
Start: 2013-04-06 — End: 2013-04-07

## 2013-04-06 MED ORDER — INSULIN ASPART 100 UNIT/ML ~~LOC~~ SOLN
4.0000 [IU] | Freq: Three times a day (TID) | SUBCUTANEOUS | Status: DC
  Administered 2013-04-06 – 2013-04-07 (×2): 4 [IU] via SUBCUTANEOUS

## 2013-04-06 NOTE — Progress Notes (Signed)
[facility] PROGRESS NOTE  Emily Shea MVH:[id number] DOB: [birth year] DOA: 04/01/2013 PCP: [name],PAUL W, MD  Brief narrative  58 y.o. Female with uncontrolled DM who was seen [facility] [place] and transferred to [facility] for further evalauation and treatment of an Ischemic and gangrenous Left Diabetic Foot ulcer. Patient reports that the ulcer began several months about 1 month ago as a blister on the side of her left foot and 5th toe and an ulcer on the left lateral ankle. The ulcer of her toe worsened over that time period and began to turn black and drain a foul smelling drainage. She denies having any fevers or chills. She was evaluated by Vascular Dr. [name] on 03/22/2013, and by Dr. [name] on 03/30/2013 For an ABD aortogram procedure to evaluated for Bypass Grafting potential of her Left Lower Extremity. The procedure was postponed so that she could receive treatment for an intertriginous candidal infection of her groin area and she was started on Diflucan and Nystatin Powder TID.   Assessment/Plan:  Gas Gangrene of Left Foot/diabtic Osteomyelitis/Left diabetic Foot Ulcers  reassessed by vascular sx and recommended that Patient needs left leg amputation  Taken to OR and underwent left leg BKA. tolerated well. Stable post op  Pain control. PT/OT  On empiric- IV Vancomycin and Cefepime.supratherapeutic vanco trough noted ( 32). Held  vanco and dose adjusted.   Uncontrolled type 2 DM  HbA1C- 8.5  SSI resistant scale CBG (last 3)   Recent Labs  04/05/13 2128 04/06/13 0636 04/06/13 1109  GLUCAP 266* 277* 288*     Adjusted lantus dose. Added premeal aspart Hold Metformin   Hyperlipidemia  Continue Pravastatin   HTN  Continue amlodipine. ARB and HCTZ on hold   CKD stage III  - monitor BUN/Cr.   Anemia-  Appears to be due to iron def . Will add iron sulphate Transfuse for hb < 7   Leukocytosis  -Improving  Candidiasis of groin  continue nystatin    Code Status: full  Family Communication: none at bedside  Disposition Plan: possible SNF in 2 days  Consultants:  Vascular sx   Procedures:  Antibiotics:  maxipime  vanco   HPI/Subjective:  denies any leg pain. Participating with PT  Objective: Filed Vitals:   04/06/13 1059  BP: 154/76  Pulse: 81  Temp: 99.9 F (37.7 C)  Resp: 16    Intake/Output Summary (Last 24 hours) at 04/06/13 1404 Last data filed at 04/06/13 0916  Gross per 24 hour  Intake    360 ml  Output      0 ml  Net    360 ml   Filed Weights   04/01/13 2100  Weight: 116.121 kg (256 lb)    Exam:  General: Middle aged female in nAD  HEENT: no pallor, moist oral mucosa  Chest: clear to auscultation b/l, no added sounds  CVS: NS1&S2, no murmurs, rubs or gallop  Abd: soft, NT, ND, BS+  Ext: warm, no edema.  Left BKA site with dressing appears clean CNS: AAOX3  Data Reviewed: Basic Metabolic Panel:  Recent Labs Lab 04/02/13 0534 04/03/13 0610 04/04/13 0418 04/04/13 1653 04/05/13 0607 04/06/13 0330  NA 137 137 137  --  136* 136*  K 4.1 4.0 4.0  --  4.1 4.1  CL 97 101 100  --  98 99  CO2 24 20 21   --  23 24  GLUCOSE 201* 279* 280*  --  246* 276*  BUN 19 20  18  --  16 14  CREATININE 1.28* 1.36* 1.37* 1.36* 1.30* 1.23*  CALCIUM 8.9 8.4 8.4  --  8.4 8.8   Liver Function Tests: No results found for this basename: AST, ALT, ALKPHOS, BILITOT, PROT, ALBUMIN,  in the last 168 hours No results found for this basename: LIPASE, AMYLASE,  in the last 168 hours No results found for this basename: AMMONIA,  in the last 168 hours CBC:  Recent Labs Lab 04/03/13 0610 04/04/13 0418 04/04/13 1653 04/05/13 0607 04/06/13 0330  WBC 16.0* 16.1* 15.6* 12.9* 13.6*  HGB 8.7* 8.0* 8.1* 7.7* 7.8*  HCT 26.7* 24.7* 24.7* 23.6* 23.9*  MCV 85.9 84.9 85.5 84.6 84.5  PLT 315 308 329 350 384   Cardiac Enzymes: No results found for this basename: CKTOTAL, CKMB, CKMBINDEX, TROPONINI,  in the last 168  hours BNP (last 3 results) No results found for this basename: PROBNP,  in the last 8760 hours CBG:  Recent Labs Lab 04/05/13 1154 04/05/13 1625 04/05/13 2128 04/06/13 0636 04/06/13 1109  GLUCAP 271* 247* 266* 277* 288*    Recent Results (from the past 240 hour(s))  SURGICAL PCR SCREEN     Status: None   Collection Time    04/03/13 11:56 PM      Result Value Range Status   MRSA, PCR NEGATIVE  NEGATIVE Final   Staphylococcus aureus NEGATIVE  NEGATIVE Final   Comment:            The Xpert SA Assay (FDA     approved for NASAL specimens     in patients over 72 years of age),     is one component of     a comprehensive surveillance     program.  Test performance has     been validated by [employer] for patients greater     than or equal to 20 year old.     It is not intended     to diagnose infection nor to     guide or monitor treatment.     Studies: No results found.  Scheduled Meds: . amLODipine  2.5 mg Oral Daily  . ceFEPime (MAXIPIME) IV  1 g Intravenous Q12H  . darifenacin  15 mg Oral Daily  . docusate sodium  100 mg Oral Daily  . enoxaparin (LOVENOX) injection  40 mg Subcutaneous Q24H  . famotidine  20 mg Oral BID  . feeding supplement (GLUCERNA SHAKE)  237 mL Oral TID BM  . ferrous sulfate  325 mg Oral BID WC  . fluconazole  200 mg Oral Daily  . fluconazole  200 mg Oral Daily  . gabapentin  300 mg Oral QHS  . insulin aspart  0-15 Units Subcutaneous TID WC  . insulin aspart  3 Units Subcutaneous TID WC  . insulin glargine  15 Units Subcutaneous Daily  . latanoprost  1 drop Both Eyes QHS  . metoprolol succinate  50 mg Oral Q breakfast  . nystatin   Topical TID  . pantoprazole  40 mg Oral Daily  . potassium chloride SA  20 mEq Oral Daily  . simvastatin  10 mg Oral q1800  . vancomycin  1,500 mg Intravenous Q24H   Continuous Infusions: . sodium chloride         Time spent: 25 minutes    Darrin Koman  [facility] Pager [phone]. If  7PM-7AM, please contact night-coverage at [email or web address], password [facility] 04/06/2013, 2:04 PM  LOS: 5 days

## 2013-04-06 NOTE — Progress Notes (Signed)
Rehab admissions - Evaluated for possible admission.  Please see rehab MD consult recommending SNF.  Agree with pursuit of SNF.  Call me for questions.  #[phone]

## 2013-04-06 NOTE — Progress Notes (Signed)
Subjective: Interval History: none..   Objective: Vital signs in last 24 hours: Temp:  [99.4 F (37.4 C)-100.1 F (37.8 C)] 99.9 F (37.7 C) (01/07 1059) Pulse Rate:  [81-85] 81 (01/07 1059) Resp:  [16-17] 16 (01/07 1059) BP: (154-178)/(76-88) 154/76 mmHg (01/07 1059) SpO2:  [92 %-95 %] 92 % (01/07 1059)  Intake/Output from previous day: 01/06 0701 - 01/07 0700 In: 960 [P.O.:480; I.V.:480] Out: -  Intake/Output this shift:    Left BKA dressing removed.  Healing well  Lab Results:  Recent Labs  04/05/13 0607 04/06/13 0330  WBC 12.9* 13.6*  HGB 7.7* 7.8*  HCT 23.6* 23.9*  PLT 350 384   BMET  Recent Labs  04/05/13 0607 04/06/13 0330  NA 136* 136*  K 4.1 4.1  CL 98 99  CO2 23 24  GLUCOSE 246* 276*  BUN 16 14  CREATININE 1.30* 1.23*  CALCIUM 8.4 8.8    Studies/Results: No results found. Anti-infectives: Anti-infectives   Start     Dose/Rate Route Frequency Ordered Stop   04/04/13 1630  fluconazole (DIFLUCAN) tablet 200 mg     200 mg Oral Daily 04/04/13 1627     04/02/13 1400  vancomycin (VANCOCIN) 1,500 mg in sodium chloride 0.9 % 500 mL IVPB     1,500 mg 250 mL/hr over 120 Minutes Intravenous Every 24 hours 04/01/13 2315     04/02/13 1000  ceFEPIme (MAXIPIME) 1 g in dextrose 5 % 50 mL IVPB     1 g 100 mL/hr over 30 Minutes Intravenous Every 12 hours 04/01/13 2224     04/02/13 0000  fluconazole (DIFLUCAN) tablet 200 mg     200 mg Oral Daily 04/01/13 2350        Assessment/Plan: s/p Procedure(s): AMPUTATION BELOW KNEE (N/A) Stable. cont PT   LOS: 5 days   Emily Shea 04/06/2013, 11:55 AM

## 2013-04-06 NOTE — Progress Notes (Signed)
Talked to Dr. Tama GanderKatherine Schorr regarding patient's consistently high BP. Dr. Clearence PedSchorr says to give Norvasc and Metoprolol early today.

## 2013-04-06 NOTE — Progress Notes (Addendum)
Inpatient Diabetes Program Recommendations  AACE/ADA: New Consensus Statement on Inpatient Glycemic Control (2013)  Target Ranges:  Prepandial:   less than 140 mg/dL      Peak postprandial:   less than 180 mg/dL (1-2 hours)      Critically ill patients:  140 - 180 mg/dL   Reason for Visit: CBG's continue to be elevated.  Agree with increased Lantus to 15 units.  May need further titration up based on fasting CBG's.  Patient will likely need insulin at discharge based on A1C.  If insulin to be ordered at discharge, please order insulin teaching to be done by bedside RN.  Thanks, Beryl MeagerJenny Treina Arscott, RN, BC-ADM Inpatient Diabetes Coordinator Pager [phone]   Addendum 1400: Spoke to patient and she states that [facility]  States that she has a difficult time seeing.  In the past she was on Victoza for her diabetes, however she states that CBG's were still high.  She has family history of diabetes and amputations.  States that her primary MD tried to put her on insulin but she was afraid of going low.  She is now okay with being on insulin since she will have monitoring and help.

## 2013-04-07 LAB — GLUCOSE, CAPILLARY
GLUCOSE-CAPILLARY: 363 mg/dL — AB (ref 70–99)
Glucose-Capillary: 235 mg/dL — ABNORMAL HIGH (ref 70–99)

## 2013-04-07 MED ORDER — FERROUS SULFATE 325 (65 FE) MG PO TABS: 325.0000 mg | ORAL_TABLET | Freq: Two times a day (BID) | ORAL | Status: DC

## 2013-04-07 MED ORDER — CIPROFLOXACIN HCL 500 MG PO TABS: 500.0000 mg | ORAL_TABLET | Freq: Two times a day (BID) | ORAL | Status: DC

## 2013-04-07 MED ORDER — INSULIN ASPART 100 UNIT/ML ~~LOC~~ SOLN: 4.0000 [IU] | Freq: Three times a day (TID) | SUBCUTANEOUS | Status: DC

## 2013-04-07 MED ORDER — GLUCERNA SHAKE PO LIQD: 237.0000 mL | Freq: Three times a day (TID) | ORAL | Status: DC

## 2013-04-07 MED ORDER — INSULIN GLARGINE 100 UNIT/ML ~~LOC~~ SOLN: 18.0000 [IU] | Freq: Every day | SUBCUTANEOUS | Status: DC

## 2013-04-07 MED ORDER — INSULIN ASPART 100 UNIT/ML ~~LOC~~ SOLN: SUBCUTANEOUS | Status: DC

## 2013-04-07 MED ORDER — DSS 100 MG PO CAPS
100.0000 mg | ORAL_CAPSULE | Freq: Every day | ORAL | Status: DC
Start: 2013-04-07 — End: 2013-12-21

## 2013-04-07 MED ORDER — PANTOPRAZOLE SODIUM 40 MG PO TBEC: 40.0000 mg | DELAYED_RELEASE_TABLET | Freq: Every day | ORAL | Status: DC

## 2013-04-07 MED ORDER — HYDROCODONE-ACETAMINOPHEN 10-325 MG PO TABS: 1.0000 | ORAL_TABLET | Freq: Four times a day (QID) | ORAL | Status: DC | PRN

## 2013-04-07 MED ORDER — HYDRALAZINE HCL 25 MG PO TABS: 25.0000 mg | ORAL_TABLET | Freq: Three times a day (TID) | ORAL | Status: DC

## 2013-04-07 MED ORDER — AMLODIPINE BESYLATE 5 MG PO TABS: 5.0000 mg | ORAL_TABLET | Freq: Every day | ORAL | Status: AC

## 2013-04-07 NOTE — Progress Notes (Signed)
Patient discharge to [facility] (SNF) at 1548 via ambulance.  Report call to nurse Rose.

## 2013-04-07 NOTE — Progress Notes (Signed)
Physical Therapy Treatment Patient Details Name: Emily Shea MRN: [id number] DOB: 05-22-1955 Today's Date: 04/07/2013 Time: [id number] PT Time Calculation (min): 24 min  PT Assessment / Plan / Recommendation  History of Present Illness pt s/p BKA 04/04/13   PT Comments   Pt cont's to require significant +2 assist for all mobility.  Used lateral/scoot transfer with sliding board for bed>recliner today.  Pt pleasant but requires max directional cues for all tasks & encouragement to increase active participation.  Instructed Nsing to use lift to get pt back into bed.     Follow Up Recommendations  SNF     Does the patient have the potential to tolerate intense rehabilitation     Barriers to Discharge        Equipment Recommendations  None recommended by PT    Recommendations for Other Services OT consult  Frequency Min 3X/week   Progress towards PT Goals Progress towards PT goals: Progressing toward goals  Plan Current plan remains appropriate    Precautions / Restrictions Restrictions Weight Bearing Restrictions: No       Mobility  Bed Mobility Overal bed mobility: +2 for physical assistance (max assist) General bed mobility comments: HOB elevated ~60 degrees.  Max directional cues for technique & encouragement for pt to increase active participation.  (A) to lift shouldres/trunk to sitting upright & use of draw pad to pivot hips to EOB & scoot closer to EOB.   Transfers Overall transfer level: Needs assistance Equipment used:  (sliding board) Transfers: Lateral/Scoot Transfers  Lateral/Scoot Transfers: +2 physical assistance;With slide board;Max assist General transfer comment: Max directional cues & encouragement to increase active participation.  Use of draw pad to lift hips & scoot across board into recliner.        PT Goals (current goals can now be found in the care plan section) Acute Rehab PT Goals Patient Stated Goal: Pt did not state this session. PT Goal  Formulation: With patient Time For Goal Achievement: 04/19/13 Potential to Achieve Goals: Good  Visit Information  Last PT Received On: 04/07/13 Assistance Needed: +2 History of Present Illness: pt s/p BKA 04/04/13    Subjective Data  Subjective: "can you find me my shows" Patient Stated Goal: Pt did not state this session.   Cognition  Cognition Arousal/Alertness: Awake/alert Behavior During Therapy: WFL for tasks assessed/performed Overall Cognitive Status: History of cognitive impairments - at baseline    Balance     End of Session PT - End of Session Activity Tolerance: Patient tolerated treatment well Patient left: in chair;with call bell/phone within reach Nurse Communication: Mobility status;Need for lift equipment   GP     Lara MulchCooper, Thurza Kwiecinski Lynn 04/07/2013, 12:11 PM   Verdell FaceKelly Kiptyn Rafuse, PTA [phone] 04/07/2013

## 2013-04-07 NOTE — Progress Notes (Signed)
CSW (Clinical Child psychotherapistocial Worker) prepared pt dc packet and placed with shadow chart. CSW arranged non-emergent ambulance transport at 3pm. Pt, pt nurse, and facility informed. CSW signing off.  Devine Dant, LCSWA [phone]

## 2013-04-07 NOTE — Discharge Summary (Signed)
Physician Discharge Summary  Emily Shea WUJ:[id number] DOB: 1955-08-04 DOA: 04/01/2013  PCP: [name],PAUL W, MD  Admit date: 04/01/2013 Discharge date: 04/07/2013  Time spent: 35 minutes  Recommendations for Outpatient Follow-up:  1. Follow up with PCP IN ONE WEEK 2. Follow up with Dr [name] in 4 weeks and to remove sutures.  3. Follow up with CBC and BMP in 1 to 2 days to check on your renal parameters and your hemoglobin and wbc count.   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 Female with uncontrolled DM who was seen at [facility] in [place] and transferred to [name] for further evalauation and treatment of an Ischemic and gangrenous Left Diabetic Foot ulcer. Patient reports that the ulcer began several months about 1 month ago as a blister on the side of her left foot and 5th toe and an ulcer on the left lateral ankle. The ulcer of her toe worsened over that time period and began to turn black and drain a foul smelling drainage. She denies having any fevers or chills. She was evaluated by Vascular Dr. [name] on 03/22/2013, and by Dr. [name] on 03/30/2013 For an ABD aortogram procedure to evaluated for Bypass Grafting potential of her Left Lower Extremity. The procedure was postponed so that she could receive treatment for an intertriginous candidal infection of her groin area and she was started on Diflucan and Nystatin Powder TID.  She underwent amputation  of the left lower extremitiy on 04/04/12 and PT and in patient rehab recommended SNF.    Hospital Course:  Gas Gangrene of Left Foot/diabtic Osteomyelitis/Left diabetic Foot Ulcers  reassessed by vascular sx and recommended that Patient needs left leg amputation  Taken to OR and underwent left leg BKA. tolerated well. Stable post op  Pain control. PT/OT  She completed 7 days of IV antibiotics. She is being discharged on 5 more days of po ciprofloxacin to complete the course. Follow up with PCP and Dr [name] as recommended.  Uncontrolled type 2 DM  HbA1C- 8.5  SSI resistant scale  CBG (last 3)   Recent Labs   04/05/13 2128  04/06/13 0636  04/06/13 1109   GLUCAP  266*  277*  288*    Adjusted lantus dose. Added premeal aspart  Hold Metformin and victoza on discharge.  Hyperlipidemia  Continue Pravastatin  HTN  Continue amlodipine. ARB and HCTZ on hold . Added hydralazine .  CKD stage III  - monitor BUN/Cr.  Anemia-  Appears to be due to iron def . Will add iron sulphate   Leukocytosis  -Improving . Recommend checking cbc in 1 to 2 days.  Candidiasis of groin  continue nystatin . She completed the course of    Procedures:  LEFT BKA ON 1/5  Consultations:  VASCULAR.   Discharge Exam: Filed Vitals:   04/07/13 0533  BP: 141/62  Pulse: 85  Temp: 98.4 F (36.9 C)  Resp: 18    General: Middle aged female in nAD  HEENT: no pallor, moist oral mucosa  Chest: clear to auscultation b/l, no added sounds  CVS: NS1&S2, no murmurs, rubs or gallop  Abd: soft, NT, ND, BS+  Ext: warm, no edema.  Left BKA site with dressing appears clean  CNS: AAOX3   Discharge Instructions     Medication List    STOP taking these medications       fluconazole 200 MG tablet  Commonly known as:  DIFLUCAN     losartan-hydrochlorothiazide 100-25 MG per tablet  Commonly known as:  HYZAAR     metFORMIN 850 MG tablet  Commonly known as:  GLUCOPHAGE     VICTOZA [place]      TAKE these  medications       amLODipine 5 MG tablet  Commonly known as:  NORVASC  Take 1 tablet (5 mg total) by mouth daily.     aspirin 81 MG tablet  Take 81 mg by mouth daily.     ciclopirox 8 % solution  Commonly known as:  PENLAC  Apply 1 application topically at bedtime. Apply over nail and surrounding skin. Apply daily over previous coat. After seven (7) days, may remove with alcohol and continue cycle.     ciprofloxacin 500 MG tablet  Commonly known as:  CIPRO  Take 1 tablet (500 mg total) by mouth 2 (two) times daily.     DSS 100 MG Caps  Take 100 mg by mouth daily.     feeding supplement (GLUCERNA SHAKE) Liqd  Take 237 mLs by mouth 3 (three) times daily between meals.     ferrous sulfate 325 (65 FE) MG tablet  Take 1 tablet (325 mg total) by mouth 2 (two) times daily with a meal.     gabapentin 300 MG capsule  Commonly known as:  NEURONTIN  Take 300 mg by mouth daily.     hydrALAZINE 25 MG tablet  Commonly known as:  APRESOLINE  Take 1 tablet (25 mg total) by mouth 3 (three) times daily.     HYDROcodone-acetaminophen 10-325 MG per tablet  Commonly known as:  NORCO  Take 1 tablet by mouth every 6 (six) hours as needed for moderate pain.     insulin aspart 100 UNIT/ML injection  Commonly known as:  novoLOG  - CBG 70 - 120: 0 units  - CBG 121 - 150: 3 units  - CBG 151 - 200: 4 units  - CBG 201 - 250: 7 units  - CBG 251 - 300: 11 units  - CBG 301 - 350: 15 units  - CBG 351 - 400: 20 units     insulin aspart 100 UNIT/ML injection  Commonly known as:  novoLOG  Inject 4 Units into the skin 3 (three) times daily with meals.     insulin glargine 100 UNIT/ML injection  Commonly known as:  LANTUS  Inject 0.18 mLs (18 Units total) into the skin daily.     latanoprost 0.005 % ophthalmic solution  Commonly known as:  XALATAN  Place 1 drop into both eyes at bedtime.     metoprolol succinate 50 MG 24 hr tablet  Commonly known as:  TOPROL-XL  Take 50 mg by mouth  daily. Take with or immediately following a meal.     nystatin 100000 UNIT/GM Powd  Apply topically daily as needed (to affected area).     pantoprazole 40 MG tablet  Commonly known as:  PROTONIX  Take 1 tablet (40 mg total) by mouth daily.  potassium chloride SA 20 MEQ tablet  Commonly known as:  K-DUR,KLOR-CON  Take 20 mEq by mouth daily.     pravastatin 20 MG tablet  Commonly known as:  PRAVACHOL  Take 20 mg by mouth daily.     ranitidine 150 MG tablet  Commonly known as:  ZANTAC  Take 150 mg by mouth 2 (two) times daily.     solifenacin 5 MG tablet  Commonly known as:  VESICARE  Take 10 mg by mouth daily.     triamcinolone cream 0.1 %  Commonly known as:  KENALOG  Apply 1 application topically 2 (two) times daily.       Allergies  Allergen Reactions  . Procardia [Nifedipine] Other (See Comments)    Constipation, gum irritation   Follow-up Information   Follow up with Estanislado Pandy, MD. Schedule an appointment as soon as possible for a visit in 1 week.   Specialty:  Cardiology   Contact information:   [address] [place] [address] [phone]       Follow up with EARLY, TODD, MD. Schedule an appointment as soon as possible for a visit in 4 weeks. (to remove sutures. )    Specialty:  Vascular Surgery   Contact information:   [address] [place] [address] [phone]        The results of significant diagnostics from this hospitalization (including imaging, microbiology, ancillary and laboratory) are listed below for reference.    Significant Diagnostic Studies: No results found.  Microbiology: Recent Results (from the past 240 hour(s))  SURGICAL PCR SCREEN     Status: None   Collection Time    04/03/13 11:56 PM      Result Value Range Status   MRSA, PCR NEGATIVE  NEGATIVE Final   Staphylococcus aureus NEGATIVE  NEGATIVE Final   Comment:            The Xpert SA Assay (FDA     approved for NASAL specimens     in patients over 27  years of age),     is one component of     a comprehensive surveillance     program.  Test performance has     been validated by [employer] for patients greater     than or equal to 87 year old.     It is not intended     to diagnose infection nor to     guide or monitor treatment.     Labs: Basic Metabolic Panel:  Recent Labs Lab 04/02/13 0534 04/03/13 0610 04/04/13 0418 04/04/13 1653 04/05/13 0607 04/06/13 0330  NA 137 137 137  --  136* 136*  K 4.1 4.0 4.0  --  4.1 4.1  CL 97 101 100  --  98 99  CO2 24 20 21   --  23 24  GLUCOSE 201* 279* 280*  --  246* 276*  BUN 19 20 18   --  16 14  CREATININE 1.28* 1.36* 1.37* 1.36* 1.30* 1.23*  CALCIUM 8.9 8.4 8.4  --  8.4 8.8   Liver Function Tests: No results found for this basename: AST, ALT, ALKPHOS, BILITOT, PROT, ALBUMIN,  in the last 168 hours No results found for this basename: LIPASE, AMYLASE,  in the last 168 hours No results found for this basename: AMMONIA,  in the last 168 hours CBC:  Recent Labs Lab 04/03/13 0610 04/04/13 0418 04/04/13 1653 04/05/13 0607 04/06/13 0330  WBC 16.0* 16.1* 15.6*  12.9* 13.6*  HGB 8.7* 8.0* 8.1* 7.7* 7.8*  HCT 26.7* 24.7* 24.7* 23.6* 23.9*  MCV 85.9 84.9 85.5 84.6 84.5  PLT 315 308 329 350 384   Cardiac Enzymes: No results found for this basename: CKTOTAL, CKMB, CKMBINDEX, TROPONINI,  in the last 168 hours BNP: BNP (last 3 results) No results found for this basename: PROBNP,  in the last 8760 hours CBG:  Recent Labs Lab 04/06/13 0636 04/06/13 1109 04/06/13 1639 04/06/13 2121 04/07/13 0636  GLUCAP 277* 288* 276* 198* 235*       Signed:  Jamare Vanatta  [facility] 04/07/2013, 11:23 AM

## 2013-04-07 NOTE — Progress Notes (Signed)
CSW (Clinical Child psychotherapistocial Worker) received consult for SNF. Pt has already been worked up for SNF and has bed available [facility] when medically stable.  Sumeet Geter, LCSWA [phone]

## 2013-04-08 ENCOUNTER — Telehealth: Payer: Self-pay | Admitting: Vascular Surgery

## 2013-04-08 NOTE — Telephone Encounter (Addendum)
Message copied by Fredrich BirksMILLIKAN, DANA P on Fri Apr 08, 2013  1:41 PM ------      Message from: Melene PlanKECK, JUDY J      Created: Thu Apr 07, 2013 11:10 AM       Dr Blake DivineAkula is D/C to a SNF today. She will need a 4 week F/U with Dr Ilean SkillFE for BKA 04/04/13 and staple removal.      Thanks      JJK ------  04/08/13: unable to reach pt, have mailed letter- no SNF information is found at this time, dpm

## 2013-05-09 ENCOUNTER — Encounter: Payer: Self-pay | Admitting: Vascular Surgery

## 2013-05-10 ENCOUNTER — Ambulatory Visit (INDEPENDENT_AMBULATORY_CARE_PROVIDER_SITE_OTHER): Payer: Medicare FFS | Admitting: Vascular Surgery

## 2013-05-10 ENCOUNTER — Encounter: Payer: Self-pay | Admitting: Vascular Surgery

## 2013-05-10 VITALS — BP 129/67 | HR 82 | Temp 99.0°F | Ht 71.0 in | Wt 256.0 lb

## 2013-05-10 DIAGNOSIS — L98499 Non-pressure chronic ulcer of skin of other sites with unspecified severity: Principal | ICD-10-CM

## 2013-05-10 DIAGNOSIS — I739 Peripheral vascular disease, unspecified: Secondary | ICD-10-CM

## 2013-05-10 NOTE — Progress Notes (Signed)
The patient presents today for followup of left below-knee amputation for gangrene of her left foot. Surgery was on 04/04/2013. She did well and was discharged to nursing facility. Today she reports a burning sensation in the upper stump but no specific pain.  Past Medical History  Diagnosis Date  . Diabetes mellitus without complication   . Chronic kidney disease   . GERD (gastroesophageal reflux disease)   . Stroke   . Hypertension   . Blindness   . Anemia   . DVT (deep venous thrombosis)     History  Substance Use Topics  . Smoking status: Never Smoker   . Smokeless tobacco: Never Used  . Alcohol Use: No    Family History  Problem Relation Age of Onset  . Diabetes Mother   . Heart disease Mother   . Hypertension Mother   . Peripheral vascular disease Mother     amputation  . Diabetes Father   . Hypertension Father   . Varicose Veins Father   . Peripheral vascular disease Father     amputation  . Cancer Sister   . Diabetes Sister   . Hypertension Sister   . Diabetes Brother   . Hypertension Brother   . Peripheral vascular disease Brother     amputation    Allergies  Allergen Reactions  . Procardia [Nifedipine] Other (See Comments)    Constipation, gum irritation    Current outpatient prescriptions:amLODipine (NORVASC) 5 MG tablet, Take 1 tablet (5 mg total) by mouth daily., Disp: , Rfl: ;  aspirin 81 MG tablet, Take 81 mg by mouth daily., Disp: , Rfl: ;  ciclopirox (PENLAC) 8 % solution, Apply 1 application topically at bedtime. Apply over nail and surrounding skin. Apply daily over previous coat. After seven (7) days, may remove with alcohol and continue cycle., Disp: , Rfl:  ciprofloxacin (CIPRO) 500 MG tablet, Take 1 tablet (500 mg total) by mouth 2 (two) times daily., Disp: 10 tablet, Rfl: 0;  docusate sodium 100 MG CAPS, Take 100 mg by mouth daily., Disp: 10 capsule, Rfl: 0;  feeding supplement, GLUCERNA SHAKE, (GLUCERNA SHAKE) LIQD, Take 237 mLs by mouth 3  (three) times daily between meals., Disp: , Rfl: 0 ferrous sulfate 325 (65 FE) MG tablet, Take 1 tablet (325 mg total) by mouth 2 (two) times daily with a meal., Disp: , Rfl: 3;  gabapentin (NEURONTIN) 300 MG capsule, Take 300 mg by mouth daily., Disp: , Rfl: ;  hydrALAZINE (APRESOLINE) 25 MG tablet, Take 1 tablet (25 mg total) by mouth 3 (three) times daily., Disp: , Rfl:  HYDROcodone-acetaminophen (NORCO) 10-325 MG per tablet, Take 1 tablet by mouth every 6 (six) hours as needed for moderate pain., Disp: 30 tablet, Rfl: 0;  insulin aspart (NOVOLOG) 100 UNIT/ML injection, CBG 70 - 120: 0 units CBG 121 - 150: 3 units CBG 151 - 200: 4 units CBG 201 - 250: 7 units CBG 251 - 300: 11 units CBG 301 - 350: 15 units CBG 351 - 400: 20 units, Disp: 10 mL, Rfl: 11 insulin aspart (NOVOLOG) 100 UNIT/ML injection, Inject 4 Units into the skin 3 (three) times daily with meals., Disp: 10 mL, Rfl: 11;  insulin glargine (LANTUS) 100 UNIT/ML injection, Inject 0.18 mLs (18 Units total) into the skin daily., Disp: 10 mL, Rfl: 11;  latanoprost (XALATAN) 0.005 % ophthalmic solution, Place 1 drop into both eyes at bedtime., Disp: , Rfl:  metoprolol succinate (TOPROL-XL) 50 MG 24 hr tablet, Take 50 mg by mouth daily.  Take with or immediately following a meal., Disp: , Rfl: ;  nystatin (MYCOSTATIN/NYSTOP) 100000 UNIT/GM POWD, Apply topically daily as needed (to affected area)., Disp: , Rfl: ;  pantoprazole (PROTONIX) 40 MG tablet, Take 1 tablet (40 mg total) by mouth daily., Disp: , Rfl:  potassium chloride SA (K-DUR,KLOR-CON) 20 MEQ tablet, Take 20 mEq by mouth daily., Disp: , Rfl: ;  pravastatin (PRAVACHOL) 20 MG tablet, Take 20 mg by mouth daily., Disp: , Rfl: ;  ranitidine (ZANTAC) 150 MG tablet, Take 150 mg by mouth 2 (two) times daily. , Disp: , Rfl: ;  solifenacin (VESICARE) 5 MG tablet, Take 10 mg by mouth daily., Disp: , Rfl:  triamcinolone cream (KENALOG) 0.1 %, Apply 1 application topically 2 (two) times daily., Disp: ,  Rfl:   BP 129/67  Pulse 82  Temp(Src) 99 F (37.2 C) (Oral)  Ht 5\' 11"  (1.803 m)  Wt 256 lb (116.121 kg)  BMI 35.72 kg/m2  SpO2 100%  Body mass index is 35.72 kg/(m^2).       On physical exam her left below-knee amputation is healed quite nicely with no evidence of skin breakdown. She is able to straighten her below-knee amputation stump with no evidence of contracture.  Impression and plan: Well-healed below-knee amputation. She will have her staples removed today. She will continue physical therapy at the nursing facility and see [place] again on an as-needed basis

## 2013-12-12 ENCOUNTER — Encounter: Payer: Self-pay | Admitting: Vascular Surgery

## 2013-12-12 ENCOUNTER — Other Ambulatory Visit: Payer: Self-pay

## 2013-12-12 DIAGNOSIS — L97909 Non-pressure chronic ulcer of unspecified part of unspecified lower leg with unspecified severity: Secondary | ICD-10-CM

## 2013-12-21 ENCOUNTER — Emergency Department (HOSPITAL_COMMUNITY)
Admission: EM | Admit: 2013-12-21 | Discharge: 2013-12-21 | Disposition: A | Discharge status: Home / Self Care | Payer: Medicaid Other | Attending: Emergency Medicine | Admitting: Emergency Medicine

## 2013-12-21 ENCOUNTER — Encounter (HOSPITAL_COMMUNITY): Payer: Self-pay | Admitting: Emergency Medicine

## 2013-12-21 DIAGNOSIS — Z7982 Long term (current) use of aspirin: Secondary | ICD-10-CM | POA: Diagnosis not present

## 2013-12-21 DIAGNOSIS — Z8673 Personal history of transient ischemic attack (TIA), and cerebral infarction without residual deficits: Secondary | ICD-10-CM | POA: Diagnosis not present

## 2013-12-21 DIAGNOSIS — E119 Type 2 diabetes mellitus without complications: Secondary | ICD-10-CM | POA: Diagnosis not present

## 2013-12-21 DIAGNOSIS — Z86718 Personal history of other venous thrombosis and embolism: Secondary | ICD-10-CM | POA: Insufficient documentation

## 2013-12-21 DIAGNOSIS — Z79899 Other long term (current) drug therapy: Secondary | ICD-10-CM | POA: Insufficient documentation

## 2013-12-21 DIAGNOSIS — L97509 Non-pressure chronic ulcer of other part of unspecified foot with unspecified severity: Secondary | ICD-10-CM

## 2013-12-21 DIAGNOSIS — Y835 Amputation of limb(s) as the cause of abnormal reaction of the patient, or of later complication, without mention of misadventure at the time of the procedure: Secondary | ICD-10-CM | POA: Diagnosis not present

## 2013-12-21 DIAGNOSIS — Z794 Long term (current) use of insulin: Secondary | ICD-10-CM | POA: Diagnosis not present

## 2013-12-21 DIAGNOSIS — Z8719 Personal history of other diseases of the digestive system: Secondary | ICD-10-CM | POA: Insufficient documentation

## 2013-12-21 DIAGNOSIS — I10 Essential (primary) hypertension: Secondary | ICD-10-CM | POA: Diagnosis not present

## 2013-12-21 DIAGNOSIS — D649 Anemia, unspecified: Secondary | ICD-10-CM | POA: Insufficient documentation

## 2013-12-21 DIAGNOSIS — T8140XA Infection following a procedure, unspecified, initial encounter: Secondary | ICD-10-CM | POA: Insufficient documentation

## 2013-12-21 DIAGNOSIS — H543 Unqualified visual loss, both eyes: Secondary | ICD-10-CM | POA: Insufficient documentation

## 2013-12-21 DIAGNOSIS — Z792 Long term (current) use of antibiotics: Secondary | ICD-10-CM | POA: Diagnosis not present

## 2013-12-21 DIAGNOSIS — T148XXD Other injury of unspecified body region, subsequent encounter: Secondary | ICD-10-CM

## 2013-12-21 LAB — BASIC METABOLIC PANEL
Anion gap: 9 (ref 5–15)
BUN: 25 mg/dL — ABNORMAL HIGH (ref 6–23)
CHLORIDE: 103 meq/L (ref 96–112)
CO2: 26 meq/L (ref 19–32)
CREATININE: 1.85 mg/dL — AB (ref 0.50–1.10)
Calcium: 9.8 mg/dL (ref 8.4–10.5)
GFR calc Af Amer: 34 mL/min — ABNORMAL LOW (ref >90)
GFR calc non Af Amer: 29 mL/min — ABNORMAL LOW (ref >90)
Glucose, Bld: 136 mg/dL — ABNORMAL HIGH (ref 70–99)
Potassium: 6.4 mEq/L — ABNORMAL HIGH (ref 3.7–5.3)
Sodium: 138 mEq/L (ref 137–147)

## 2013-12-21 LAB — DIC (DISSEMINATED INTRAVASCULAR COAGULATION)PANEL
D-Dimer, Quant: 1.56 ug/mL-FEU — ABNORMAL HIGH (ref 0.00–0.48)
INR: 1.14 (ref 0.00–1.49)
Platelets: 265 10*3/uL (ref 150–400)

## 2013-12-21 LAB — CBC WITH DIFFERENTIAL/PLATELET
Basophils Absolute: 0 10*3/uL (ref 0.0–0.1)
Basophils Relative: 0 % (ref 0–1)
Eosinophils Absolute: 0.7 10*3/uL (ref 0.0–0.7)
Eosinophils Relative: 9 % — ABNORMAL HIGH (ref 0–5)
HEMATOCRIT: 35.3 % — AB (ref 36.0–46.0)
Hemoglobin: 11.4 g/dL — ABNORMAL LOW (ref 12.0–15.0)
LYMPHS PCT: 21 % (ref 12–46)
Lymphs Abs: 1.6 10*3/uL (ref 0.7–4.0)
MCH: 28.6 pg (ref 26.0–34.0)
MCHC: 32.3 g/dL (ref 30.0–36.0)
MCV: 88.7 fL (ref 78.0–100.0)
MONO ABS: 0.6 10*3/uL (ref 0.1–1.0)
Monocytes Relative: 8 % (ref 3–12)
NEUTROS ABS: 4.8 10*3/uL (ref 1.7–7.7)
Neutrophils Relative %: 62 % (ref 43–77)
Platelets: 274 10*3/uL (ref 150–400)
RBC: 3.98 MIL/uL (ref 3.87–5.11)
RDW: 13.5 % (ref 11.5–15.5)
WBC: 7.8 10*3/uL (ref 4.0–10.5)

## 2013-12-21 LAB — DIC (DISSEMINATED INTRAVASCULAR COAGULATION) PANEL
APTT: 29 s (ref 24–37)
FIBRINOGEN: 501 mg/dL — AB (ref 204–475)
PROTHROMBIN TIME: 14.6 s (ref 11.6–15.2)
SMEAR REVIEW: NONE SEEN

## 2013-12-21 NOTE — ED Notes (Signed)
Pt sent here due to poor vascular to right foot. Had right little toe removed on 9/8 and having poor healing. Foot wrapped and in ortho shoe pta, able to move digits. Sent here to see vascular surgeon. No acute distress noted at triage.

## 2013-12-21 NOTE — ED Notes (Signed)
Pt comfortable with discharge and follow up instructions. No prescriptions. 

## 2013-12-21 NOTE — ED Notes (Signed)
Report given to [employer] in [place]

## 2013-12-21 NOTE — ED Provider Notes (Signed)
CSN: [id number]     Arrival date & time 12/21/13  1033 History   First MD Initiated Contact with Patient 12/21/13 1400     Chief Complaint  Patient presents with  . Wound Infection     (Consider location/radiation/quality/duration/timing/severity/associated sxs/prior Treatment) HPI Ms. Cooley is a 58 year old female with past medical history of diabetes, CKD, hypertension, recent amputation to right fifth digit on 12/07/2013 by Dr. Lynden Ang at [facility] who presents to the ER after being sent by Dr. Lynden Ang from his clinic. Patient states she's seen today, for followup on her wound care, when Dr.Cathy noted increased devitalized and necrotic tissue at the operative site. Dr. Lynden Ang recommended patient be seen at the ER at [facility] to followup with Dr. Arbie Cookey with vascular surgery. Patient reports very mild pain at the operative site, and states she has neuropathy and does not have much feeling in her foot. Patient denies fever, nausea, vomiting, abdominal pain, chest pain, shortness of breath, dizziness, weakness.   Past Medical History  Diagnosis Date  . Diabetes mellitus without complication   . Chronic kidney disease   . GERD (gastroesophageal reflux disease)   . Stroke   . Hypertension   . Blindness   . Anemia   . DVT (deep venous thrombosis)    Past Surgical History  Procedure Laterality Date  . Appendectomy    . Lipoma excision      from L flank  . Amputation N/A 04/04/2013    Procedure: AMPUTATION BELOW KNEE;  Surgeon: Larina Earthly, MD;  Location: [facility] OR;  Service: Vascular;  Laterality: N/A;   Family History  Problem Relation Age of Onset  . Diabetes Mother   . Heart disease Mother   . Hypertension Mother   . Peripheral vascular disease Mother     amputation  . Diabetes Father   . Hypertension Father   . Varicose Veins Father   . Peripheral vascular disease Father     amputation  . Cancer Sister   . Diabetes Sister   . Hypertension Sister   . Diabetes  Brother   . Hypertension Brother   . Peripheral vascular disease Brother     amputation   History  Substance Use Topics  . Smoking status: Never Smoker   . Smokeless tobacco: Never Used  . Alcohol Use: No   OB History   Grav Para Term Preterm Abortions TAB SAB Ect Mult Living                 Review of Systems  Constitutional: Negative for fever.  HENT: Negative for trouble swallowing.   Eyes: Negative for visual disturbance.  Respiratory: Negative for shortness of breath.   Cardiovascular: Negative for chest pain.  Gastrointestinal: Negative for nausea, vomiting and abdominal pain.  Genitourinary: Negative for dysuria.  Musculoskeletal: Negative for neck pain.  Skin: Positive for wound. Negative for rash.  Neurological: Negative for dizziness, weakness and numbness.  Psychiatric/Behavioral: Negative.       Allergies  Procardia  Home Medications   Prior to Admission medications   Medication Sig Start Date End Date Taking? Authorizing Provider  amLODipine (NORVASC) 5 MG tablet Take 1 tablet (5 mg total) by mouth daily. 04/07/13  Yes Kathlen Mody, MD  aspirin 81 MG tablet Take 81 mg by mouth daily.   Yes Historical Provider, MD  ferrous sulfate 325 (65 FE) MG tablet Take 325 mg by mouth daily with breakfast.   Yes Historical Provider, MD  gabapentin (NEURONTIN) 300  MG capsule Take 300 mg by mouth daily.   Yes Historical Provider, MD  glimepiride (AMARYL) 4 MG tablet Take 4 mg by mouth daily with breakfast.   Yes Historical Provider, MD  Glucose Blood (ACCU-CHEK AVIVA PLUS VI) 1 each by Other route See admin instructions. Check blood sugar twice daily.   Yes Historical Provider, MD  HYDROcodone-acetaminophen (NORCO/VICODIN) 5-325 MG per tablet Take 1 tablet by mouth every 4 (four) hours as needed for moderate pain.   Yes Historical Provider, MD  insulin glargine (LANTUS) 100 UNIT/ML injection Inject 50 Units into the skin at bedtime.   Yes Historical Provider, MD  insulin  lispro (HUMALOG) 100 UNIT/ML injection Inject 0-15 Units into the skin 2 (two) times daily. Per sliding scale 0-200 = 0 units, 200-300 = 10 units, 300-500 = 15 units, >500 units = call MD.   Yes Historical Provider, MD  latanoprost (XALATAN) 0.005 % ophthalmic solution Place 1 drop into both eyes at bedtime.   Yes Historical Provider, MD  losartan-hydrochlorothiazide (HYZAAR) 100-12.5 MG per tablet Take 1 tablet by mouth daily.   Yes Historical Provider, MD  metoprolol succinate (TOPROL-XL) 50 MG 24 hr tablet Take 50 mg by mouth daily. Take with or immediately following a meal.   Yes Historical Provider, MD  potassium chloride SA (K-DUR,KLOR-CON) 20 MEQ tablet Take 20 mEq by mouth daily.   Yes Historical Provider, MD  pravastatin (PRAVACHOL) 20 MG tablet Take 20 mg by mouth daily.   Yes Historical Provider, MD  ranitidine (ZANTAC) 150 MG tablet Take 150 mg by mouth 2 (two) times daily.    Yes Historical Provider, MD  solifenacin (VESICARE) 5 MG tablet Take 5 mg by mouth daily.    Yes Historical Provider, MD   BP 121/62  Pulse 64  Temp(Src) 98 F (36.7 C) (Oral)  Resp 16  SpO2 96% Physical Exam  Nursing note and vitals reviewed. Constitutional: She is oriented to person, place, and time. She appears well-developed and well-nourished. No distress.  HENT:  Head: Normocephalic and atraumatic.  Mouth/Throat: Oropharynx is clear and moist. No oropharyngeal exudate.  Eyes: Right eye exhibits no discharge. Left eye exhibits no discharge. No scleral icterus.  Neck: Normal range of motion.  Cardiovascular: Normal rate, regular rhythm and normal heart sounds.   No murmur heard. Pulmonary/Chest: Effort normal and breath sounds normal. No respiratory distress.  Abdominal: Soft. There is no tenderness.  Musculoskeletal: Normal range of motion. She exhibits no edema and no tenderness.  BKA noted to left leg. Amputation of right fifth digit noted with mild debridement into distal metatarsals. Poorly  healing the wound site with a small amount of red tissue, and surrounding black tissue. Scant amount of purulence noted. Normal range of motion of ankle. DP pulse 1+.   Neurological: She is alert and oriented to person, place, and time. No cranial nerve deficit. Coordination normal.  Light touch and dull touch of patient's right foot patient states her feeling is diminished. Patient states this is at baseline for her.  Skin: Skin is warm and dry. No rash noted. She is not diaphoretic.  Psychiatric: She has a normal mood and affect.    ED Course  Procedures (including critical care time) Labs Review Labs Reviewed  CBC WITH DIFFERENTIAL - Abnormal; Notable for the following:    Hemoglobin 11.4 (*)    HCT 35.3 (*)    Eosinophils Relative 9 (*)    All other components within normal limits  BASIC METABOLIC PANEL - Abnormal;  Notable for the following:    Potassium 6.4 (*)    Glucose, Bld 136 (*)    BUN 25 (*)    Creatinine, Ser 1.85 (*)    GFR calc non Af Amer 29 (*)    GFR calc Af Amer 34 (*)    All other components within normal limits  DIC (DISSEMINATED INTRAVASCULAR COAGULATION) PANEL - Abnormal; Notable for the following:    Fibrinogen 501 (*)    D-Dimer, Quant 1.56 (*)    All other components within normal limits    Imaging Review No results found.   EKG Interpretation None      MDM   Final diagnoses:  Wound healing, delayed    58 year old female diabetes, CK D. attending after being seen in clinic with Dr. Lynden Ang for followup of wound care status post amputation of right fifth digit. Dr. Lynden Ang noting devitalized necrotic tissue at the operative site, and requesting patient be seen and evaluated in the ED, along with consulted by vascular surgery. Patient well-appearing, nontoxic, nontachypneic, nontender cardiac, afebrile, in no acute distress.  Dr. Hart Rochester from vascular surgery saw patient. Per his recommendations patient will need to continue to follow up with Dr.  Marcha Solders for wound care, and further debridement or BKA. Patient has good pedal pulse at this time, and per Dr. Hart Rochester does not require revascularization. We will discharge patient and have her followup with Dr. Lynden Ang. We encouraged patient to call or return to the ER should she develop any fever, worsening pain in her foot, worsening necrosis, purulent discharge or foul odor. We also encouraged patient to call or return to ER should she have a questions or concerns.   BP 121/62  Pulse 64  Temp(Src) 98 F (36.7 C) (Oral)  Resp 16  SpO2 96%   Signed,  Ladona Mow, PA-C 5:18 PM   This patient seen and discussed with Dr. Linwood Dibbles, MD    Monte Fantasia, PA-C 12/21/13 1719

## 2013-12-21 NOTE — Discharge Instructions (Signed)
Wound Care       Wound care helps prevent pain and infection.   You may need a tetanus shot if:   You cannot remember when you had your last tetanus shot.   You have never had a tetanus shot.   The injury broke your skin.  If you need a tetanus shot and you choose not to have one, you may get tetanus. Sickness from tetanus can be serious.   HOME CARE   Only take medicine as told by your doctor.   Clean the wound daily with mild soap and water.   Change any bandages (dressings) as told by your doctor.   Put medicated cream and a bandage on the wound as told by your doctor.   Change the bandage if it gets wet, dirty, or starts to smell.   Take showers. Do not take baths, swim, or do anything that puts your wound under water.   Rest and raise (elevate) the wound until the pain and puffiness (swelling) are better.   Keep all doctor visits as told.  GET HELP RIGHT AWAY IF:   Yellowish-white fluid (pus) comes from the wound.   Medicine does not lessen your pain.   There is a red streak going away from the wound.   You have a fever.  MAKE SURE YOU:   Understand these instructions.   Will watch your condition.   Will get help right away if you are not doing well or get worse.  Document Released: 12/25/2007 Document Revised: 06/09/2011 Document Reviewed: 07/21/2010   ExitCare Patient Information 2015 [employer]. This information is not intended to replace advice given to you by your health care provider. Make sure you discuss any questions you have with your health care provider.   Dressing Change   A dressing is a material placed over wounds. It keeps the wound clean, dry, and protected from further injury. This provides an environment that favors wound healing.   BEFORE YOU BEGIN   Get your supplies together. Things you may need include:   Saline solution.   Flexible gauze dressing.   Medicated cream.   Tape.   Gloves.   Abdominal dressing pads.   Gauze squares.   Plastic bags.  Take pain medicine 30 minutes before  the dressing change if you need it.   Take a shower before you do the first dressing change of the day. Use plastic wrap or a plastic bag to prevent the dressing from getting wet.  REMOVING YOUR OLD DRESSING   Wash your hands with soap and water. Dry your hands with a clean towel.   Put on your gloves.   Remove any tape.   Carefully remove the old dressing. If the dressing sticks, you may dampen it with warm water to loosen it, or follow your caregiver's specific directions.   Remove any gauze or packing tape that is in your wound.   Take off your gloves.   Put the gloves, tape, gauze, or any packing tape into a plastic bag.  CHANGING YOUR DRESSING   Open the supplies.   Take the cap off the saline solution.   Open the gauze package so that the gauze remains on the inside of the package.   Put on your gloves.   Clean your wound as told by your caregiver.   If you have been told to keep your wound dry, follow those instructions.   Your caregiver may tell you to do one or more of   the following:   Pick up the gauze. Pour the saline solution over the gauze. Squeeze out the extra saline solution.   Put medicated cream or other medicine on your wound if you have been told to do so.   Put the solution soaked gauze only in your wound, not on the skin around it.   Pack your wound loosely or as told by your caregiver.   Put dry gauze on your wound.   Put abdominal dressing pads over the dry gauze if your wet gauze soaks through.  Tape the abdominal dressing pads in place so they will not fall off. Do not wrap the tape completely around the affected part (arm, leg, abdomen).   Wrap the dressing pads with a flexible gauze dressing to secure it in place.   Take off your gloves. Put them in the plastic bag with the old dressing. Tie the bag shut and throw it away.   Keep the dressing clean and dry until your next dressing change.   Wash your hands.  SEEK MEDICAL CARE IF:   Your skin around the wound looks red.   Your wound feels  more tender or sore.   You see pus in the wound.   Your wound smells bad.   You have a fever.   Your skin around the wound has a rash that itches and burns.   You see black or yellow skin in your wound that was not there before.   You feel nauseous, throw up, and feel very tired.  Document Released: 04/24/2004 Document Revised: 06/09/2011 Document Reviewed: 01/27/2011   ExitCare Patient Information 2015 [employer]. This information is not intended to replace advice given to you by your health care provider. Make sure you discuss any questions you have with your health care provider.

## 2013-12-21 NOTE — ED Provider Notes (Signed)
Pt was sent to the ED to see a vascular surgeon.  She had a 5th toe (right) amputation performed on 9/8.  The wound has not been healing well.  She saw her surgeon, Dr Tamala Bari at another medical facility and was told that she needed to come to [facility] ED and see a vascular surgeon.  Her wound appears more necrotic.  She has poor wound healing.  Pt denies fever or other acute complaints. Physical Exam  BP 147/69  Pulse 66  Temp(Src) 98.2 F (36.8 C) (Oral)  Resp 27  SpO2 100%  Physical Exam  Nursing note and vitals reviewed. Constitutional: She appears well-developed and well-nourished. No distress.  HENT:  Head: Normocephalic and atraumatic.  Right Ear: External ear normal.  Left Ear: External ear normal.  Eyes: Conjunctivae are normal. Right eye exhibits no discharge. Left eye exhibits no discharge. No scleral icterus.  Neck: Neck supple. No tracheal deviation present.  Cardiovascular: Normal rate.   Pulmonary/Chest: Effort normal. No stridor. No respiratory distress.  Musculoskeletal: She exhibits no edema.  S/p left BKA, right foot with nectoric tissues around her surgical wound from the 5th mt.  No purulent drainage, no erythema  Neurological: She is alert. Cranial nerve deficit: no gross deficits.  Skin: Skin is warm and dry. No rash noted.  Psychiatric: She has a normal mood and affect.    ED Course  Procedures  MDM Poor  wound healing with  devitalized tissue.   May end up requiring more extensive amputation.  Will consult vascular surgery.     Linwood Dibbles, MD 12/21/13 [phone]

## 2013-12-21 NOTE — Consult Note (Signed)
Hospital Consult    Reason for Consult:  Toe amputation wound right foot Referring Physician:  ER MRN #:  [id number]  History of Present Illness: This is a 58 y.o. female who states she had gone in to [facility] hospital 2 weeks ago to have her right 5th toe debrided by Dr. Marcha Solders, an orthopod.  During surgery, she states it was decided it was that her wound was worse than originally thought and he did a right 5th toe amputation.  She presented today for her postoperative visit and was told she needed to present to the ER at [facility] hospital to f/u with Dr. Arbie Cookey for her wound.  She states that she has an appointment with Dr. Arbie Cookey next week.  She has a hx of a left BKA by Dr. Arbie Cookey on 04/04/13.  She had been scheduled for an angiogram on 03/29/14, but this was ultimately cancelled due to severe right groin/fungal/yeast infection.  She does have a hx of stroke in 2003, which has affected her vision.  She does have a hx of CKD with a creatinine today of 1.85.  She does have a hx of diabetes, which she states is not well controlled.  She is on insulin.  She is on a statin for her cholesterol.    She resides in the [facility]  Past Medical History  Diagnosis Date  . Diabetes mellitus without complication   . Chronic kidney disease   . GERD (gastroesophageal reflux disease)   . Stroke   . Hypertension   . Blindness   . Anemia   . DVT (deep venous thrombosis)    Past Surgical History  Procedure Laterality Date  . Appendectomy    . Lipoma excision      from L flank  . Amputation N/A 04/04/2013    Procedure: AMPUTATION BELOW KNEE;  Surgeon: Larina Earthly, MD;  Location: [facility] OR;  Service: Vascular;  Laterality: N/A;    Allergies  Allergen Reactions  . Procardia [Nifedipine] Other (See Comments)    Constipation, gum irritation    Prior to Admission medications   Medication Sig Start Date End Date Taking? Authorizing Provider  amLODipine (NORVASC) 5 MG tablet Take 1 tablet (5 mg total) by  mouth daily. 04/07/13  Yes Kathlen Mody, MD  aspirin 81 MG tablet Take 81 mg by mouth daily.   Yes Historical Provider, MD  ferrous sulfate 325 (65 FE) MG tablet Take 325 mg by mouth daily with breakfast.   Yes Historical Provider, MD  gabapentin (NEURONTIN) 300 MG capsule Take 300 mg by mouth daily.   Yes Historical Provider, MD  glimepiride (AMARYL) 4 MG tablet Take 4 mg by mouth daily with breakfast.   Yes Historical Provider, MD  Glucose Blood (ACCU-CHEK AVIVA PLUS VI) 1 each by Other route See admin instructions. Check blood sugar twice daily.   Yes Historical Provider, MD  HYDROcodone-acetaminophen (NORCO/VICODIN) 5-325 MG per tablet Take 1 tablet by mouth every 4 (four) hours as needed for moderate pain.   Yes Historical Provider, MD  insulin glargine (LANTUS) 100 UNIT/ML injection Inject 50 Units into the skin at bedtime.   Yes Historical Provider, MD  insulin lispro (HUMALOG) 100 UNIT/ML injection Inject 0-15 Units into the skin 2 (two) times daily. Per sliding scale 0-200 = 0 units, 200-300 = 10 units, 300-500 = 15 units, >500 units = call MD.   Yes Historical Provider, MD  latanoprost (XALATAN) 0.005 % ophthalmic solution Place 1 drop into both eyes at bedtime.  Yes Historical Provider, MD  losartan-hydrochlorothiazide (HYZAAR) 100-12.5 MG per tablet Take 1 tablet by mouth daily.   Yes Historical Provider, MD  metoprolol succinate (TOPROL-XL) 50 MG 24 hr tablet Take 50 mg by mouth daily. Take with or immediately following a meal.   Yes Historical Provider, MD  potassium chloride SA (K-DUR,KLOR-CON) 20 MEQ tablet Take 20 mEq by mouth daily.   Yes Historical Provider, MD  pravastatin (PRAVACHOL) 20 MG tablet Take 20 mg by mouth daily.   Yes Historical Provider, MD  ranitidine (ZANTAC) 150 MG tablet Take 150 mg by mouth 2 (two) times daily.    Yes Historical Provider, MD  solifenacin (VESICARE) 5 MG tablet Take 5 mg by mouth daily.    Yes Historical Provider, MD    History   Social History   . Marital Status: Divorced    Spouse Name: N/A    Number of Children: N/A  . Years of Education: N/A   Occupational History  . Not on file.   Social History Main Topics  . Smoking status: Never Smoker   . Smokeless tobacco: Never Used  . Alcohol Use: No  . Drug Use: No  . Sexual Activity: Not on file   Other Topics Concern  . Not on file   Social History Narrative  . No narrative on file     Family History  Problem Relation Age of Onset  . Diabetes Mother   . Heart disease Mother   . Hypertension Mother   . Peripheral vascular disease Mother     amputation  . Diabetes Father   . Hypertension Father   . Varicose Veins Father   . Peripheral vascular disease Father     amputation  . Cancer Sister   . Diabetes Sister   . Hypertension Sister   . Diabetes Brother   . Hypertension Brother   . Peripheral vascular disease Brother     amputation    ROS:  Positive    Negative    All sytems reviewed and are negative  Cardiovascular:  chest pain/pressure  palpitations  SOB lying flat  DOE  pain in legs while walking  pain in legs at rest  pain in legs at night  non-healing ulcers  hx of DVT  swelling in legs  Pulmonary:  productive cough  asthma/wheezing  home O2  Neurologic:  weakness in  arms  legs  numbness in  arms  legs  hx of CVA with vision changes  mini stroke difficulty speaking or slurred speech  temporary loss of vision in one eye  dizziness  Hematologic:  hx of cancer  bleeding problems  problems with blood clotting easily  Endocrine:    diabetes  thyroid disease  GI  vomiting blood  blood in stool  GERD  GU:  CKD/renal failure  HD--[]  M/W/F or  T/T/S  burning with urination  blood in urine  Psychiatric:  anxiety  depression  Musculoskeletal:  arthritis  joint pain  Integumentary:  rashes  ulcers  Constitutional:   fever  chills   Physical Examination  Filed Vitals:   12/21/13 1604  BP: 121/62  Pulse: 64  Temp: 98 F (36.7 C)  Resp: 16   There is no weight on file to calculate BMI.  General:  WDWN in NAD Gait: Not observed HENT: WNL, normocephalic Eyes: Pupils equal Pulmonary: normal non-labored breathing, without Rales, rhonchi,  wheezing Cardiac: regular, without  Murmurs, rubs or gallops; Abdomen: soft, NT/ND, no masses Skin:  without rashes, without ulcers  Vascular Exam/Pulses:  Right Left  Radial 1+ (weak) 2+ (normal)  Ulnar Unable to palpate  Unable to palpate   Femoral 2+ (normal) 2+ (normal)  Popliteal 1+ (weak) Not palpated  DP 1+ (weak); brisk doppler flow BKA  Peroneal Brisk doppler flow BKA  PT Unable to palpate BKA   Extremities: with ischemic changes,with open wounds;  Musculoskeletal: no muscle wasting or atrophy  Neurologic: A&O X 3; Appropriate Affect ; SENSATION: normal; MOTOR FUNCTION:  moving all extremities equally. Speech is fluent/normal Psychiatric:  Capable of making decisions  CBC    Component Value Date/Time   WBC 7.8 12/21/2013 1610   RBC 3.98 12/21/2013 1610   HGB 11.4* 12/21/2013 1610   HCT 35.3* 12/21/2013 1610   PLT 274 12/21/2013 1610   PLT 265 12/21/2013 1610   MCV 88.7 12/21/2013 1610   MCH 28.6 12/21/2013 1610   MCHC 32.3 12/21/2013 1610   RDW 13.5 12/21/2013 1610   LYMPHSABS 1.6 12/21/2013 1610   MONOABS 0.6 12/21/2013 1610   EOSABS 0.7 12/21/2013 1610   BASOSABS 0.0 12/21/2013 1610   BMET    Component Value Date/Time   NA 138 12/21/2013 1610   K 6.4* 12/21/2013 1610   CL 103 12/21/2013 1610   CO2 26 12/21/2013 1610   GLUCOSE 136* 12/21/2013 1610   BUN 25* 12/21/2013 1610   CREATININE 1.85* 12/21/2013 1610   CALCIUM 9.8 12/21/2013 1610   GFRNONAA 29* 12/21/2013 1610   GFRAA 34* 12/21/2013 1610     COAGS: Lab Results  Component Value Date   INR PENDING 12/21/2013   INR 1.32 04/03/2013     Non-Invasive Vascular Imaging:  none  Statin:   Yes.   Beta Blocker:  Yes.   Aspirin:  Yes.   ACEI:  No. ARB:  Yes.   Other antiplatelets/anticoagulants:  No.   ASSESSMENT/PLAN: This is a 58 y.o. female who is s/p right 5th toe amputation by Dr. Marcha Solders (orthopod) in [place] who was told to report to [facility] ED for non healing amputation site.   -amputation site is not infected at this time.  There is poor granulation tissue.  Continue wet to dry dressing changes at this time.   -will discharge on ABx and have her follow up with Dr. Marcha Solders -she does not need amputation at this time.  If she needs amputation in the future, will be glad to perform this if needed.  If so, she can follow up in Dr. Bosie Helper clinic.   Doreatha Massed, PA-C [facility] [phone]  Agree with above assessment The right fifth toe amputation site is dry with avascular tissue and will not heal Patient has palpable dorsalis pedis pulse of 2-3+ and good Doppler flow in dorsalis pedis and peroneal arteries No role for revascularization in this diabetic patient  Patient will likely need right BKA in the near future We'll have patient followup with Dr. Marcha Solders regarding further plans.

## 2013-12-22 ENCOUNTER — Telehealth: Payer: Self-pay

## 2013-12-22 NOTE — Telephone Encounter (Signed)
Phone call from [place], where pt. resides;  Questioned if the pt. needs to keep her appt. on 12/27/13 with Dr. Arbie Cookey, since she was sent to [facility] yesterday, and evaluated by Vascular Surgery?  Emergency notes reviewed by Dr. Arbie Cookey.  Stated pt. does not need to keep appt. on 9/29, and should f/u with Dr. Marcha Solders, in [place], to determine timing of amputation.  [facility] will be available, if pt. requests that surgery be performed by vascular surgery group.  Phone call to Dr. Gabriel Rung office; spoke with nurse, Misty Stanley.  Will fax Emergency notes to their office for continuity of care.

## 2013-12-27 ENCOUNTER — Ambulatory Visit: Payer: Medicare FFS | Admitting: Vascular Surgery

## 2013-12-27 ENCOUNTER — Other Ambulatory Visit (HOSPITAL_COMMUNITY): Payer: Medicare FFS

## 2014-03-09 ENCOUNTER — Encounter (HOSPITAL_COMMUNITY): Payer: Self-pay | Admitting: Surgery

## 2014-03-31 DIAGNOSIS — Z8673 Personal history of transient ischemic attack (TIA), and cerebral infarction without residual deficits: Secondary | ICD-10-CM | POA: Diagnosis not present

## 2014-03-31 DIAGNOSIS — N189 Chronic kidney disease, unspecified: Secondary | ICD-10-CM | POA: Diagnosis present

## 2014-03-31 DIAGNOSIS — E119 Type 2 diabetes mellitus without complications: Secondary | ICD-10-CM | POA: Diagnosis not present

## 2014-03-31 DIAGNOSIS — E11359 Type 2 diabetes mellitus with proliferative diabetic retinopathy without macular edema: Secondary | ICD-10-CM | POA: Diagnosis not present

## 2014-03-31 DIAGNOSIS — T8131XA Disruption of external operation (surgical) wound, not elsewhere classified, initial encounter: Secondary | ICD-10-CM | POA: Diagnosis not present

## 2014-03-31 DIAGNOSIS — F330 Major depressive disorder, recurrent, mild: Secondary | ICD-10-CM | POA: Diagnosis not present

## 2014-03-31 DIAGNOSIS — Z6835 Body mass index (BMI) 35.0-35.9, adult: Secondary | ICD-10-CM | POA: Diagnosis not present

## 2014-03-31 DIAGNOSIS — N183 Chronic kidney disease, stage 3 (moderate): Secondary | ICD-10-CM | POA: Diagnosis not present

## 2014-03-31 DIAGNOSIS — K219 Gastro-esophageal reflux disease without esophagitis: Secondary | ICD-10-CM | POA: Diagnosis present

## 2014-03-31 DIAGNOSIS — Z89511 Acquired absence of right leg below knee: Secondary | ICD-10-CM | POA: Diagnosis not present

## 2014-03-31 DIAGNOSIS — E1142 Type 2 diabetes mellitus with diabetic polyneuropathy: Secondary | ICD-10-CM | POA: Diagnosis present

## 2014-03-31 DIAGNOSIS — E1165 Type 2 diabetes mellitus with hyperglycemia: Secondary | ICD-10-CM | POA: Diagnosis not present

## 2014-03-31 DIAGNOSIS — I739 Peripheral vascular disease, unspecified: Secondary | ICD-10-CM | POA: Diagnosis present

## 2014-03-31 DIAGNOSIS — I2510 Atherosclerotic heart disease of native coronary artery without angina pectoris: Secondary | ICD-10-CM | POA: Diagnosis not present

## 2014-03-31 DIAGNOSIS — H43813 Vitreous degeneration, bilateral: Secondary | ICD-10-CM | POA: Diagnosis not present

## 2014-03-31 DIAGNOSIS — T8743 Infection of amputation stump, right lower extremity: Secondary | ICD-10-CM | POA: Diagnosis present

## 2014-03-31 DIAGNOSIS — Z89512 Acquired absence of left leg below knee: Secondary | ICD-10-CM | POA: Diagnosis not present

## 2014-03-31 DIAGNOSIS — T8781 Dehiscence of amputation stump: Secondary | ICD-10-CM | POA: Diagnosis present

## 2014-03-31 DIAGNOSIS — E668 Other obesity: Secondary | ICD-10-CM | POA: Diagnosis present

## 2014-03-31 DIAGNOSIS — H2513 Age-related nuclear cataract, bilateral: Secondary | ICD-10-CM | POA: Diagnosis not present

## 2014-03-31 DIAGNOSIS — I10 Essential (primary) hypertension: Secondary | ICD-10-CM | POA: Diagnosis not present

## 2014-03-31 DIAGNOSIS — F3341 Major depressive disorder, recurrent, in partial remission: Secondary | ICD-10-CM | POA: Diagnosis present

## 2014-03-31 DIAGNOSIS — D649 Anemia, unspecified: Secondary | ICD-10-CM | POA: Diagnosis not present

## 2014-03-31 DIAGNOSIS — Z794 Long term (current) use of insulin: Secondary | ICD-10-CM | POA: Diagnosis not present

## 2014-03-31 DIAGNOSIS — I129 Hypertensive chronic kidney disease with stage 1 through stage 4 chronic kidney disease, or unspecified chronic kidney disease: Secondary | ICD-10-CM | POA: Diagnosis present

## 2014-03-31 DIAGNOSIS — Z888 Allergy status to other drugs, medicaments and biological substances status: Secondary | ICD-10-CM | POA: Diagnosis not present

## 2014-03-31 DIAGNOSIS — Z79899 Other long term (current) drug therapy: Secondary | ICD-10-CM | POA: Diagnosis not present

## 2014-04-12 DIAGNOSIS — E1165 Type 2 diabetes mellitus with hyperglycemia: Secondary | ICD-10-CM | POA: Diagnosis not present

## 2014-04-17 DIAGNOSIS — H43813 Vitreous degeneration, bilateral: Secondary | ICD-10-CM | POA: Diagnosis not present

## 2014-04-17 DIAGNOSIS — H2513 Age-related nuclear cataract, bilateral: Secondary | ICD-10-CM | POA: Diagnosis not present

## 2014-04-17 DIAGNOSIS — E11359 Type 2 diabetes mellitus with proliferative diabetic retinopathy without macular edema: Secondary | ICD-10-CM | POA: Diagnosis not present

## 2014-04-19 DIAGNOSIS — Z6835 Body mass index (BMI) 35.0-35.9, adult: Secondary | ICD-10-CM | POA: Diagnosis not present

## 2014-04-19 DIAGNOSIS — E1142 Type 2 diabetes mellitus with diabetic polyneuropathy: Secondary | ICD-10-CM | POA: Diagnosis present

## 2014-04-19 DIAGNOSIS — I10 Essential (primary) hypertension: Secondary | ICD-10-CM | POA: Diagnosis not present

## 2014-04-19 DIAGNOSIS — F330 Major depressive disorder, recurrent, mild: Secondary | ICD-10-CM | POA: Diagnosis not present

## 2014-04-19 DIAGNOSIS — I2510 Atherosclerotic heart disease of native coronary artery without angina pectoris: Secondary | ICD-10-CM | POA: Diagnosis not present

## 2014-04-19 DIAGNOSIS — R279 Unspecified lack of coordination: Secondary | ICD-10-CM | POA: Diagnosis not present

## 2014-04-19 DIAGNOSIS — T8740 Infection of amputation stump, unspecified extremity: Secondary | ICD-10-CM | POA: Diagnosis not present

## 2014-04-19 DIAGNOSIS — E1152 Type 2 diabetes mellitus with diabetic peripheral angiopathy with gangrene: Secondary | ICD-10-CM | POA: Diagnosis not present

## 2014-04-19 DIAGNOSIS — M8660 Other chronic osteomyelitis, unspecified site: Secondary | ICD-10-CM | POA: Diagnosis not present

## 2014-04-19 DIAGNOSIS — F3341 Major depressive disorder, recurrent, in partial remission: Secondary | ICD-10-CM | POA: Diagnosis present

## 2014-04-19 DIAGNOSIS — T8131XA Disruption of external operation (surgical) wound, not elsewhere classified, initial encounter: Secondary | ICD-10-CM | POA: Diagnosis not present

## 2014-04-19 DIAGNOSIS — D649 Anemia, unspecified: Secondary | ICD-10-CM | POA: Diagnosis not present

## 2014-04-19 DIAGNOSIS — Z743 Need for continuous supervision: Secondary | ICD-10-CM | POA: Diagnosis not present

## 2014-04-19 DIAGNOSIS — I129 Hypertensive chronic kidney disease with stage 1 through stage 4 chronic kidney disease, or unspecified chronic kidney disease: Secondary | ICD-10-CM | POA: Diagnosis present

## 2014-04-19 DIAGNOSIS — Z89512 Acquired absence of left leg below knee: Secondary | ICD-10-CM | POA: Diagnosis not present

## 2014-04-19 DIAGNOSIS — E668 Other obesity: Secondary | ICD-10-CM | POA: Diagnosis present

## 2014-04-19 DIAGNOSIS — I739 Peripheral vascular disease, unspecified: Secondary | ICD-10-CM | POA: Diagnosis present

## 2014-04-19 DIAGNOSIS — N189 Chronic kidney disease, unspecified: Secondary | ICD-10-CM | POA: Diagnosis present

## 2014-04-19 DIAGNOSIS — Z888 Allergy status to other drugs, medicaments and biological substances status: Secondary | ICD-10-CM | POA: Diagnosis not present

## 2014-04-19 DIAGNOSIS — K219 Gastro-esophageal reflux disease without esophagitis: Secondary | ICD-10-CM | POA: Diagnosis present

## 2014-04-19 DIAGNOSIS — Z794 Long term (current) use of insulin: Secondary | ICD-10-CM | POA: Diagnosis not present

## 2014-04-19 DIAGNOSIS — Z79899 Other long term (current) drug therapy: Secondary | ICD-10-CM | POA: Diagnosis not present

## 2014-04-19 DIAGNOSIS — Z89511 Acquired absence of right leg below knee: Secondary | ICD-10-CM | POA: Diagnosis not present

## 2014-04-19 DIAGNOSIS — I96 Gangrene, not elsewhere classified: Secondary | ICD-10-CM | POA: Diagnosis not present

## 2014-04-19 DIAGNOSIS — T8781 Dehiscence of amputation stump: Secondary | ICD-10-CM | POA: Diagnosis present

## 2014-04-19 DIAGNOSIS — T8743 Infection of amputation stump, right lower extremity: Secondary | ICD-10-CM | POA: Diagnosis present

## 2014-04-19 DIAGNOSIS — Z8673 Personal history of transient ischemic attack (TIA), and cerebral infarction without residual deficits: Secondary | ICD-10-CM | POA: Diagnosis not present

## 2014-04-19 DIAGNOSIS — Y838 Other surgical procedures as the cause of abnormal reaction of the patient, or of later complication, without mention of misadventure at the time of the procedure: Secondary | ICD-10-CM | POA: Diagnosis not present

## 2014-04-19 DIAGNOSIS — E119 Type 2 diabetes mellitus without complications: Secondary | ICD-10-CM | POA: Diagnosis not present

## 2014-04-19 DIAGNOSIS — N183 Chronic kidney disease, stage 3 (moderate): Secondary | ICD-10-CM | POA: Diagnosis not present

## 2014-04-21 DIAGNOSIS — D649 Anemia, unspecified: Secondary | ICD-10-CM | POA: Diagnosis not present

## 2014-04-21 DIAGNOSIS — E119 Type 2 diabetes mellitus without complications: Secondary | ICD-10-CM | POA: Diagnosis not present

## 2014-04-21 DIAGNOSIS — Z89512 Acquired absence of left leg below knee: Secondary | ICD-10-CM | POA: Diagnosis not present

## 2014-04-21 DIAGNOSIS — I10 Essential (primary) hypertension: Secondary | ICD-10-CM | POA: Diagnosis not present

## 2014-04-21 DIAGNOSIS — L03115 Cellulitis of right lower limb: Secondary | ICD-10-CM | POA: Diagnosis not present

## 2014-04-21 DIAGNOSIS — F330 Major depressive disorder, recurrent, mild: Secondary | ICD-10-CM | POA: Diagnosis not present

## 2014-04-21 DIAGNOSIS — I2510 Atherosclerotic heart disease of native coronary artery without angina pectoris: Secondary | ICD-10-CM | POA: Diagnosis not present

## 2014-04-21 DIAGNOSIS — R279 Unspecified lack of coordination: Secondary | ICD-10-CM | POA: Diagnosis not present

## 2014-04-21 DIAGNOSIS — Z89511 Acquired absence of right leg below knee: Secondary | ICD-10-CM | POA: Diagnosis not present

## 2014-04-21 DIAGNOSIS — N183 Chronic kidney disease, stage 3 (moderate): Secondary | ICD-10-CM | POA: Diagnosis not present

## 2014-04-21 DIAGNOSIS — T8740 Infection of amputation stump, unspecified extremity: Secondary | ICD-10-CM | POA: Diagnosis not present

## 2014-04-21 DIAGNOSIS — Z743 Need for continuous supervision: Secondary | ICD-10-CM | POA: Diagnosis not present

## 2014-04-24 DIAGNOSIS — L03115 Cellulitis of right lower limb: Secondary | ICD-10-CM | POA: Diagnosis not present

## 2014-05-05 DIAGNOSIS — F330 Major depressive disorder, recurrent, mild: Secondary | ICD-10-CM | POA: Diagnosis not present

## 2014-05-05 DIAGNOSIS — E119 Type 2 diabetes mellitus without complications: Secondary | ICD-10-CM | POA: Diagnosis not present

## 2014-05-05 DIAGNOSIS — Z89512 Acquired absence of left leg below knee: Secondary | ICD-10-CM | POA: Diagnosis not present

## 2014-05-05 DIAGNOSIS — I2510 Atherosclerotic heart disease of native coronary artery without angina pectoris: Secondary | ICD-10-CM | POA: Diagnosis not present

## 2014-05-05 DIAGNOSIS — I10 Essential (primary) hypertension: Secondary | ICD-10-CM | POA: Diagnosis not present

## 2014-05-05 DIAGNOSIS — D649 Anemia, unspecified: Secondary | ICD-10-CM | POA: Diagnosis not present

## 2014-05-05 DIAGNOSIS — Z89511 Acquired absence of right leg below knee: Secondary | ICD-10-CM | POA: Diagnosis not present

## 2014-05-05 DIAGNOSIS — N183 Chronic kidney disease, stage 3 (moderate): Secondary | ICD-10-CM | POA: Diagnosis not present

## 2014-05-05 DIAGNOSIS — I82409 Acute embolism and thrombosis of unspecified deep veins of unspecified lower extremity: Secondary | ICD-10-CM | POA: Diagnosis not present

## 2014-05-06 DIAGNOSIS — E119 Type 2 diabetes mellitus without complications: Secondary | ICD-10-CM | POA: Diagnosis not present

## 2014-05-06 DIAGNOSIS — E1169 Type 2 diabetes mellitus with other specified complication: Secondary | ICD-10-CM | POA: Diagnosis not present

## 2014-05-06 DIAGNOSIS — Z89512 Acquired absence of left leg below knee: Secondary | ICD-10-CM | POA: Diagnosis not present

## 2014-05-06 DIAGNOSIS — I82409 Acute embolism and thrombosis of unspecified deep veins of unspecified lower extremity: Secondary | ICD-10-CM | POA: Diagnosis not present

## 2014-05-06 DIAGNOSIS — Z89511 Acquired absence of right leg below knee: Secondary | ICD-10-CM | POA: Diagnosis not present

## 2014-05-06 DIAGNOSIS — N183 Chronic kidney disease, stage 3 (moderate): Secondary | ICD-10-CM | POA: Diagnosis not present

## 2014-05-06 DIAGNOSIS — D649 Anemia, unspecified: Secondary | ICD-10-CM | POA: Diagnosis not present

## 2014-05-08 DIAGNOSIS — N183 Chronic kidney disease, stage 3 (moderate): Secondary | ICD-10-CM | POA: Diagnosis not present

## 2014-05-08 DIAGNOSIS — D649 Anemia, unspecified: Secondary | ICD-10-CM | POA: Diagnosis not present

## 2014-05-08 DIAGNOSIS — Z89511 Acquired absence of right leg below knee: Secondary | ICD-10-CM | POA: Diagnosis not present

## 2014-05-08 DIAGNOSIS — E119 Type 2 diabetes mellitus without complications: Secondary | ICD-10-CM | POA: Diagnosis not present

## 2014-05-08 DIAGNOSIS — I82409 Acute embolism and thrombosis of unspecified deep veins of unspecified lower extremity: Secondary | ICD-10-CM | POA: Diagnosis not present

## 2014-05-08 DIAGNOSIS — Z89512 Acquired absence of left leg below knee: Secondary | ICD-10-CM | POA: Diagnosis not present

## 2014-05-09 DIAGNOSIS — I82409 Acute embolism and thrombosis of unspecified deep veins of unspecified lower extremity: Secondary | ICD-10-CM | POA: Diagnosis not present

## 2014-05-09 DIAGNOSIS — Z89512 Acquired absence of left leg below knee: Secondary | ICD-10-CM | POA: Diagnosis not present

## 2014-05-09 DIAGNOSIS — N183 Chronic kidney disease, stage 3 (moderate): Secondary | ICD-10-CM | POA: Diagnosis not present

## 2014-05-09 DIAGNOSIS — Z89511 Acquired absence of right leg below knee: Secondary | ICD-10-CM | POA: Diagnosis not present

## 2014-05-09 DIAGNOSIS — D649 Anemia, unspecified: Secondary | ICD-10-CM | POA: Diagnosis not present

## 2014-05-09 DIAGNOSIS — E119 Type 2 diabetes mellitus without complications: Secondary | ICD-10-CM | POA: Diagnosis not present

## 2014-05-10 DIAGNOSIS — E119 Type 2 diabetes mellitus without complications: Secondary | ICD-10-CM | POA: Diagnosis not present

## 2014-05-10 DIAGNOSIS — D649 Anemia, unspecified: Secondary | ICD-10-CM | POA: Diagnosis not present

## 2014-05-10 DIAGNOSIS — Z89512 Acquired absence of left leg below knee: Secondary | ICD-10-CM | POA: Diagnosis not present

## 2014-05-10 DIAGNOSIS — N183 Chronic kidney disease, stage 3 (moderate): Secondary | ICD-10-CM | POA: Diagnosis not present

## 2014-05-10 DIAGNOSIS — Z89511 Acquired absence of right leg below knee: Secondary | ICD-10-CM | POA: Diagnosis not present

## 2014-05-10 DIAGNOSIS — I82409 Acute embolism and thrombosis of unspecified deep veins of unspecified lower extremity: Secondary | ICD-10-CM | POA: Diagnosis not present

## 2014-05-11 DIAGNOSIS — N183 Chronic kidney disease, stage 3 (moderate): Secondary | ICD-10-CM | POA: Diagnosis not present

## 2014-05-11 DIAGNOSIS — Z89512 Acquired absence of left leg below knee: Secondary | ICD-10-CM | POA: Diagnosis not present

## 2014-05-11 DIAGNOSIS — D649 Anemia, unspecified: Secondary | ICD-10-CM | POA: Diagnosis not present

## 2014-05-11 DIAGNOSIS — Z89511 Acquired absence of right leg below knee: Secondary | ICD-10-CM | POA: Diagnosis not present

## 2014-05-11 DIAGNOSIS — E119 Type 2 diabetes mellitus without complications: Secondary | ICD-10-CM | POA: Diagnosis not present

## 2014-05-11 DIAGNOSIS — I82409 Acute embolism and thrombosis of unspecified deep veins of unspecified lower extremity: Secondary | ICD-10-CM | POA: Diagnosis not present

## 2014-05-13 DIAGNOSIS — N183 Chronic kidney disease, stage 3 (moderate): Secondary | ICD-10-CM | POA: Diagnosis not present

## 2014-05-13 DIAGNOSIS — Z89511 Acquired absence of right leg below knee: Secondary | ICD-10-CM | POA: Diagnosis not present

## 2014-05-13 DIAGNOSIS — Z89512 Acquired absence of left leg below knee: Secondary | ICD-10-CM | POA: Diagnosis not present

## 2014-05-13 DIAGNOSIS — E119 Type 2 diabetes mellitus without complications: Secondary | ICD-10-CM | POA: Diagnosis not present

## 2014-05-13 DIAGNOSIS — D649 Anemia, unspecified: Secondary | ICD-10-CM | POA: Diagnosis not present

## 2014-05-13 DIAGNOSIS — I82409 Acute embolism and thrombosis of unspecified deep veins of unspecified lower extremity: Secondary | ICD-10-CM | POA: Diagnosis not present

## 2014-05-15 DIAGNOSIS — D649 Anemia, unspecified: Secondary | ICD-10-CM | POA: Diagnosis not present

## 2014-05-15 DIAGNOSIS — Z89512 Acquired absence of left leg below knee: Secondary | ICD-10-CM | POA: Diagnosis not present

## 2014-05-15 DIAGNOSIS — I82409 Acute embolism and thrombosis of unspecified deep veins of unspecified lower extremity: Secondary | ICD-10-CM | POA: Diagnosis not present

## 2014-05-15 DIAGNOSIS — E119 Type 2 diabetes mellitus without complications: Secondary | ICD-10-CM | POA: Diagnosis not present

## 2014-05-15 DIAGNOSIS — N183 Chronic kidney disease, stage 3 (moderate): Secondary | ICD-10-CM | POA: Diagnosis not present

## 2014-05-15 DIAGNOSIS — Z89511 Acquired absence of right leg below knee: Secondary | ICD-10-CM | POA: Diagnosis not present

## 2014-05-16 DIAGNOSIS — I82409 Acute embolism and thrombosis of unspecified deep veins of unspecified lower extremity: Secondary | ICD-10-CM | POA: Diagnosis not present

## 2014-05-16 DIAGNOSIS — E119 Type 2 diabetes mellitus without complications: Secondary | ICD-10-CM | POA: Diagnosis not present

## 2014-05-16 DIAGNOSIS — Z89512 Acquired absence of left leg below knee: Secondary | ICD-10-CM | POA: Diagnosis not present

## 2014-05-16 DIAGNOSIS — N183 Chronic kidney disease, stage 3 (moderate): Secondary | ICD-10-CM | POA: Diagnosis not present

## 2014-05-16 DIAGNOSIS — D649 Anemia, unspecified: Secondary | ICD-10-CM | POA: Diagnosis not present

## 2014-05-16 DIAGNOSIS — Z89511 Acquired absence of right leg below knee: Secondary | ICD-10-CM | POA: Diagnosis not present

## 2014-05-17 DIAGNOSIS — D649 Anemia, unspecified: Secondary | ICD-10-CM | POA: Diagnosis not present

## 2014-05-17 DIAGNOSIS — E119 Type 2 diabetes mellitus without complications: Secondary | ICD-10-CM | POA: Diagnosis not present

## 2014-05-17 DIAGNOSIS — Z89511 Acquired absence of right leg below knee: Secondary | ICD-10-CM | POA: Diagnosis not present

## 2014-05-17 DIAGNOSIS — I82409 Acute embolism and thrombosis of unspecified deep veins of unspecified lower extremity: Secondary | ICD-10-CM | POA: Diagnosis not present

## 2014-05-17 DIAGNOSIS — N183 Chronic kidney disease, stage 3 (moderate): Secondary | ICD-10-CM | POA: Diagnosis not present

## 2014-05-17 DIAGNOSIS — Z89512 Acquired absence of left leg below knee: Secondary | ICD-10-CM | POA: Diagnosis not present

## 2014-05-18 DIAGNOSIS — Z89511 Acquired absence of right leg below knee: Secondary | ICD-10-CM | POA: Diagnosis not present

## 2014-05-18 DIAGNOSIS — Z89512 Acquired absence of left leg below knee: Secondary | ICD-10-CM | POA: Diagnosis not present

## 2014-05-18 DIAGNOSIS — D649 Anemia, unspecified: Secondary | ICD-10-CM | POA: Diagnosis not present

## 2014-05-18 DIAGNOSIS — N183 Chronic kidney disease, stage 3 (moderate): Secondary | ICD-10-CM | POA: Diagnosis not present

## 2014-05-18 DIAGNOSIS — E119 Type 2 diabetes mellitus without complications: Secondary | ICD-10-CM | POA: Diagnosis not present

## 2014-05-18 DIAGNOSIS — I82409 Acute embolism and thrombosis of unspecified deep veins of unspecified lower extremity: Secondary | ICD-10-CM | POA: Diagnosis not present

## 2014-05-19 DIAGNOSIS — D649 Anemia, unspecified: Secondary | ICD-10-CM | POA: Diagnosis not present

## 2014-05-19 DIAGNOSIS — Z89511 Acquired absence of right leg below knee: Secondary | ICD-10-CM | POA: Diagnosis not present

## 2014-05-19 DIAGNOSIS — N183 Chronic kidney disease, stage 3 (moderate): Secondary | ICD-10-CM | POA: Diagnosis not present

## 2014-05-19 DIAGNOSIS — I82409 Acute embolism and thrombosis of unspecified deep veins of unspecified lower extremity: Secondary | ICD-10-CM | POA: Diagnosis not present

## 2014-05-19 DIAGNOSIS — Z89512 Acquired absence of left leg below knee: Secondary | ICD-10-CM | POA: Diagnosis not present

## 2014-05-19 DIAGNOSIS — E119 Type 2 diabetes mellitus without complications: Secondary | ICD-10-CM | POA: Diagnosis not present

## 2014-05-22 DIAGNOSIS — D649 Anemia, unspecified: Secondary | ICD-10-CM | POA: Diagnosis not present

## 2014-05-22 DIAGNOSIS — N183 Chronic kidney disease, stage 3 (moderate): Secondary | ICD-10-CM | POA: Diagnosis not present

## 2014-05-22 DIAGNOSIS — Z89511 Acquired absence of right leg below knee: Secondary | ICD-10-CM | POA: Diagnosis not present

## 2014-05-22 DIAGNOSIS — Z89512 Acquired absence of left leg below knee: Secondary | ICD-10-CM | POA: Diagnosis not present

## 2014-05-22 DIAGNOSIS — I82409 Acute embolism and thrombosis of unspecified deep veins of unspecified lower extremity: Secondary | ICD-10-CM | POA: Diagnosis not present

## 2014-05-22 DIAGNOSIS — E119 Type 2 diabetes mellitus without complications: Secondary | ICD-10-CM | POA: Diagnosis not present

## 2014-05-23 DIAGNOSIS — Z89512 Acquired absence of left leg below knee: Secondary | ICD-10-CM | POA: Diagnosis not present

## 2014-05-23 DIAGNOSIS — N183 Chronic kidney disease, stage 3 (moderate): Secondary | ICD-10-CM | POA: Diagnosis not present

## 2014-05-23 DIAGNOSIS — E119 Type 2 diabetes mellitus without complications: Secondary | ICD-10-CM | POA: Diagnosis not present

## 2014-05-23 DIAGNOSIS — I82409 Acute embolism and thrombosis of unspecified deep veins of unspecified lower extremity: Secondary | ICD-10-CM | POA: Diagnosis not present

## 2014-05-23 DIAGNOSIS — Z89511 Acquired absence of right leg below knee: Secondary | ICD-10-CM | POA: Diagnosis not present

## 2014-05-23 DIAGNOSIS — D649 Anemia, unspecified: Secondary | ICD-10-CM | POA: Diagnosis not present

## 2014-05-24 DIAGNOSIS — D649 Anemia, unspecified: Secondary | ICD-10-CM | POA: Diagnosis not present

## 2014-05-24 DIAGNOSIS — I82409 Acute embolism and thrombosis of unspecified deep veins of unspecified lower extremity: Secondary | ICD-10-CM | POA: Diagnosis not present

## 2014-05-24 DIAGNOSIS — E119 Type 2 diabetes mellitus without complications: Secondary | ICD-10-CM | POA: Diagnosis not present

## 2014-05-24 DIAGNOSIS — Z89512 Acquired absence of left leg below knee: Secondary | ICD-10-CM | POA: Diagnosis not present

## 2014-05-24 DIAGNOSIS — N183 Chronic kidney disease, stage 3 (moderate): Secondary | ICD-10-CM | POA: Diagnosis not present

## 2014-05-24 DIAGNOSIS — Z89511 Acquired absence of right leg below knee: Secondary | ICD-10-CM | POA: Diagnosis not present

## 2014-05-25 DIAGNOSIS — N183 Chronic kidney disease, stage 3 (moderate): Secondary | ICD-10-CM | POA: Diagnosis not present

## 2014-05-25 DIAGNOSIS — I82409 Acute embolism and thrombosis of unspecified deep veins of unspecified lower extremity: Secondary | ICD-10-CM | POA: Diagnosis not present

## 2014-05-25 DIAGNOSIS — D649 Anemia, unspecified: Secondary | ICD-10-CM | POA: Diagnosis not present

## 2014-05-25 DIAGNOSIS — E119 Type 2 diabetes mellitus without complications: Secondary | ICD-10-CM | POA: Diagnosis not present

## 2014-05-25 DIAGNOSIS — Z89511 Acquired absence of right leg below knee: Secondary | ICD-10-CM | POA: Diagnosis not present

## 2014-05-25 DIAGNOSIS — Z89512 Acquired absence of left leg below knee: Secondary | ICD-10-CM | POA: Diagnosis not present

## 2014-05-27 DIAGNOSIS — Z89511 Acquired absence of right leg below knee: Secondary | ICD-10-CM | POA: Diagnosis not present

## 2014-05-27 DIAGNOSIS — D649 Anemia, unspecified: Secondary | ICD-10-CM | POA: Diagnosis not present

## 2014-05-27 DIAGNOSIS — E119 Type 2 diabetes mellitus without complications: Secondary | ICD-10-CM | POA: Diagnosis not present

## 2014-05-27 DIAGNOSIS — I82409 Acute embolism and thrombosis of unspecified deep veins of unspecified lower extremity: Secondary | ICD-10-CM | POA: Diagnosis not present

## 2014-05-27 DIAGNOSIS — Z89512 Acquired absence of left leg below knee: Secondary | ICD-10-CM | POA: Diagnosis not present

## 2014-05-27 DIAGNOSIS — N183 Chronic kidney disease, stage 3 (moderate): Secondary | ICD-10-CM | POA: Diagnosis not present

## 2014-05-29 DIAGNOSIS — D649 Anemia, unspecified: Secondary | ICD-10-CM | POA: Diagnosis not present

## 2014-05-29 DIAGNOSIS — Z89512 Acquired absence of left leg below knee: Secondary | ICD-10-CM | POA: Diagnosis not present

## 2014-05-29 DIAGNOSIS — N183 Chronic kidney disease, stage 3 (moderate): Secondary | ICD-10-CM | POA: Diagnosis not present

## 2014-05-29 DIAGNOSIS — I82409 Acute embolism and thrombosis of unspecified deep veins of unspecified lower extremity: Secondary | ICD-10-CM | POA: Diagnosis not present

## 2014-05-29 DIAGNOSIS — E119 Type 2 diabetes mellitus without complications: Secondary | ICD-10-CM | POA: Diagnosis not present

## 2014-05-29 DIAGNOSIS — Z89511 Acquired absence of right leg below knee: Secondary | ICD-10-CM | POA: Diagnosis not present

## 2014-05-30 DIAGNOSIS — M6281 Muscle weakness (generalized): Secondary | ICD-10-CM | POA: Diagnosis not present

## 2014-05-30 DIAGNOSIS — Z89511 Acquired absence of right leg below knee: Secondary | ICD-10-CM | POA: Diagnosis not present

## 2014-05-31 DIAGNOSIS — Z89511 Acquired absence of right leg below knee: Secondary | ICD-10-CM | POA: Diagnosis not present

## 2014-05-31 DIAGNOSIS — M6281 Muscle weakness (generalized): Secondary | ICD-10-CM | POA: Diagnosis not present

## 2014-06-01 DIAGNOSIS — Z89511 Acquired absence of right leg below knee: Secondary | ICD-10-CM | POA: Diagnosis not present

## 2014-06-01 DIAGNOSIS — M6281 Muscle weakness (generalized): Secondary | ICD-10-CM | POA: Diagnosis not present

## 2014-06-02 DIAGNOSIS — Z89511 Acquired absence of right leg below knee: Secondary | ICD-10-CM | POA: Diagnosis not present

## 2014-06-02 DIAGNOSIS — I10 Essential (primary) hypertension: Secondary | ICD-10-CM | POA: Diagnosis not present

## 2014-06-02 DIAGNOSIS — D649 Anemia, unspecified: Secondary | ICD-10-CM | POA: Diagnosis not present

## 2014-06-02 DIAGNOSIS — M6281 Muscle weakness (generalized): Secondary | ICD-10-CM | POA: Diagnosis not present

## 2014-06-05 DIAGNOSIS — Z89511 Acquired absence of right leg below knee: Secondary | ICD-10-CM | POA: Diagnosis not present

## 2014-06-05 DIAGNOSIS — M6281 Muscle weakness (generalized): Secondary | ICD-10-CM | POA: Diagnosis not present

## 2014-06-05 DIAGNOSIS — T8131XA Disruption of external operation (surgical) wound, not elsewhere classified, initial encounter: Secondary | ICD-10-CM | POA: Diagnosis not present

## 2014-06-06 DIAGNOSIS — Z89511 Acquired absence of right leg below knee: Secondary | ICD-10-CM | POA: Diagnosis not present

## 2014-06-06 DIAGNOSIS — M6281 Muscle weakness (generalized): Secondary | ICD-10-CM | POA: Diagnosis not present

## 2014-06-07 DIAGNOSIS — M6281 Muscle weakness (generalized): Secondary | ICD-10-CM | POA: Diagnosis not present

## 2014-06-07 DIAGNOSIS — Z89511 Acquired absence of right leg below knee: Secondary | ICD-10-CM | POA: Diagnosis not present

## 2014-06-08 DIAGNOSIS — Z89511 Acquired absence of right leg below knee: Secondary | ICD-10-CM | POA: Diagnosis not present

## 2014-06-08 DIAGNOSIS — M6281 Muscle weakness (generalized): Secondary | ICD-10-CM | POA: Diagnosis not present

## 2014-06-09 DIAGNOSIS — M6281 Muscle weakness (generalized): Secondary | ICD-10-CM | POA: Diagnosis not present

## 2014-06-09 DIAGNOSIS — Z89511 Acquired absence of right leg below knee: Secondary | ICD-10-CM | POA: Diagnosis not present

## 2014-06-12 DIAGNOSIS — M6281 Muscle weakness (generalized): Secondary | ICD-10-CM | POA: Diagnosis not present

## 2014-06-12 DIAGNOSIS — Z89511 Acquired absence of right leg below knee: Secondary | ICD-10-CM | POA: Diagnosis not present

## 2014-06-13 DIAGNOSIS — M6281 Muscle weakness (generalized): Secondary | ICD-10-CM | POA: Diagnosis not present

## 2014-06-13 DIAGNOSIS — L97414 Non-pressure chronic ulcer of right heel and midfoot with necrosis of bone: Secondary | ICD-10-CM | POA: Diagnosis not present

## 2014-06-13 DIAGNOSIS — E119 Type 2 diabetes mellitus without complications: Secondary | ICD-10-CM | POA: Diagnosis not present

## 2014-06-13 DIAGNOSIS — Z89511 Acquired absence of right leg below knee: Secondary | ICD-10-CM | POA: Diagnosis not present

## 2014-06-13 DIAGNOSIS — T86821 Skin graft (allograft) (autograft) failure: Secondary | ICD-10-CM | POA: Diagnosis not present

## 2014-06-13 DIAGNOSIS — E11621 Type 2 diabetes mellitus with foot ulcer: Secondary | ICD-10-CM | POA: Diagnosis not present

## 2014-06-14 DIAGNOSIS — Z89511 Acquired absence of right leg below knee: Secondary | ICD-10-CM | POA: Diagnosis not present

## 2014-06-14 DIAGNOSIS — F411 Generalized anxiety disorder: Secondary | ICD-10-CM | POA: Diagnosis not present

## 2014-06-14 DIAGNOSIS — F329 Major depressive disorder, single episode, unspecified: Secondary | ICD-10-CM | POA: Diagnosis not present

## 2014-06-14 DIAGNOSIS — M6281 Muscle weakness (generalized): Secondary | ICD-10-CM | POA: Diagnosis not present

## 2014-06-15 DIAGNOSIS — Z89511 Acquired absence of right leg below knee: Secondary | ICD-10-CM | POA: Diagnosis not present

## 2014-06-15 DIAGNOSIS — M6281 Muscle weakness (generalized): Secondary | ICD-10-CM | POA: Diagnosis not present

## 2014-06-16 DIAGNOSIS — Z89511 Acquired absence of right leg below knee: Secondary | ICD-10-CM | POA: Diagnosis not present

## 2014-06-16 DIAGNOSIS — M6281 Muscle weakness (generalized): Secondary | ICD-10-CM | POA: Diagnosis not present

## 2014-06-19 DIAGNOSIS — Z89511 Acquired absence of right leg below knee: Secondary | ICD-10-CM | POA: Diagnosis not present

## 2014-06-19 DIAGNOSIS — M6281 Muscle weakness (generalized): Secondary | ICD-10-CM | POA: Diagnosis not present

## 2014-06-20 DIAGNOSIS — Z89511 Acquired absence of right leg below knee: Secondary | ICD-10-CM | POA: Diagnosis not present

## 2014-06-20 DIAGNOSIS — T86821 Skin graft (allograft) (autograft) failure: Secondary | ICD-10-CM | POA: Diagnosis not present

## 2014-06-20 DIAGNOSIS — L97414 Non-pressure chronic ulcer of right heel and midfoot with necrosis of bone: Secondary | ICD-10-CM | POA: Diagnosis not present

## 2014-06-20 DIAGNOSIS — M6281 Muscle weakness (generalized): Secondary | ICD-10-CM | POA: Diagnosis not present

## 2014-06-20 DIAGNOSIS — E119 Type 2 diabetes mellitus without complications: Secondary | ICD-10-CM | POA: Diagnosis not present

## 2014-06-20 DIAGNOSIS — E11621 Type 2 diabetes mellitus with foot ulcer: Secondary | ICD-10-CM | POA: Diagnosis not present

## 2014-06-21 DIAGNOSIS — Z89511 Acquired absence of right leg below knee: Secondary | ICD-10-CM | POA: Diagnosis not present

## 2014-06-21 DIAGNOSIS — T8189XA Other complications of procedures, not elsewhere classified, initial encounter: Secondary | ICD-10-CM | POA: Diagnosis not present

## 2014-06-21 DIAGNOSIS — M6281 Muscle weakness (generalized): Secondary | ICD-10-CM | POA: Diagnosis not present

## 2014-06-22 DIAGNOSIS — M6281 Muscle weakness (generalized): Secondary | ICD-10-CM | POA: Diagnosis not present

## 2014-06-22 DIAGNOSIS — Z89511 Acquired absence of right leg below knee: Secondary | ICD-10-CM | POA: Diagnosis not present

## 2014-06-23 DIAGNOSIS — M6281 Muscle weakness (generalized): Secondary | ICD-10-CM | POA: Diagnosis not present

## 2014-06-23 DIAGNOSIS — Z89511 Acquired absence of right leg below knee: Secondary | ICD-10-CM | POA: Diagnosis not present

## 2014-06-28 DIAGNOSIS — E119 Type 2 diabetes mellitus without complications: Secondary | ICD-10-CM | POA: Diagnosis not present

## 2014-06-28 DIAGNOSIS — T86821 Skin graft (allograft) (autograft) failure: Secondary | ICD-10-CM | POA: Diagnosis not present

## 2014-06-28 DIAGNOSIS — E11621 Type 2 diabetes mellitus with foot ulcer: Secondary | ICD-10-CM | POA: Diagnosis not present

## 2014-06-28 DIAGNOSIS — Z89511 Acquired absence of right leg below knee: Secondary | ICD-10-CM | POA: Diagnosis not present

## 2014-06-28 DIAGNOSIS — L97414 Non-pressure chronic ulcer of right heel and midfoot with necrosis of bone: Secondary | ICD-10-CM | POA: Diagnosis not present

## 2014-06-29 DIAGNOSIS — L97414 Non-pressure chronic ulcer of right heel and midfoot with necrosis of bone: Secondary | ICD-10-CM | POA: Diagnosis not present

## 2014-06-29 DIAGNOSIS — E119 Type 2 diabetes mellitus without complications: Secondary | ICD-10-CM | POA: Diagnosis not present

## 2014-06-29 DIAGNOSIS — Z89511 Acquired absence of right leg below knee: Secondary | ICD-10-CM | POA: Diagnosis not present

## 2014-06-29 DIAGNOSIS — T86821 Skin graft (allograft) (autograft) failure: Secondary | ICD-10-CM | POA: Diagnosis not present

## 2014-06-29 DIAGNOSIS — E11621 Type 2 diabetes mellitus with foot ulcer: Secondary | ICD-10-CM | POA: Diagnosis not present

## 2014-07-02 DIAGNOSIS — J309 Allergic rhinitis, unspecified: Secondary | ICD-10-CM | POA: Diagnosis not present

## 2014-07-04 DIAGNOSIS — E119 Type 2 diabetes mellitus without complications: Secondary | ICD-10-CM | POA: Diagnosis not present

## 2014-07-04 DIAGNOSIS — Z89511 Acquired absence of right leg below knee: Secondary | ICD-10-CM | POA: Diagnosis not present

## 2014-07-04 DIAGNOSIS — T86821 Skin graft (allograft) (autograft) failure: Secondary | ICD-10-CM | POA: Diagnosis not present

## 2014-07-05 DIAGNOSIS — T86821 Skin graft (allograft) (autograft) failure: Secondary | ICD-10-CM | POA: Diagnosis not present

## 2014-07-05 DIAGNOSIS — Z89511 Acquired absence of right leg below knee: Secondary | ICD-10-CM | POA: Diagnosis not present

## 2014-07-05 DIAGNOSIS — T8781 Dehiscence of amputation stump: Secondary | ICD-10-CM | POA: Diagnosis not present

## 2014-07-05 DIAGNOSIS — E119 Type 2 diabetes mellitus without complications: Secondary | ICD-10-CM | POA: Diagnosis not present

## 2014-07-06 DIAGNOSIS — Z89511 Acquired absence of right leg below knee: Secondary | ICD-10-CM | POA: Diagnosis not present

## 2014-07-06 DIAGNOSIS — T86821 Skin graft (allograft) (autograft) failure: Secondary | ICD-10-CM | POA: Diagnosis not present

## 2014-07-06 DIAGNOSIS — E119 Type 2 diabetes mellitus without complications: Secondary | ICD-10-CM | POA: Diagnosis not present

## 2014-07-11 DIAGNOSIS — Z89511 Acquired absence of right leg below knee: Secondary | ICD-10-CM | POA: Diagnosis not present

## 2014-07-11 DIAGNOSIS — E119 Type 2 diabetes mellitus without complications: Secondary | ICD-10-CM | POA: Diagnosis not present

## 2014-07-11 DIAGNOSIS — T86821 Skin graft (allograft) (autograft) failure: Secondary | ICD-10-CM | POA: Diagnosis not present

## 2014-07-12 DIAGNOSIS — F329 Major depressive disorder, single episode, unspecified: Secondary | ICD-10-CM | POA: Diagnosis not present

## 2014-07-12 DIAGNOSIS — F411 Generalized anxiety disorder: Secondary | ICD-10-CM | POA: Diagnosis not present

## 2014-07-12 DIAGNOSIS — T86821 Skin graft (allograft) (autograft) failure: Secondary | ICD-10-CM | POA: Diagnosis not present

## 2014-07-12 DIAGNOSIS — E119 Type 2 diabetes mellitus without complications: Secondary | ICD-10-CM | POA: Diagnosis not present

## 2014-07-12 DIAGNOSIS — Z89511 Acquired absence of right leg below knee: Secondary | ICD-10-CM | POA: Diagnosis not present

## 2014-07-13 DIAGNOSIS — Z89511 Acquired absence of right leg below knee: Secondary | ICD-10-CM | POA: Diagnosis not present

## 2014-07-13 DIAGNOSIS — E119 Type 2 diabetes mellitus without complications: Secondary | ICD-10-CM | POA: Diagnosis not present

## 2014-07-13 DIAGNOSIS — T86821 Skin graft (allograft) (autograft) failure: Secondary | ICD-10-CM | POA: Diagnosis not present

## 2014-07-17 DIAGNOSIS — E11359 Type 2 diabetes mellitus with proliferative diabetic retinopathy without macular edema: Secondary | ICD-10-CM | POA: Diagnosis not present

## 2014-07-24 DIAGNOSIS — H5213 Myopia, bilateral: Secondary | ICD-10-CM | POA: Diagnosis not present

## 2014-08-09 DIAGNOSIS — D649 Anemia, unspecified: Secondary | ICD-10-CM | POA: Diagnosis not present

## 2014-08-09 DIAGNOSIS — E119 Type 2 diabetes mellitus without complications: Secondary | ICD-10-CM | POA: Diagnosis not present

## 2014-08-14 DIAGNOSIS — E1152 Type 2 diabetes mellitus with diabetic peripheral angiopathy with gangrene: Secondary | ICD-10-CM | POA: Diagnosis not present

## 2014-08-14 DIAGNOSIS — T8131XA Disruption of external operation (surgical) wound, not elsewhere classified, initial encounter: Secondary | ICD-10-CM | POA: Diagnosis not present

## 2014-08-14 DIAGNOSIS — E1159 Type 2 diabetes mellitus with other circulatory complications: Secondary | ICD-10-CM | POA: Diagnosis not present

## 2014-08-21 DIAGNOSIS — F329 Major depressive disorder, single episode, unspecified: Secondary | ICD-10-CM | POA: Diagnosis not present

## 2014-08-27 DIAGNOSIS — L03115 Cellulitis of right lower limb: Secondary | ICD-10-CM | POA: Diagnosis not present

## 2014-09-04 DIAGNOSIS — L03115 Cellulitis of right lower limb: Secondary | ICD-10-CM | POA: Diagnosis not present

## 2014-09-04 DIAGNOSIS — T8131XA Disruption of external operation (surgical) wound, not elsewhere classified, initial encounter: Secondary | ICD-10-CM | POA: Diagnosis not present

## 2014-09-05 DIAGNOSIS — M7989 Other specified soft tissue disorders: Secondary | ICD-10-CM | POA: Diagnosis not present

## 2014-09-13 DIAGNOSIS — T8131XA Disruption of external operation (surgical) wound, not elsewhere classified, initial encounter: Secondary | ICD-10-CM | POA: Diagnosis not present

## 2014-09-13 DIAGNOSIS — L02419 Cutaneous abscess of limb, unspecified: Secondary | ICD-10-CM | POA: Diagnosis not present

## 2014-09-13 DIAGNOSIS — E1159 Type 2 diabetes mellitus with other circulatory complications: Secondary | ICD-10-CM | POA: Diagnosis not present

## 2014-09-13 DIAGNOSIS — E1152 Type 2 diabetes mellitus with diabetic peripheral angiopathy with gangrene: Secondary | ICD-10-CM | POA: Diagnosis not present

## 2014-09-25 DIAGNOSIS — A047 Enterocolitis due to Clostridium difficile: Secondary | ICD-10-CM | POA: Diagnosis not present

## 2014-09-27 DIAGNOSIS — E780 Pure hypercholesterolemia: Secondary | ICD-10-CM | POA: Diagnosis not present

## 2014-09-27 DIAGNOSIS — D509 Iron deficiency anemia, unspecified: Secondary | ICD-10-CM | POA: Diagnosis not present

## 2014-09-27 DIAGNOSIS — I10 Essential (primary) hypertension: Secondary | ICD-10-CM | POA: Diagnosis not present

## 2014-09-27 DIAGNOSIS — Z888 Allergy status to other drugs, medicaments and biological substances status: Secondary | ICD-10-CM | POA: Diagnosis not present

## 2014-09-27 DIAGNOSIS — Z79899 Other long term (current) drug therapy: Secondary | ICD-10-CM | POA: Diagnosis not present

## 2014-09-27 DIAGNOSIS — Z8673 Personal history of transient ischemic attack (TIA), and cerebral infarction without residual deficits: Secondary | ICD-10-CM | POA: Diagnosis not present

## 2014-09-27 DIAGNOSIS — Z794 Long term (current) use of insulin: Secondary | ICD-10-CM | POA: Diagnosis not present

## 2014-09-27 DIAGNOSIS — A045 Campylobacter enteritis: Secondary | ICD-10-CM | POA: Diagnosis not present

## 2014-09-27 DIAGNOSIS — Z7401 Bed confinement status: Secondary | ICD-10-CM | POA: Diagnosis not present

## 2014-09-27 DIAGNOSIS — R030 Elevated blood-pressure reading, without diagnosis of hypertension: Secondary | ICD-10-CM | POA: Diagnosis not present

## 2014-09-27 DIAGNOSIS — R1084 Generalized abdominal pain: Secondary | ICD-10-CM | POA: Diagnosis not present

## 2014-09-27 DIAGNOSIS — Z833 Family history of diabetes mellitus: Secondary | ICD-10-CM | POA: Diagnosis not present

## 2014-09-27 DIAGNOSIS — K219 Gastro-esophageal reflux disease without esophagitis: Secondary | ICD-10-CM | POA: Diagnosis not present

## 2014-09-27 DIAGNOSIS — R112 Nausea with vomiting, unspecified: Secondary | ICD-10-CM | POA: Diagnosis not present

## 2014-09-27 DIAGNOSIS — E119 Type 2 diabetes mellitus without complications: Secondary | ICD-10-CM | POA: Diagnosis not present

## 2014-09-27 DIAGNOSIS — F329 Major depressive disorder, single episode, unspecified: Secondary | ICD-10-CM | POA: Diagnosis not present

## 2014-10-04 DIAGNOSIS — T8131XA Disruption of external operation (surgical) wound, not elsewhere classified, initial encounter: Secondary | ICD-10-CM | POA: Diagnosis not present

## 2014-10-04 DIAGNOSIS — L03119 Cellulitis of unspecified part of limb: Secondary | ICD-10-CM | POA: Diagnosis not present

## 2014-10-06 DIAGNOSIS — B354 Tinea corporis: Secondary | ICD-10-CM | POA: Diagnosis not present

## 2014-10-10 DIAGNOSIS — E1140 Type 2 diabetes mellitus with diabetic neuropathy, unspecified: Secondary | ICD-10-CM | POA: Diagnosis not present

## 2014-10-10 DIAGNOSIS — M6281 Muscle weakness (generalized): Secondary | ICD-10-CM | POA: Diagnosis not present

## 2014-10-10 DIAGNOSIS — F330 Major depressive disorder, recurrent, mild: Secondary | ICD-10-CM | POA: Diagnosis not present

## 2014-10-10 DIAGNOSIS — D649 Anemia, unspecified: Secondary | ICD-10-CM | POA: Diagnosis not present

## 2014-10-10 DIAGNOSIS — I6789 Other cerebrovascular disease: Secondary | ICD-10-CM | POA: Diagnosis not present

## 2014-10-10 DIAGNOSIS — I10 Essential (primary) hypertension: Secondary | ICD-10-CM | POA: Diagnosis not present

## 2014-10-10 DIAGNOSIS — I82401 Acute embolism and thrombosis of unspecified deep veins of right lower extremity: Secondary | ICD-10-CM | POA: Diagnosis not present

## 2014-10-10 DIAGNOSIS — N939 Abnormal uterine and vaginal bleeding, unspecified: Secondary | ICD-10-CM | POA: Diagnosis not present

## 2014-10-10 DIAGNOSIS — Z89512 Acquired absence of left leg below knee: Secondary | ICD-10-CM | POA: Diagnosis not present

## 2014-10-10 DIAGNOSIS — Z89511 Acquired absence of right leg below knee: Secondary | ICD-10-CM | POA: Diagnosis not present

## 2014-10-10 DIAGNOSIS — G894 Chronic pain syndrome: Secondary | ICD-10-CM | POA: Diagnosis not present

## 2014-10-10 DIAGNOSIS — M6249 Contracture of muscle, multiple sites: Secondary | ICD-10-CM | POA: Diagnosis not present

## 2014-10-10 DIAGNOSIS — R279 Unspecified lack of coordination: Secondary | ICD-10-CM | POA: Diagnosis not present

## 2014-10-10 DIAGNOSIS — N183 Chronic kidney disease, stage 3 (moderate): Secondary | ICD-10-CM | POA: Diagnosis not present

## 2014-10-10 DIAGNOSIS — I2510 Atherosclerotic heart disease of native coronary artery without angina pectoris: Secondary | ICD-10-CM | POA: Diagnosis not present

## 2014-10-11 DIAGNOSIS — Z89512 Acquired absence of left leg below knee: Secondary | ICD-10-CM | POA: Diagnosis not present

## 2014-10-11 DIAGNOSIS — N183 Chronic kidney disease, stage 3 (moderate): Secondary | ICD-10-CM | POA: Diagnosis not present

## 2014-10-11 DIAGNOSIS — D649 Anemia, unspecified: Secondary | ICD-10-CM | POA: Diagnosis not present

## 2014-10-11 DIAGNOSIS — Z89511 Acquired absence of right leg below knee: Secondary | ICD-10-CM | POA: Diagnosis not present

## 2014-10-11 DIAGNOSIS — E1140 Type 2 diabetes mellitus with diabetic neuropathy, unspecified: Secondary | ICD-10-CM | POA: Diagnosis not present

## 2014-10-11 DIAGNOSIS — I82401 Acute embolism and thrombosis of unspecified deep veins of right lower extremity: Secondary | ICD-10-CM | POA: Diagnosis not present

## 2014-10-12 DIAGNOSIS — Z89511 Acquired absence of right leg below knee: Secondary | ICD-10-CM | POA: Diagnosis not present

## 2014-10-12 DIAGNOSIS — Z89512 Acquired absence of left leg below knee: Secondary | ICD-10-CM | POA: Diagnosis not present

## 2014-10-12 DIAGNOSIS — D649 Anemia, unspecified: Secondary | ICD-10-CM | POA: Diagnosis not present

## 2014-10-12 DIAGNOSIS — N183 Chronic kidney disease, stage 3 (moderate): Secondary | ICD-10-CM | POA: Diagnosis not present

## 2014-10-12 DIAGNOSIS — I82401 Acute embolism and thrombosis of unspecified deep veins of right lower extremity: Secondary | ICD-10-CM | POA: Diagnosis not present

## 2014-10-12 DIAGNOSIS — E1140 Type 2 diabetes mellitus with diabetic neuropathy, unspecified: Secondary | ICD-10-CM | POA: Diagnosis not present

## 2014-10-13 DIAGNOSIS — N183 Chronic kidney disease, stage 3 (moderate): Secondary | ICD-10-CM | POA: Diagnosis not present

## 2014-10-13 DIAGNOSIS — E1140 Type 2 diabetes mellitus with diabetic neuropathy, unspecified: Secondary | ICD-10-CM | POA: Diagnosis not present

## 2014-10-13 DIAGNOSIS — Z89512 Acquired absence of left leg below knee: Secondary | ICD-10-CM | POA: Diagnosis not present

## 2014-10-13 DIAGNOSIS — Z89511 Acquired absence of right leg below knee: Secondary | ICD-10-CM | POA: Diagnosis not present

## 2014-10-13 DIAGNOSIS — D649 Anemia, unspecified: Secondary | ICD-10-CM | POA: Diagnosis not present

## 2014-10-13 DIAGNOSIS — I82401 Acute embolism and thrombosis of unspecified deep veins of right lower extremity: Secondary | ICD-10-CM | POA: Diagnosis not present

## 2014-10-16 DIAGNOSIS — N183 Chronic kidney disease, stage 3 (moderate): Secondary | ICD-10-CM | POA: Diagnosis not present

## 2014-10-16 DIAGNOSIS — I82401 Acute embolism and thrombosis of unspecified deep veins of right lower extremity: Secondary | ICD-10-CM | POA: Diagnosis not present

## 2014-10-16 DIAGNOSIS — Z89512 Acquired absence of left leg below knee: Secondary | ICD-10-CM | POA: Diagnosis not present

## 2014-10-16 DIAGNOSIS — D649 Anemia, unspecified: Secondary | ICD-10-CM | POA: Diagnosis not present

## 2014-10-16 DIAGNOSIS — Z89511 Acquired absence of right leg below knee: Secondary | ICD-10-CM | POA: Diagnosis not present

## 2014-10-16 DIAGNOSIS — E1140 Type 2 diabetes mellitus with diabetic neuropathy, unspecified: Secondary | ICD-10-CM | POA: Diagnosis not present

## 2014-10-17 DIAGNOSIS — Z89512 Acquired absence of left leg below knee: Secondary | ICD-10-CM | POA: Diagnosis not present

## 2014-10-17 DIAGNOSIS — I82401 Acute embolism and thrombosis of unspecified deep veins of right lower extremity: Secondary | ICD-10-CM | POA: Diagnosis not present

## 2014-10-17 DIAGNOSIS — E1140 Type 2 diabetes mellitus with diabetic neuropathy, unspecified: Secondary | ICD-10-CM | POA: Diagnosis not present

## 2014-10-17 DIAGNOSIS — D649 Anemia, unspecified: Secondary | ICD-10-CM | POA: Diagnosis not present

## 2014-10-17 DIAGNOSIS — Z89511 Acquired absence of right leg below knee: Secondary | ICD-10-CM | POA: Diagnosis not present

## 2014-10-17 DIAGNOSIS — N183 Chronic kidney disease, stage 3 (moderate): Secondary | ICD-10-CM | POA: Diagnosis not present

## 2014-10-18 DIAGNOSIS — E119 Type 2 diabetes mellitus without complications: Secondary | ICD-10-CM | POA: Diagnosis not present

## 2014-10-18 DIAGNOSIS — Z89511 Acquired absence of right leg below knee: Secondary | ICD-10-CM | POA: Diagnosis not present

## 2014-10-18 DIAGNOSIS — E1140 Type 2 diabetes mellitus with diabetic neuropathy, unspecified: Secondary | ICD-10-CM | POA: Diagnosis not present

## 2014-10-18 DIAGNOSIS — I10 Essential (primary) hypertension: Secondary | ICD-10-CM | POA: Diagnosis not present

## 2014-10-18 DIAGNOSIS — N183 Chronic kidney disease, stage 3 (moderate): Secondary | ICD-10-CM | POA: Diagnosis not present

## 2014-10-18 DIAGNOSIS — I82401 Acute embolism and thrombosis of unspecified deep veins of right lower extremity: Secondary | ICD-10-CM | POA: Diagnosis not present

## 2014-10-18 DIAGNOSIS — Z89512 Acquired absence of left leg below knee: Secondary | ICD-10-CM | POA: Diagnosis not present

## 2014-10-18 DIAGNOSIS — E785 Hyperlipidemia, unspecified: Secondary | ICD-10-CM | POA: Diagnosis not present

## 2014-10-18 DIAGNOSIS — R740 Nonspecific elevation of levels of transaminase and lactic acid dehydrogenase [LDH]: Secondary | ICD-10-CM | POA: Diagnosis not present

## 2014-10-18 DIAGNOSIS — D649 Anemia, unspecified: Secondary | ICD-10-CM | POA: Diagnosis not present

## 2014-10-19 DIAGNOSIS — Z89511 Acquired absence of right leg below knee: Secondary | ICD-10-CM | POA: Diagnosis not present

## 2014-10-19 DIAGNOSIS — I82401 Acute embolism and thrombosis of unspecified deep veins of right lower extremity: Secondary | ICD-10-CM | POA: Diagnosis not present

## 2014-10-19 DIAGNOSIS — N183 Chronic kidney disease, stage 3 (moderate): Secondary | ICD-10-CM | POA: Diagnosis not present

## 2014-10-19 DIAGNOSIS — Z89512 Acquired absence of left leg below knee: Secondary | ICD-10-CM | POA: Diagnosis not present

## 2014-10-19 DIAGNOSIS — D649 Anemia, unspecified: Secondary | ICD-10-CM | POA: Diagnosis not present

## 2014-10-19 DIAGNOSIS — E1140 Type 2 diabetes mellitus with diabetic neuropathy, unspecified: Secondary | ICD-10-CM | POA: Diagnosis not present

## 2014-10-20 DIAGNOSIS — I82401 Acute embolism and thrombosis of unspecified deep veins of right lower extremity: Secondary | ICD-10-CM | POA: Diagnosis not present

## 2014-10-20 DIAGNOSIS — Z89511 Acquired absence of right leg below knee: Secondary | ICD-10-CM | POA: Diagnosis not present

## 2014-10-20 DIAGNOSIS — Z89512 Acquired absence of left leg below knee: Secondary | ICD-10-CM | POA: Diagnosis not present

## 2014-10-20 DIAGNOSIS — D649 Anemia, unspecified: Secondary | ICD-10-CM | POA: Diagnosis not present

## 2014-10-20 DIAGNOSIS — N183 Chronic kidney disease, stage 3 (moderate): Secondary | ICD-10-CM | POA: Diagnosis not present

## 2014-10-20 DIAGNOSIS — E1140 Type 2 diabetes mellitus with diabetic neuropathy, unspecified: Secondary | ICD-10-CM | POA: Diagnosis not present

## 2014-10-22 DIAGNOSIS — E1169 Type 2 diabetes mellitus with other specified complication: Secondary | ICD-10-CM | POA: Diagnosis not present

## 2014-10-23 DIAGNOSIS — N183 Chronic kidney disease, stage 3 (moderate): Secondary | ICD-10-CM | POA: Diagnosis not present

## 2014-10-23 DIAGNOSIS — E1140 Type 2 diabetes mellitus with diabetic neuropathy, unspecified: Secondary | ICD-10-CM | POA: Diagnosis not present

## 2014-10-23 DIAGNOSIS — D649 Anemia, unspecified: Secondary | ICD-10-CM | POA: Diagnosis not present

## 2014-10-23 DIAGNOSIS — I82401 Acute embolism and thrombosis of unspecified deep veins of right lower extremity: Secondary | ICD-10-CM | POA: Diagnosis not present

## 2014-10-23 DIAGNOSIS — Z89511 Acquired absence of right leg below knee: Secondary | ICD-10-CM | POA: Diagnosis not present

## 2014-10-23 DIAGNOSIS — Z89512 Acquired absence of left leg below knee: Secondary | ICD-10-CM | POA: Diagnosis not present

## 2014-10-24 DIAGNOSIS — Z89511 Acquired absence of right leg below knee: Secondary | ICD-10-CM | POA: Diagnosis not present

## 2014-10-24 DIAGNOSIS — D649 Anemia, unspecified: Secondary | ICD-10-CM | POA: Diagnosis not present

## 2014-10-24 DIAGNOSIS — E1140 Type 2 diabetes mellitus with diabetic neuropathy, unspecified: Secondary | ICD-10-CM | POA: Diagnosis not present

## 2014-10-24 DIAGNOSIS — N183 Chronic kidney disease, stage 3 (moderate): Secondary | ICD-10-CM | POA: Diagnosis not present

## 2014-10-24 DIAGNOSIS — I82401 Acute embolism and thrombosis of unspecified deep veins of right lower extremity: Secondary | ICD-10-CM | POA: Diagnosis not present

## 2014-10-24 DIAGNOSIS — Z89512 Acquired absence of left leg below knee: Secondary | ICD-10-CM | POA: Diagnosis not present

## 2014-10-25 DIAGNOSIS — N183 Chronic kidney disease, stage 3 (moderate): Secondary | ICD-10-CM | POA: Diagnosis not present

## 2014-10-25 DIAGNOSIS — D649 Anemia, unspecified: Secondary | ICD-10-CM | POA: Diagnosis not present

## 2014-10-25 DIAGNOSIS — E1140 Type 2 diabetes mellitus with diabetic neuropathy, unspecified: Secondary | ICD-10-CM | POA: Diagnosis not present

## 2014-10-25 DIAGNOSIS — I82401 Acute embolism and thrombosis of unspecified deep veins of right lower extremity: Secondary | ICD-10-CM | POA: Diagnosis not present

## 2014-10-25 DIAGNOSIS — Z89512 Acquired absence of left leg below knee: Secondary | ICD-10-CM | POA: Diagnosis not present

## 2014-10-25 DIAGNOSIS — Z89511 Acquired absence of right leg below knee: Secondary | ICD-10-CM | POA: Diagnosis not present

## 2014-10-26 DIAGNOSIS — I82401 Acute embolism and thrombosis of unspecified deep veins of right lower extremity: Secondary | ICD-10-CM | POA: Diagnosis not present

## 2014-10-26 DIAGNOSIS — Z89512 Acquired absence of left leg below knee: Secondary | ICD-10-CM | POA: Diagnosis not present

## 2014-10-26 DIAGNOSIS — Z89511 Acquired absence of right leg below knee: Secondary | ICD-10-CM | POA: Diagnosis not present

## 2014-10-26 DIAGNOSIS — N183 Chronic kidney disease, stage 3 (moderate): Secondary | ICD-10-CM | POA: Diagnosis not present

## 2014-10-26 DIAGNOSIS — E1140 Type 2 diabetes mellitus with diabetic neuropathy, unspecified: Secondary | ICD-10-CM | POA: Diagnosis not present

## 2014-10-26 DIAGNOSIS — D649 Anemia, unspecified: Secondary | ICD-10-CM | POA: Diagnosis not present

## 2014-10-27 DIAGNOSIS — I82401 Acute embolism and thrombosis of unspecified deep veins of right lower extremity: Secondary | ICD-10-CM | POA: Diagnosis not present

## 2014-10-27 DIAGNOSIS — Z89512 Acquired absence of left leg below knee: Secondary | ICD-10-CM | POA: Diagnosis not present

## 2014-10-27 DIAGNOSIS — Z89511 Acquired absence of right leg below knee: Secondary | ICD-10-CM | POA: Diagnosis not present

## 2014-10-27 DIAGNOSIS — N183 Chronic kidney disease, stage 3 (moderate): Secondary | ICD-10-CM | POA: Diagnosis not present

## 2014-10-27 DIAGNOSIS — E1140 Type 2 diabetes mellitus with diabetic neuropathy, unspecified: Secondary | ICD-10-CM | POA: Diagnosis not present

## 2014-10-27 DIAGNOSIS — D649 Anemia, unspecified: Secondary | ICD-10-CM | POA: Diagnosis not present

## 2014-10-28 DIAGNOSIS — Z89511 Acquired absence of right leg below knee: Secondary | ICD-10-CM | POA: Diagnosis not present

## 2014-10-28 DIAGNOSIS — E1140 Type 2 diabetes mellitus with diabetic neuropathy, unspecified: Secondary | ICD-10-CM | POA: Diagnosis not present

## 2014-10-28 DIAGNOSIS — I82401 Acute embolism and thrombosis of unspecified deep veins of right lower extremity: Secondary | ICD-10-CM | POA: Diagnosis not present

## 2014-10-28 DIAGNOSIS — Z89512 Acquired absence of left leg below knee: Secondary | ICD-10-CM | POA: Diagnosis not present

## 2014-10-28 DIAGNOSIS — N183 Chronic kidney disease, stage 3 (moderate): Secondary | ICD-10-CM | POA: Diagnosis not present

## 2014-10-28 DIAGNOSIS — D649 Anemia, unspecified: Secondary | ICD-10-CM | POA: Diagnosis not present

## 2014-10-30 DIAGNOSIS — R279 Unspecified lack of coordination: Secondary | ICD-10-CM | POA: Diagnosis not present

## 2014-10-30 DIAGNOSIS — M6249 Contracture of muscle, multiple sites: Secondary | ICD-10-CM | POA: Diagnosis not present

## 2014-10-30 DIAGNOSIS — M6281 Muscle weakness (generalized): Secondary | ICD-10-CM | POA: Diagnosis not present

## 2014-10-31 DIAGNOSIS — M6281 Muscle weakness (generalized): Secondary | ICD-10-CM | POA: Diagnosis not present

## 2014-10-31 DIAGNOSIS — M6249 Contracture of muscle, multiple sites: Secondary | ICD-10-CM | POA: Diagnosis not present

## 2014-10-31 DIAGNOSIS — R279 Unspecified lack of coordination: Secondary | ICD-10-CM | POA: Diagnosis not present

## 2014-11-01 DIAGNOSIS — T8131XA Disruption of external operation (surgical) wound, not elsewhere classified, initial encounter: Secondary | ICD-10-CM | POA: Diagnosis not present

## 2014-11-01 DIAGNOSIS — L03119 Cellulitis of unspecified part of limb: Secondary | ICD-10-CM | POA: Diagnosis not present

## 2014-11-01 DIAGNOSIS — M6249 Contracture of muscle, multiple sites: Secondary | ICD-10-CM | POA: Diagnosis not present

## 2014-11-01 DIAGNOSIS — R279 Unspecified lack of coordination: Secondary | ICD-10-CM | POA: Diagnosis not present

## 2014-11-01 DIAGNOSIS — M6281 Muscle weakness (generalized): Secondary | ICD-10-CM | POA: Diagnosis not present

## 2014-11-02 DIAGNOSIS — M6249 Contracture of muscle, multiple sites: Secondary | ICD-10-CM | POA: Diagnosis not present

## 2014-11-02 DIAGNOSIS — M6281 Muscle weakness (generalized): Secondary | ICD-10-CM | POA: Diagnosis not present

## 2014-11-02 DIAGNOSIS — R279 Unspecified lack of coordination: Secondary | ICD-10-CM | POA: Diagnosis not present

## 2014-11-03 DIAGNOSIS — M6281 Muscle weakness (generalized): Secondary | ICD-10-CM | POA: Diagnosis not present

## 2014-11-03 DIAGNOSIS — R279 Unspecified lack of coordination: Secondary | ICD-10-CM | POA: Diagnosis not present

## 2014-11-03 DIAGNOSIS — M6249 Contracture of muscle, multiple sites: Secondary | ICD-10-CM | POA: Diagnosis not present

## 2014-11-05 DIAGNOSIS — M6249 Contracture of muscle, multiple sites: Secondary | ICD-10-CM | POA: Diagnosis not present

## 2014-11-05 DIAGNOSIS — M6281 Muscle weakness (generalized): Secondary | ICD-10-CM | POA: Diagnosis not present

## 2014-11-05 DIAGNOSIS — R279 Unspecified lack of coordination: Secondary | ICD-10-CM | POA: Diagnosis not present

## 2014-11-06 DIAGNOSIS — M6249 Contracture of muscle, multiple sites: Secondary | ICD-10-CM | POA: Diagnosis not present

## 2014-11-06 DIAGNOSIS — M6281 Muscle weakness (generalized): Secondary | ICD-10-CM | POA: Diagnosis not present

## 2014-11-06 DIAGNOSIS — R279 Unspecified lack of coordination: Secondary | ICD-10-CM | POA: Diagnosis not present

## 2014-11-07 DIAGNOSIS — M6281 Muscle weakness (generalized): Secondary | ICD-10-CM | POA: Diagnosis not present

## 2014-11-07 DIAGNOSIS — R279 Unspecified lack of coordination: Secondary | ICD-10-CM | POA: Diagnosis not present

## 2014-11-07 DIAGNOSIS — M6249 Contracture of muscle, multiple sites: Secondary | ICD-10-CM | POA: Diagnosis not present

## 2014-11-08 DIAGNOSIS — M6281 Muscle weakness (generalized): Secondary | ICD-10-CM | POA: Diagnosis not present

## 2014-11-08 DIAGNOSIS — R279 Unspecified lack of coordination: Secondary | ICD-10-CM | POA: Diagnosis not present

## 2014-11-08 DIAGNOSIS — M6249 Contracture of muscle, multiple sites: Secondary | ICD-10-CM | POA: Diagnosis not present

## 2014-11-09 DIAGNOSIS — M6281 Muscle weakness (generalized): Secondary | ICD-10-CM | POA: Diagnosis not present

## 2014-11-09 DIAGNOSIS — M6249 Contracture of muscle, multiple sites: Secondary | ICD-10-CM | POA: Diagnosis not present

## 2014-11-09 DIAGNOSIS — R279 Unspecified lack of coordination: Secondary | ICD-10-CM | POA: Diagnosis not present

## 2014-11-10 DIAGNOSIS — M6281 Muscle weakness (generalized): Secondary | ICD-10-CM | POA: Diagnosis not present

## 2014-11-10 DIAGNOSIS — M6249 Contracture of muscle, multiple sites: Secondary | ICD-10-CM | POA: Diagnosis not present

## 2014-11-10 DIAGNOSIS — R279 Unspecified lack of coordination: Secondary | ICD-10-CM | POA: Diagnosis not present

## 2014-11-12 DIAGNOSIS — R279 Unspecified lack of coordination: Secondary | ICD-10-CM | POA: Diagnosis not present

## 2014-11-12 DIAGNOSIS — M6249 Contracture of muscle, multiple sites: Secondary | ICD-10-CM | POA: Diagnosis not present

## 2014-11-12 DIAGNOSIS — M6281 Muscle weakness (generalized): Secondary | ICD-10-CM | POA: Diagnosis not present

## 2014-11-13 DIAGNOSIS — M6249 Contracture of muscle, multiple sites: Secondary | ICD-10-CM | POA: Diagnosis not present

## 2014-11-13 DIAGNOSIS — M6281 Muscle weakness (generalized): Secondary | ICD-10-CM | POA: Diagnosis not present

## 2014-11-13 DIAGNOSIS — R279 Unspecified lack of coordination: Secondary | ICD-10-CM | POA: Diagnosis not present

## 2014-11-14 DIAGNOSIS — M6281 Muscle weakness (generalized): Secondary | ICD-10-CM | POA: Diagnosis not present

## 2014-11-14 DIAGNOSIS — M6249 Contracture of muscle, multiple sites: Secondary | ICD-10-CM | POA: Diagnosis not present

## 2014-11-14 DIAGNOSIS — R279 Unspecified lack of coordination: Secondary | ICD-10-CM | POA: Diagnosis not present

## 2014-11-15 DIAGNOSIS — M6249 Contracture of muscle, multiple sites: Secondary | ICD-10-CM | POA: Diagnosis not present

## 2014-11-15 DIAGNOSIS — M6281 Muscle weakness (generalized): Secondary | ICD-10-CM | POA: Diagnosis not present

## 2014-11-15 DIAGNOSIS — R279 Unspecified lack of coordination: Secondary | ICD-10-CM | POA: Diagnosis not present

## 2014-11-16 DIAGNOSIS — R279 Unspecified lack of coordination: Secondary | ICD-10-CM | POA: Diagnosis not present

## 2014-11-16 DIAGNOSIS — M6281 Muscle weakness (generalized): Secondary | ICD-10-CM | POA: Diagnosis not present

## 2014-11-16 DIAGNOSIS — M6249 Contracture of muscle, multiple sites: Secondary | ICD-10-CM | POA: Diagnosis not present

## 2014-11-17 DIAGNOSIS — M6249 Contracture of muscle, multiple sites: Secondary | ICD-10-CM | POA: Diagnosis not present

## 2014-11-17 DIAGNOSIS — M6281 Muscle weakness (generalized): Secondary | ICD-10-CM | POA: Diagnosis not present

## 2014-11-17 DIAGNOSIS — R279 Unspecified lack of coordination: Secondary | ICD-10-CM | POA: Diagnosis not present

## 2014-11-20 DIAGNOSIS — M6281 Muscle weakness (generalized): Secondary | ICD-10-CM | POA: Diagnosis not present

## 2014-11-20 DIAGNOSIS — H43813 Vitreous degeneration, bilateral: Secondary | ICD-10-CM | POA: Diagnosis not present

## 2014-11-20 DIAGNOSIS — M6249 Contracture of muscle, multiple sites: Secondary | ICD-10-CM | POA: Diagnosis not present

## 2014-11-20 DIAGNOSIS — H2513 Age-related nuclear cataract, bilateral: Secondary | ICD-10-CM | POA: Diagnosis not present

## 2014-11-20 DIAGNOSIS — E11359 Type 2 diabetes mellitus with proliferative diabetic retinopathy without macular edema: Secondary | ICD-10-CM | POA: Diagnosis not present

## 2014-11-20 DIAGNOSIS — R279 Unspecified lack of coordination: Secondary | ICD-10-CM | POA: Diagnosis not present

## 2014-11-21 DIAGNOSIS — R279 Unspecified lack of coordination: Secondary | ICD-10-CM | POA: Diagnosis not present

## 2014-11-21 DIAGNOSIS — M6249 Contracture of muscle, multiple sites: Secondary | ICD-10-CM | POA: Diagnosis not present

## 2014-11-21 DIAGNOSIS — M6281 Muscle weakness (generalized): Secondary | ICD-10-CM | POA: Diagnosis not present

## 2014-11-22 DIAGNOSIS — M6281 Muscle weakness (generalized): Secondary | ICD-10-CM | POA: Diagnosis not present

## 2014-11-22 DIAGNOSIS — R279 Unspecified lack of coordination: Secondary | ICD-10-CM | POA: Diagnosis not present

## 2014-11-22 DIAGNOSIS — M6249 Contracture of muscle, multiple sites: Secondary | ICD-10-CM | POA: Diagnosis not present

## 2014-11-23 DIAGNOSIS — M6249 Contracture of muscle, multiple sites: Secondary | ICD-10-CM | POA: Diagnosis not present

## 2014-11-23 DIAGNOSIS — R279 Unspecified lack of coordination: Secondary | ICD-10-CM | POA: Diagnosis not present

## 2014-11-23 DIAGNOSIS — M6281 Muscle weakness (generalized): Secondary | ICD-10-CM | POA: Diagnosis not present

## 2014-11-24 DIAGNOSIS — M6249 Contracture of muscle, multiple sites: Secondary | ICD-10-CM | POA: Diagnosis not present

## 2014-11-24 DIAGNOSIS — M6281 Muscle weakness (generalized): Secondary | ICD-10-CM | POA: Diagnosis not present

## 2014-11-24 DIAGNOSIS — R279 Unspecified lack of coordination: Secondary | ICD-10-CM | POA: Diagnosis not present

## 2014-11-26 DIAGNOSIS — M6281 Muscle weakness (generalized): Secondary | ICD-10-CM | POA: Diagnosis not present

## 2014-11-26 DIAGNOSIS — M6249 Contracture of muscle, multiple sites: Secondary | ICD-10-CM | POA: Diagnosis not present

## 2014-11-26 DIAGNOSIS — R279 Unspecified lack of coordination: Secondary | ICD-10-CM | POA: Diagnosis not present

## 2014-11-27 DIAGNOSIS — R279 Unspecified lack of coordination: Secondary | ICD-10-CM | POA: Diagnosis not present

## 2014-11-27 DIAGNOSIS — M6249 Contracture of muscle, multiple sites: Secondary | ICD-10-CM | POA: Diagnosis not present

## 2014-11-27 DIAGNOSIS — R197 Diarrhea, unspecified: Secondary | ICD-10-CM | POA: Diagnosis not present

## 2014-11-27 DIAGNOSIS — M6281 Muscle weakness (generalized): Secondary | ICD-10-CM | POA: Diagnosis not present

## 2014-11-28 DIAGNOSIS — M6249 Contracture of muscle, multiple sites: Secondary | ICD-10-CM | POA: Diagnosis not present

## 2014-11-28 DIAGNOSIS — R279 Unspecified lack of coordination: Secondary | ICD-10-CM | POA: Diagnosis not present

## 2014-11-28 DIAGNOSIS — M6281 Muscle weakness (generalized): Secondary | ICD-10-CM | POA: Diagnosis not present

## 2014-11-29 DIAGNOSIS — R279 Unspecified lack of coordination: Secondary | ICD-10-CM | POA: Diagnosis not present

## 2014-11-29 DIAGNOSIS — M6281 Muscle weakness (generalized): Secondary | ICD-10-CM | POA: Diagnosis not present

## 2014-11-29 DIAGNOSIS — M6249 Contracture of muscle, multiple sites: Secondary | ICD-10-CM | POA: Diagnosis not present

## 2014-11-30 DIAGNOSIS — R279 Unspecified lack of coordination: Secondary | ICD-10-CM | POA: Diagnosis not present

## 2014-11-30 DIAGNOSIS — M6249 Contracture of muscle, multiple sites: Secondary | ICD-10-CM | POA: Diagnosis not present

## 2014-11-30 DIAGNOSIS — M6281 Muscle weakness (generalized): Secondary | ICD-10-CM | POA: Diagnosis not present

## 2014-12-01 DIAGNOSIS — M6249 Contracture of muscle, multiple sites: Secondary | ICD-10-CM | POA: Diagnosis not present

## 2014-12-01 DIAGNOSIS — M6281 Muscle weakness (generalized): Secondary | ICD-10-CM | POA: Diagnosis not present

## 2014-12-01 DIAGNOSIS — R279 Unspecified lack of coordination: Secondary | ICD-10-CM | POA: Diagnosis not present

## 2014-12-04 DIAGNOSIS — M6249 Contracture of muscle, multiple sites: Secondary | ICD-10-CM | POA: Diagnosis not present

## 2014-12-04 DIAGNOSIS — M6281 Muscle weakness (generalized): Secondary | ICD-10-CM | POA: Diagnosis not present

## 2014-12-04 DIAGNOSIS — R279 Unspecified lack of coordination: Secondary | ICD-10-CM | POA: Diagnosis not present

## 2014-12-05 DIAGNOSIS — M6281 Muscle weakness (generalized): Secondary | ICD-10-CM | POA: Diagnosis not present

## 2014-12-05 DIAGNOSIS — R279 Unspecified lack of coordination: Secondary | ICD-10-CM | POA: Diagnosis not present

## 2014-12-05 DIAGNOSIS — M6249 Contracture of muscle, multiple sites: Secondary | ICD-10-CM | POA: Diagnosis not present

## 2014-12-06 DIAGNOSIS — M6249 Contracture of muscle, multiple sites: Secondary | ICD-10-CM | POA: Diagnosis not present

## 2014-12-06 DIAGNOSIS — R279 Unspecified lack of coordination: Secondary | ICD-10-CM | POA: Diagnosis not present

## 2014-12-06 DIAGNOSIS — M6281 Muscle weakness (generalized): Secondary | ICD-10-CM | POA: Diagnosis not present

## 2014-12-07 DIAGNOSIS — M6281 Muscle weakness (generalized): Secondary | ICD-10-CM | POA: Diagnosis not present

## 2014-12-07 DIAGNOSIS — R279 Unspecified lack of coordination: Secondary | ICD-10-CM | POA: Diagnosis not present

## 2014-12-07 DIAGNOSIS — M6249 Contracture of muscle, multiple sites: Secondary | ICD-10-CM | POA: Diagnosis not present

## 2014-12-08 DIAGNOSIS — D649 Anemia, unspecified: Secondary | ICD-10-CM | POA: Diagnosis not present

## 2014-12-08 DIAGNOSIS — R279 Unspecified lack of coordination: Secondary | ICD-10-CM | POA: Diagnosis not present

## 2014-12-08 DIAGNOSIS — M6249 Contracture of muscle, multiple sites: Secondary | ICD-10-CM | POA: Diagnosis not present

## 2014-12-08 DIAGNOSIS — M6281 Muscle weakness (generalized): Secondary | ICD-10-CM | POA: Diagnosis not present

## 2014-12-09 DIAGNOSIS — M6281 Muscle weakness (generalized): Secondary | ICD-10-CM | POA: Diagnosis not present

## 2014-12-09 DIAGNOSIS — M6249 Contracture of muscle, multiple sites: Secondary | ICD-10-CM | POA: Diagnosis not present

## 2014-12-09 DIAGNOSIS — R279 Unspecified lack of coordination: Secondary | ICD-10-CM | POA: Diagnosis not present

## 2014-12-11 DIAGNOSIS — M6249 Contracture of muscle, multiple sites: Secondary | ICD-10-CM | POA: Diagnosis not present

## 2014-12-11 DIAGNOSIS — M6281 Muscle weakness (generalized): Secondary | ICD-10-CM | POA: Diagnosis not present

## 2014-12-11 DIAGNOSIS — R279 Unspecified lack of coordination: Secondary | ICD-10-CM | POA: Diagnosis not present

## 2014-12-12 DIAGNOSIS — M6249 Contracture of muscle, multiple sites: Secondary | ICD-10-CM | POA: Diagnosis not present

## 2014-12-12 DIAGNOSIS — M6281 Muscle weakness (generalized): Secondary | ICD-10-CM | POA: Diagnosis not present

## 2014-12-12 DIAGNOSIS — R279 Unspecified lack of coordination: Secondary | ICD-10-CM | POA: Diagnosis not present

## 2014-12-13 DIAGNOSIS — M6281 Muscle weakness (generalized): Secondary | ICD-10-CM | POA: Diagnosis not present

## 2014-12-13 DIAGNOSIS — M6249 Contracture of muscle, multiple sites: Secondary | ICD-10-CM | POA: Diagnosis not present

## 2014-12-13 DIAGNOSIS — R279 Unspecified lack of coordination: Secondary | ICD-10-CM | POA: Diagnosis not present

## 2014-12-14 DIAGNOSIS — M6249 Contracture of muscle, multiple sites: Secondary | ICD-10-CM | POA: Diagnosis not present

## 2014-12-14 DIAGNOSIS — R279 Unspecified lack of coordination: Secondary | ICD-10-CM | POA: Diagnosis not present

## 2014-12-14 DIAGNOSIS — M6281 Muscle weakness (generalized): Secondary | ICD-10-CM | POA: Diagnosis not present

## 2014-12-15 DIAGNOSIS — R279 Unspecified lack of coordination: Secondary | ICD-10-CM | POA: Diagnosis not present

## 2014-12-15 DIAGNOSIS — M6281 Muscle weakness (generalized): Secondary | ICD-10-CM | POA: Diagnosis not present

## 2014-12-15 DIAGNOSIS — M6249 Contracture of muscle, multiple sites: Secondary | ICD-10-CM | POA: Diagnosis not present

## 2014-12-17 DIAGNOSIS — E1169 Type 2 diabetes mellitus with other specified complication: Secondary | ICD-10-CM | POA: Diagnosis not present

## 2014-12-18 DIAGNOSIS — R279 Unspecified lack of coordination: Secondary | ICD-10-CM | POA: Diagnosis not present

## 2014-12-18 DIAGNOSIS — M6249 Contracture of muscle, multiple sites: Secondary | ICD-10-CM | POA: Diagnosis not present

## 2014-12-18 DIAGNOSIS — M6281 Muscle weakness (generalized): Secondary | ICD-10-CM | POA: Diagnosis not present

## 2014-12-19 DIAGNOSIS — R279 Unspecified lack of coordination: Secondary | ICD-10-CM | POA: Diagnosis not present

## 2014-12-19 DIAGNOSIS — M6281 Muscle weakness (generalized): Secondary | ICD-10-CM | POA: Diagnosis not present

## 2014-12-19 DIAGNOSIS — M6249 Contracture of muscle, multiple sites: Secondary | ICD-10-CM | POA: Diagnosis not present

## 2014-12-20 DIAGNOSIS — M6249 Contracture of muscle, multiple sites: Secondary | ICD-10-CM | POA: Diagnosis not present

## 2014-12-20 DIAGNOSIS — R279 Unspecified lack of coordination: Secondary | ICD-10-CM | POA: Diagnosis not present

## 2014-12-20 DIAGNOSIS — M6281 Muscle weakness (generalized): Secondary | ICD-10-CM | POA: Diagnosis not present

## 2014-12-21 DIAGNOSIS — R279 Unspecified lack of coordination: Secondary | ICD-10-CM | POA: Diagnosis not present

## 2014-12-21 DIAGNOSIS — M6281 Muscle weakness (generalized): Secondary | ICD-10-CM | POA: Diagnosis not present

## 2014-12-21 DIAGNOSIS — M6249 Contracture of muscle, multiple sites: Secondary | ICD-10-CM | POA: Diagnosis not present

## 2014-12-22 DIAGNOSIS — M6249 Contracture of muscle, multiple sites: Secondary | ICD-10-CM | POA: Diagnosis not present

## 2014-12-22 DIAGNOSIS — R279 Unspecified lack of coordination: Secondary | ICD-10-CM | POA: Diagnosis not present

## 2014-12-22 DIAGNOSIS — M6281 Muscle weakness (generalized): Secondary | ICD-10-CM | POA: Diagnosis not present

## 2014-12-25 DIAGNOSIS — R279 Unspecified lack of coordination: Secondary | ICD-10-CM | POA: Diagnosis not present

## 2014-12-25 DIAGNOSIS — M6281 Muscle weakness (generalized): Secondary | ICD-10-CM | POA: Diagnosis not present

## 2014-12-25 DIAGNOSIS — M6249 Contracture of muscle, multiple sites: Secondary | ICD-10-CM | POA: Diagnosis not present

## 2014-12-26 DIAGNOSIS — M6281 Muscle weakness (generalized): Secondary | ICD-10-CM | POA: Diagnosis not present

## 2014-12-26 DIAGNOSIS — M6249 Contracture of muscle, multiple sites: Secondary | ICD-10-CM | POA: Diagnosis not present

## 2014-12-26 DIAGNOSIS — R279 Unspecified lack of coordination: Secondary | ICD-10-CM | POA: Diagnosis not present

## 2014-12-27 DIAGNOSIS — M6281 Muscle weakness (generalized): Secondary | ICD-10-CM | POA: Diagnosis not present

## 2014-12-27 DIAGNOSIS — M6249 Contracture of muscle, multiple sites: Secondary | ICD-10-CM | POA: Diagnosis not present

## 2014-12-27 DIAGNOSIS — R279 Unspecified lack of coordination: Secondary | ICD-10-CM | POA: Diagnosis not present

## 2014-12-28 DIAGNOSIS — M6281 Muscle weakness (generalized): Secondary | ICD-10-CM | POA: Diagnosis not present

## 2014-12-28 DIAGNOSIS — R279 Unspecified lack of coordination: Secondary | ICD-10-CM | POA: Diagnosis not present

## 2014-12-28 DIAGNOSIS — M6249 Contracture of muscle, multiple sites: Secondary | ICD-10-CM | POA: Diagnosis not present

## 2014-12-29 DIAGNOSIS — M6281 Muscle weakness (generalized): Secondary | ICD-10-CM | POA: Diagnosis not present

## 2014-12-29 DIAGNOSIS — M6249 Contracture of muscle, multiple sites: Secondary | ICD-10-CM | POA: Diagnosis not present

## 2014-12-29 DIAGNOSIS — R279 Unspecified lack of coordination: Secondary | ICD-10-CM | POA: Diagnosis not present

## 2015-01-01 DIAGNOSIS — M6249 Contracture of muscle, multiple sites: Secondary | ICD-10-CM | POA: Diagnosis not present

## 2015-01-01 DIAGNOSIS — M6281 Muscle weakness (generalized): Secondary | ICD-10-CM | POA: Diagnosis not present

## 2015-01-01 DIAGNOSIS — R279 Unspecified lack of coordination: Secondary | ICD-10-CM | POA: Diagnosis not present

## 2015-01-02 DIAGNOSIS — R279 Unspecified lack of coordination: Secondary | ICD-10-CM | POA: Diagnosis not present

## 2015-01-02 DIAGNOSIS — M6281 Muscle weakness (generalized): Secondary | ICD-10-CM | POA: Diagnosis not present

## 2015-01-02 DIAGNOSIS — M6249 Contracture of muscle, multiple sites: Secondary | ICD-10-CM | POA: Diagnosis not present

## 2015-01-03 DIAGNOSIS — R279 Unspecified lack of coordination: Secondary | ICD-10-CM | POA: Diagnosis not present

## 2015-01-03 DIAGNOSIS — M6281 Muscle weakness (generalized): Secondary | ICD-10-CM | POA: Diagnosis not present

## 2015-01-03 DIAGNOSIS — M6249 Contracture of muscle, multiple sites: Secondary | ICD-10-CM | POA: Diagnosis not present

## 2015-01-04 DIAGNOSIS — M6281 Muscle weakness (generalized): Secondary | ICD-10-CM | POA: Diagnosis not present

## 2015-01-04 DIAGNOSIS — M6249 Contracture of muscle, multiple sites: Secondary | ICD-10-CM | POA: Diagnosis not present

## 2015-01-04 DIAGNOSIS — R279 Unspecified lack of coordination: Secondary | ICD-10-CM | POA: Diagnosis not present

## 2015-01-05 DIAGNOSIS — R279 Unspecified lack of coordination: Secondary | ICD-10-CM | POA: Diagnosis not present

## 2015-01-05 DIAGNOSIS — M6281 Muscle weakness (generalized): Secondary | ICD-10-CM | POA: Diagnosis not present

## 2015-01-05 DIAGNOSIS — M6249 Contracture of muscle, multiple sites: Secondary | ICD-10-CM | POA: Diagnosis not present

## 2015-01-08 DIAGNOSIS — R279 Unspecified lack of coordination: Secondary | ICD-10-CM | POA: Diagnosis not present

## 2015-01-08 DIAGNOSIS — M6281 Muscle weakness (generalized): Secondary | ICD-10-CM | POA: Diagnosis not present

## 2015-01-08 DIAGNOSIS — M6249 Contracture of muscle, multiple sites: Secondary | ICD-10-CM | POA: Diagnosis not present

## 2015-01-09 DIAGNOSIS — M6281 Muscle weakness (generalized): Secondary | ICD-10-CM | POA: Diagnosis not present

## 2015-01-09 DIAGNOSIS — R279 Unspecified lack of coordination: Secondary | ICD-10-CM | POA: Diagnosis not present

## 2015-01-09 DIAGNOSIS — M6249 Contracture of muscle, multiple sites: Secondary | ICD-10-CM | POA: Diagnosis not present

## 2015-01-10 DIAGNOSIS — H2513 Age-related nuclear cataract, bilateral: Secondary | ICD-10-CM | POA: Diagnosis not present

## 2015-01-10 DIAGNOSIS — H538 Other visual disturbances: Secondary | ICD-10-CM | POA: Diagnosis not present

## 2015-01-10 DIAGNOSIS — M6281 Muscle weakness (generalized): Secondary | ICD-10-CM | POA: Diagnosis not present

## 2015-01-10 DIAGNOSIS — R279 Unspecified lack of coordination: Secondary | ICD-10-CM | POA: Diagnosis not present

## 2015-01-10 DIAGNOSIS — M6249 Contracture of muscle, multiple sites: Secondary | ICD-10-CM | POA: Diagnosis not present

## 2015-01-11 DIAGNOSIS — M6281 Muscle weakness (generalized): Secondary | ICD-10-CM | POA: Diagnosis not present

## 2015-01-11 DIAGNOSIS — M6249 Contracture of muscle, multiple sites: Secondary | ICD-10-CM | POA: Diagnosis not present

## 2015-01-11 DIAGNOSIS — R279 Unspecified lack of coordination: Secondary | ICD-10-CM | POA: Diagnosis not present

## 2015-01-11 DIAGNOSIS — Z23 Encounter for immunization: Secondary | ICD-10-CM | POA: Diagnosis not present

## 2015-01-12 DIAGNOSIS — M6249 Contracture of muscle, multiple sites: Secondary | ICD-10-CM | POA: Diagnosis not present

## 2015-01-12 DIAGNOSIS — R279 Unspecified lack of coordination: Secondary | ICD-10-CM | POA: Diagnosis not present

## 2015-01-12 DIAGNOSIS — M6281 Muscle weakness (generalized): Secondary | ICD-10-CM | POA: Diagnosis not present

## 2015-01-15 DIAGNOSIS — M6281 Muscle weakness (generalized): Secondary | ICD-10-CM | POA: Diagnosis not present

## 2015-01-15 DIAGNOSIS — R279 Unspecified lack of coordination: Secondary | ICD-10-CM | POA: Diagnosis not present

## 2015-01-15 DIAGNOSIS — M6249 Contracture of muscle, multiple sites: Secondary | ICD-10-CM | POA: Diagnosis not present

## 2015-01-16 DIAGNOSIS — M6249 Contracture of muscle, multiple sites: Secondary | ICD-10-CM | POA: Diagnosis not present

## 2015-01-16 DIAGNOSIS — R279 Unspecified lack of coordination: Secondary | ICD-10-CM | POA: Diagnosis not present

## 2015-01-16 DIAGNOSIS — M6281 Muscle weakness (generalized): Secondary | ICD-10-CM | POA: Diagnosis not present

## 2015-01-17 DIAGNOSIS — R279 Unspecified lack of coordination: Secondary | ICD-10-CM | POA: Diagnosis not present

## 2015-01-17 DIAGNOSIS — M6249 Contracture of muscle, multiple sites: Secondary | ICD-10-CM | POA: Diagnosis not present

## 2015-01-17 DIAGNOSIS — M6281 Muscle weakness (generalized): Secondary | ICD-10-CM | POA: Diagnosis not present

## 2015-01-18 DIAGNOSIS — M6281 Muscle weakness (generalized): Secondary | ICD-10-CM | POA: Diagnosis not present

## 2015-01-18 DIAGNOSIS — R279 Unspecified lack of coordination: Secondary | ICD-10-CM | POA: Diagnosis not present

## 2015-01-18 DIAGNOSIS — M6249 Contracture of muscle, multiple sites: Secondary | ICD-10-CM | POA: Diagnosis not present

## 2015-01-19 DIAGNOSIS — R279 Unspecified lack of coordination: Secondary | ICD-10-CM | POA: Diagnosis not present

## 2015-01-19 DIAGNOSIS — M6249 Contracture of muscle, multiple sites: Secondary | ICD-10-CM | POA: Diagnosis not present

## 2015-01-19 DIAGNOSIS — M6281 Muscle weakness (generalized): Secondary | ICD-10-CM | POA: Diagnosis not present

## 2015-01-21 DIAGNOSIS — R279 Unspecified lack of coordination: Secondary | ICD-10-CM | POA: Diagnosis not present

## 2015-01-21 DIAGNOSIS — M6281 Muscle weakness (generalized): Secondary | ICD-10-CM | POA: Diagnosis not present

## 2015-01-21 DIAGNOSIS — M6249 Contracture of muscle, multiple sites: Secondary | ICD-10-CM | POA: Diagnosis not present

## 2015-01-22 DIAGNOSIS — R279 Unspecified lack of coordination: Secondary | ICD-10-CM | POA: Diagnosis not present

## 2015-01-22 DIAGNOSIS — M6249 Contracture of muscle, multiple sites: Secondary | ICD-10-CM | POA: Diagnosis not present

## 2015-01-22 DIAGNOSIS — M6281 Muscle weakness (generalized): Secondary | ICD-10-CM | POA: Diagnosis not present

## 2015-01-23 DIAGNOSIS — M6281 Muscle weakness (generalized): Secondary | ICD-10-CM | POA: Diagnosis not present

## 2015-01-23 DIAGNOSIS — M6249 Contracture of muscle, multiple sites: Secondary | ICD-10-CM | POA: Diagnosis not present

## 2015-01-23 DIAGNOSIS — R279 Unspecified lack of coordination: Secondary | ICD-10-CM | POA: Diagnosis not present

## 2015-01-24 DIAGNOSIS — R279 Unspecified lack of coordination: Secondary | ICD-10-CM | POA: Diagnosis not present

## 2015-01-24 DIAGNOSIS — M6281 Muscle weakness (generalized): Secondary | ICD-10-CM | POA: Diagnosis not present

## 2015-01-24 DIAGNOSIS — M6249 Contracture of muscle, multiple sites: Secondary | ICD-10-CM | POA: Diagnosis not present

## 2015-01-25 DIAGNOSIS — M6281 Muscle weakness (generalized): Secondary | ICD-10-CM | POA: Diagnosis not present

## 2015-01-25 DIAGNOSIS — M6249 Contracture of muscle, multiple sites: Secondary | ICD-10-CM | POA: Diagnosis not present

## 2015-01-25 DIAGNOSIS — R279 Unspecified lack of coordination: Secondary | ICD-10-CM | POA: Diagnosis not present

## 2015-01-25 DIAGNOSIS — F329 Major depressive disorder, single episode, unspecified: Secondary | ICD-10-CM | POA: Diagnosis not present

## 2015-01-26 DIAGNOSIS — M6249 Contracture of muscle, multiple sites: Secondary | ICD-10-CM | POA: Diagnosis not present

## 2015-01-26 DIAGNOSIS — R279 Unspecified lack of coordination: Secondary | ICD-10-CM | POA: Diagnosis not present

## 2015-01-26 DIAGNOSIS — M6281 Muscle weakness (generalized): Secondary | ICD-10-CM | POA: Diagnosis not present

## 2015-01-28 DIAGNOSIS — R279 Unspecified lack of coordination: Secondary | ICD-10-CM | POA: Diagnosis not present

## 2015-01-28 DIAGNOSIS — M6281 Muscle weakness (generalized): Secondary | ICD-10-CM | POA: Diagnosis not present

## 2015-01-28 DIAGNOSIS — M6249 Contracture of muscle, multiple sites: Secondary | ICD-10-CM | POA: Diagnosis not present

## 2015-01-29 DIAGNOSIS — R279 Unspecified lack of coordination: Secondary | ICD-10-CM | POA: Diagnosis not present

## 2015-01-29 DIAGNOSIS — M6249 Contracture of muscle, multiple sites: Secondary | ICD-10-CM | POA: Diagnosis not present

## 2015-01-29 DIAGNOSIS — M6281 Muscle weakness (generalized): Secondary | ICD-10-CM | POA: Diagnosis not present

## 2015-01-30 DIAGNOSIS — R279 Unspecified lack of coordination: Secondary | ICD-10-CM | POA: Diagnosis not present

## 2015-01-30 DIAGNOSIS — M6281 Muscle weakness (generalized): Secondary | ICD-10-CM | POA: Diagnosis not present

## 2015-01-30 DIAGNOSIS — M6249 Contracture of muscle, multiple sites: Secondary | ICD-10-CM | POA: Diagnosis not present

## 2015-01-31 DIAGNOSIS — L03119 Cellulitis of unspecified part of limb: Secondary | ICD-10-CM | POA: Diagnosis not present

## 2015-01-31 DIAGNOSIS — M6281 Muscle weakness (generalized): Secondary | ICD-10-CM | POA: Diagnosis not present

## 2015-01-31 DIAGNOSIS — M6249 Contracture of muscle, multiple sites: Secondary | ICD-10-CM | POA: Diagnosis not present

## 2015-01-31 DIAGNOSIS — R279 Unspecified lack of coordination: Secondary | ICD-10-CM | POA: Diagnosis not present

## 2015-01-31 DIAGNOSIS — T8131XA Disruption of external operation (surgical) wound, not elsewhere classified, initial encounter: Secondary | ICD-10-CM | POA: Diagnosis not present

## 2015-01-31 DIAGNOSIS — D649 Anemia, unspecified: Secondary | ICD-10-CM | POA: Diagnosis not present

## 2015-02-01 DIAGNOSIS — R279 Unspecified lack of coordination: Secondary | ICD-10-CM | POA: Diagnosis not present

## 2015-02-01 DIAGNOSIS — M6281 Muscle weakness (generalized): Secondary | ICD-10-CM | POA: Diagnosis not present

## 2015-02-01 DIAGNOSIS — M6249 Contracture of muscle, multiple sites: Secondary | ICD-10-CM | POA: Diagnosis not present

## 2015-02-02 DIAGNOSIS — M6249 Contracture of muscle, multiple sites: Secondary | ICD-10-CM | POA: Diagnosis not present

## 2015-02-02 DIAGNOSIS — M6281 Muscle weakness (generalized): Secondary | ICD-10-CM | POA: Diagnosis not present

## 2015-02-02 DIAGNOSIS — R279 Unspecified lack of coordination: Secondary | ICD-10-CM | POA: Diagnosis not present

## 2015-02-04 DIAGNOSIS — M6281 Muscle weakness (generalized): Secondary | ICD-10-CM | POA: Diagnosis not present

## 2015-02-04 DIAGNOSIS — R279 Unspecified lack of coordination: Secondary | ICD-10-CM | POA: Diagnosis not present

## 2015-02-04 DIAGNOSIS — M6249 Contracture of muscle, multiple sites: Secondary | ICD-10-CM | POA: Diagnosis not present

## 2015-02-05 DIAGNOSIS — R279 Unspecified lack of coordination: Secondary | ICD-10-CM | POA: Diagnosis not present

## 2015-02-05 DIAGNOSIS — M6249 Contracture of muscle, multiple sites: Secondary | ICD-10-CM | POA: Diagnosis not present

## 2015-02-05 DIAGNOSIS — M6281 Muscle weakness (generalized): Secondary | ICD-10-CM | POA: Diagnosis not present

## 2015-02-06 DIAGNOSIS — M6249 Contracture of muscle, multiple sites: Secondary | ICD-10-CM | POA: Diagnosis not present

## 2015-02-06 DIAGNOSIS — M6281 Muscle weakness (generalized): Secondary | ICD-10-CM | POA: Diagnosis not present

## 2015-02-06 DIAGNOSIS — R279 Unspecified lack of coordination: Secondary | ICD-10-CM | POA: Diagnosis not present

## 2015-02-07 DIAGNOSIS — R279 Unspecified lack of coordination: Secondary | ICD-10-CM | POA: Diagnosis not present

## 2015-02-07 DIAGNOSIS — M6281 Muscle weakness (generalized): Secondary | ICD-10-CM | POA: Diagnosis not present

## 2015-02-07 DIAGNOSIS — M6249 Contracture of muscle, multiple sites: Secondary | ICD-10-CM | POA: Diagnosis not present

## 2015-02-08 DIAGNOSIS — H2511 Age-related nuclear cataract, right eye: Secondary | ICD-10-CM | POA: Diagnosis not present

## 2015-02-08 DIAGNOSIS — R279 Unspecified lack of coordination: Secondary | ICD-10-CM | POA: Diagnosis not present

## 2015-02-08 DIAGNOSIS — M6281 Muscle weakness (generalized): Secondary | ICD-10-CM | POA: Diagnosis not present

## 2015-02-08 DIAGNOSIS — H538 Other visual disturbances: Secondary | ICD-10-CM | POA: Diagnosis not present

## 2015-02-08 DIAGNOSIS — M6249 Contracture of muscle, multiple sites: Secondary | ICD-10-CM | POA: Diagnosis not present

## 2015-02-09 DIAGNOSIS — M6281 Muscle weakness (generalized): Secondary | ICD-10-CM | POA: Diagnosis not present

## 2015-02-09 DIAGNOSIS — M6249 Contracture of muscle, multiple sites: Secondary | ICD-10-CM | POA: Diagnosis not present

## 2015-02-09 DIAGNOSIS — R279 Unspecified lack of coordination: Secondary | ICD-10-CM | POA: Diagnosis not present

## 2015-02-11 DIAGNOSIS — M6281 Muscle weakness (generalized): Secondary | ICD-10-CM | POA: Diagnosis not present

## 2015-02-11 DIAGNOSIS — E1169 Type 2 diabetes mellitus with other specified complication: Secondary | ICD-10-CM | POA: Diagnosis not present

## 2015-02-11 DIAGNOSIS — R279 Unspecified lack of coordination: Secondary | ICD-10-CM | POA: Diagnosis not present

## 2015-02-11 DIAGNOSIS — M6249 Contracture of muscle, multiple sites: Secondary | ICD-10-CM | POA: Diagnosis not present

## 2015-02-12 DIAGNOSIS — H2511 Age-related nuclear cataract, right eye: Secondary | ICD-10-CM | POA: Diagnosis not present

## 2015-02-12 DIAGNOSIS — Z8673 Personal history of transient ischemic attack (TIA), and cerebral infarction without residual deficits: Secondary | ICD-10-CM | POA: Diagnosis not present

## 2015-02-12 DIAGNOSIS — H524 Presbyopia: Secondary | ICD-10-CM | POA: Diagnosis not present

## 2015-02-12 DIAGNOSIS — M6281 Muscle weakness (generalized): Secondary | ICD-10-CM | POA: Diagnosis not present

## 2015-02-12 DIAGNOSIS — H538 Other visual disturbances: Secondary | ICD-10-CM | POA: Diagnosis not present

## 2015-02-12 DIAGNOSIS — Z794 Long term (current) use of insulin: Secondary | ICD-10-CM | POA: Diagnosis not present

## 2015-02-12 DIAGNOSIS — Z89611 Acquired absence of right leg above knee: Secondary | ICD-10-CM | POA: Diagnosis not present

## 2015-02-12 DIAGNOSIS — Z7951 Long term (current) use of inhaled steroids: Secondary | ICD-10-CM | POA: Diagnosis not present

## 2015-02-12 DIAGNOSIS — H5210 Myopia, unspecified eye: Secondary | ICD-10-CM | POA: Diagnosis not present

## 2015-02-12 DIAGNOSIS — N189 Chronic kidney disease, unspecified: Secondary | ICD-10-CM | POA: Diagnosis not present

## 2015-02-12 DIAGNOSIS — I129 Hypertensive chronic kidney disease with stage 1 through stage 4 chronic kidney disease, or unspecified chronic kidney disease: Secondary | ICD-10-CM | POA: Diagnosis not present

## 2015-02-12 DIAGNOSIS — Z79899 Other long term (current) drug therapy: Secondary | ICD-10-CM | POA: Diagnosis not present

## 2015-02-12 DIAGNOSIS — R279 Unspecified lack of coordination: Secondary | ICD-10-CM | POA: Diagnosis not present

## 2015-02-12 DIAGNOSIS — Z6839 Body mass index (BMI) 39.0-39.9, adult: Secondary | ICD-10-CM | POA: Diagnosis not present

## 2015-02-12 DIAGNOSIS — Z89612 Acquired absence of left leg above knee: Secondary | ICD-10-CM | POA: Diagnosis not present

## 2015-02-12 DIAGNOSIS — E119 Type 2 diabetes mellitus without complications: Secondary | ICD-10-CM | POA: Diagnosis not present

## 2015-02-12 DIAGNOSIS — H269 Unspecified cataract: Secondary | ICD-10-CM | POA: Diagnosis not present

## 2015-02-12 DIAGNOSIS — E669 Obesity, unspecified: Secondary | ICD-10-CM | POA: Diagnosis not present

## 2015-02-12 DIAGNOSIS — M6249 Contracture of muscle, multiple sites: Secondary | ICD-10-CM | POA: Diagnosis not present

## 2015-02-12 DIAGNOSIS — H2513 Age-related nuclear cataract, bilateral: Secondary | ICD-10-CM | POA: Diagnosis not present

## 2015-02-12 DIAGNOSIS — E785 Hyperlipidemia, unspecified: Secondary | ICD-10-CM | POA: Diagnosis not present

## 2015-02-12 DIAGNOSIS — F329 Major depressive disorder, single episode, unspecified: Secondary | ICD-10-CM | POA: Diagnosis not present

## 2015-02-12 DIAGNOSIS — K219 Gastro-esophageal reflux disease without esophagitis: Secondary | ICD-10-CM | POA: Diagnosis not present

## 2015-02-13 DIAGNOSIS — M6281 Muscle weakness (generalized): Secondary | ICD-10-CM | POA: Diagnosis not present

## 2015-02-13 DIAGNOSIS — M6249 Contracture of muscle, multiple sites: Secondary | ICD-10-CM | POA: Diagnosis not present

## 2015-02-13 DIAGNOSIS — R279 Unspecified lack of coordination: Secondary | ICD-10-CM | POA: Diagnosis not present

## 2015-02-14 DIAGNOSIS — R279 Unspecified lack of coordination: Secondary | ICD-10-CM | POA: Diagnosis not present

## 2015-02-14 DIAGNOSIS — M6249 Contracture of muscle, multiple sites: Secondary | ICD-10-CM | POA: Diagnosis not present

## 2015-02-14 DIAGNOSIS — M6281 Muscle weakness (generalized): Secondary | ICD-10-CM | POA: Diagnosis not present

## 2015-02-15 DIAGNOSIS — R279 Unspecified lack of coordination: Secondary | ICD-10-CM | POA: Diagnosis not present

## 2015-02-15 DIAGNOSIS — M6281 Muscle weakness (generalized): Secondary | ICD-10-CM | POA: Diagnosis not present

## 2015-02-15 DIAGNOSIS — M6249 Contracture of muscle, multiple sites: Secondary | ICD-10-CM | POA: Diagnosis not present

## 2015-02-16 DIAGNOSIS — R279 Unspecified lack of coordination: Secondary | ICD-10-CM | POA: Diagnosis not present

## 2015-02-16 DIAGNOSIS — M6281 Muscle weakness (generalized): Secondary | ICD-10-CM | POA: Diagnosis not present

## 2015-02-16 DIAGNOSIS — M6249 Contracture of muscle, multiple sites: Secondary | ICD-10-CM | POA: Diagnosis not present

## 2015-02-18 DIAGNOSIS — M6281 Muscle weakness (generalized): Secondary | ICD-10-CM | POA: Diagnosis not present

## 2015-02-18 DIAGNOSIS — R279 Unspecified lack of coordination: Secondary | ICD-10-CM | POA: Diagnosis not present

## 2015-02-18 DIAGNOSIS — M6249 Contracture of muscle, multiple sites: Secondary | ICD-10-CM | POA: Diagnosis not present

## 2015-02-19 DIAGNOSIS — R279 Unspecified lack of coordination: Secondary | ICD-10-CM | POA: Diagnosis not present

## 2015-02-19 DIAGNOSIS — M6281 Muscle weakness (generalized): Secondary | ICD-10-CM | POA: Diagnosis not present

## 2015-02-19 DIAGNOSIS — M6249 Contracture of muscle, multiple sites: Secondary | ICD-10-CM | POA: Diagnosis not present

## 2015-02-20 DIAGNOSIS — M6249 Contracture of muscle, multiple sites: Secondary | ICD-10-CM | POA: Diagnosis not present

## 2015-02-20 DIAGNOSIS — M6281 Muscle weakness (generalized): Secondary | ICD-10-CM | POA: Diagnosis not present

## 2015-02-20 DIAGNOSIS — R279 Unspecified lack of coordination: Secondary | ICD-10-CM | POA: Diagnosis not present

## 2015-02-21 DIAGNOSIS — M6281 Muscle weakness (generalized): Secondary | ICD-10-CM | POA: Diagnosis not present

## 2015-02-21 DIAGNOSIS — M6249 Contracture of muscle, multiple sites: Secondary | ICD-10-CM | POA: Diagnosis not present

## 2015-02-21 DIAGNOSIS — R279 Unspecified lack of coordination: Secondary | ICD-10-CM | POA: Diagnosis not present

## 2015-02-23 DIAGNOSIS — M6249 Contracture of muscle, multiple sites: Secondary | ICD-10-CM | POA: Diagnosis not present

## 2015-02-23 DIAGNOSIS — R279 Unspecified lack of coordination: Secondary | ICD-10-CM | POA: Diagnosis not present

## 2015-02-23 DIAGNOSIS — M6281 Muscle weakness (generalized): Secondary | ICD-10-CM | POA: Diagnosis not present

## 2015-02-26 DIAGNOSIS — M6249 Contracture of muscle, multiple sites: Secondary | ICD-10-CM | POA: Diagnosis not present

## 2015-02-26 DIAGNOSIS — R279 Unspecified lack of coordination: Secondary | ICD-10-CM | POA: Diagnosis not present

## 2015-02-26 DIAGNOSIS — M6281 Muscle weakness (generalized): Secondary | ICD-10-CM | POA: Diagnosis not present

## 2015-02-27 DIAGNOSIS — M6249 Contracture of muscle, multiple sites: Secondary | ICD-10-CM | POA: Diagnosis not present

## 2015-02-27 DIAGNOSIS — R279 Unspecified lack of coordination: Secondary | ICD-10-CM | POA: Diagnosis not present

## 2015-02-27 DIAGNOSIS — M6281 Muscle weakness (generalized): Secondary | ICD-10-CM | POA: Diagnosis not present

## 2015-02-28 DIAGNOSIS — M6281 Muscle weakness (generalized): Secondary | ICD-10-CM | POA: Diagnosis not present

## 2015-02-28 DIAGNOSIS — R279 Unspecified lack of coordination: Secondary | ICD-10-CM | POA: Diagnosis not present

## 2015-02-28 DIAGNOSIS — M6249 Contracture of muscle, multiple sites: Secondary | ICD-10-CM | POA: Diagnosis not present

## 2015-03-01 DIAGNOSIS — M6249 Contracture of muscle, multiple sites: Secondary | ICD-10-CM | POA: Diagnosis not present

## 2015-03-01 DIAGNOSIS — M6281 Muscle weakness (generalized): Secondary | ICD-10-CM | POA: Diagnosis not present

## 2015-03-01 DIAGNOSIS — R279 Unspecified lack of coordination: Secondary | ICD-10-CM | POA: Diagnosis not present

## 2015-03-02 DIAGNOSIS — R279 Unspecified lack of coordination: Secondary | ICD-10-CM | POA: Diagnosis not present

## 2015-03-02 DIAGNOSIS — M6281 Muscle weakness (generalized): Secondary | ICD-10-CM | POA: Diagnosis not present

## 2015-03-02 DIAGNOSIS — M6249 Contracture of muscle, multiple sites: Secondary | ICD-10-CM | POA: Diagnosis not present

## 2015-03-04 DIAGNOSIS — M6249 Contracture of muscle, multiple sites: Secondary | ICD-10-CM | POA: Diagnosis not present

## 2015-03-04 DIAGNOSIS — M6281 Muscle weakness (generalized): Secondary | ICD-10-CM | POA: Diagnosis not present

## 2015-03-04 DIAGNOSIS — R279 Unspecified lack of coordination: Secondary | ICD-10-CM | POA: Diagnosis not present

## 2015-03-05 DIAGNOSIS — R279 Unspecified lack of coordination: Secondary | ICD-10-CM | POA: Diagnosis not present

## 2015-03-05 DIAGNOSIS — M6249 Contracture of muscle, multiple sites: Secondary | ICD-10-CM | POA: Diagnosis not present

## 2015-03-05 DIAGNOSIS — M6281 Muscle weakness (generalized): Secondary | ICD-10-CM | POA: Diagnosis not present

## 2015-03-06 DIAGNOSIS — M6249 Contracture of muscle, multiple sites: Secondary | ICD-10-CM | POA: Diagnosis not present

## 2015-03-06 DIAGNOSIS — R279 Unspecified lack of coordination: Secondary | ICD-10-CM | POA: Diagnosis not present

## 2015-03-06 DIAGNOSIS — N390 Urinary tract infection, site not specified: Secondary | ICD-10-CM | POA: Diagnosis not present

## 2015-03-06 DIAGNOSIS — M6281 Muscle weakness (generalized): Secondary | ICD-10-CM | POA: Diagnosis not present

## 2015-03-07 DIAGNOSIS — M6281 Muscle weakness (generalized): Secondary | ICD-10-CM | POA: Diagnosis not present

## 2015-03-07 DIAGNOSIS — R279 Unspecified lack of coordination: Secondary | ICD-10-CM | POA: Diagnosis not present

## 2015-03-07 DIAGNOSIS — M6249 Contracture of muscle, multiple sites: Secondary | ICD-10-CM | POA: Diagnosis not present

## 2015-03-08 DIAGNOSIS — M6249 Contracture of muscle, multiple sites: Secondary | ICD-10-CM | POA: Diagnosis not present

## 2015-03-08 DIAGNOSIS — R279 Unspecified lack of coordination: Secondary | ICD-10-CM | POA: Diagnosis not present

## 2015-03-08 DIAGNOSIS — M6281 Muscle weakness (generalized): Secondary | ICD-10-CM | POA: Diagnosis not present

## 2015-03-09 DIAGNOSIS — R279 Unspecified lack of coordination: Secondary | ICD-10-CM | POA: Diagnosis not present

## 2015-03-09 DIAGNOSIS — M6249 Contracture of muscle, multiple sites: Secondary | ICD-10-CM | POA: Diagnosis not present

## 2015-03-09 DIAGNOSIS — M6281 Muscle weakness (generalized): Secondary | ICD-10-CM | POA: Diagnosis not present

## 2015-03-11 DIAGNOSIS — R279 Unspecified lack of coordination: Secondary | ICD-10-CM | POA: Diagnosis not present

## 2015-03-11 DIAGNOSIS — M6281 Muscle weakness (generalized): Secondary | ICD-10-CM | POA: Diagnosis not present

## 2015-03-11 DIAGNOSIS — M6249 Contracture of muscle, multiple sites: Secondary | ICD-10-CM | POA: Diagnosis not present

## 2015-03-12 DIAGNOSIS — R279 Unspecified lack of coordination: Secondary | ICD-10-CM | POA: Diagnosis not present

## 2015-03-12 DIAGNOSIS — M6281 Muscle weakness (generalized): Secondary | ICD-10-CM | POA: Diagnosis not present

## 2015-03-12 DIAGNOSIS — F329 Major depressive disorder, single episode, unspecified: Secondary | ICD-10-CM | POA: Diagnosis not present

## 2015-03-12 DIAGNOSIS — M6249 Contracture of muscle, multiple sites: Secondary | ICD-10-CM | POA: Diagnosis not present

## 2015-03-13 DIAGNOSIS — M6281 Muscle weakness (generalized): Secondary | ICD-10-CM | POA: Diagnosis not present

## 2015-03-13 DIAGNOSIS — R279 Unspecified lack of coordination: Secondary | ICD-10-CM | POA: Diagnosis not present

## 2015-03-13 DIAGNOSIS — M6249 Contracture of muscle, multiple sites: Secondary | ICD-10-CM | POA: Diagnosis not present

## 2015-03-14 DIAGNOSIS — R279 Unspecified lack of coordination: Secondary | ICD-10-CM | POA: Diagnosis not present

## 2015-03-14 DIAGNOSIS — M6281 Muscle weakness (generalized): Secondary | ICD-10-CM | POA: Diagnosis not present

## 2015-03-14 DIAGNOSIS — M6249 Contracture of muscle, multiple sites: Secondary | ICD-10-CM | POA: Diagnosis not present

## 2015-03-14 DIAGNOSIS — E559 Vitamin D deficiency, unspecified: Secondary | ICD-10-CM | POA: Diagnosis not present

## 2015-03-14 DIAGNOSIS — E039 Hypothyroidism, unspecified: Secondary | ICD-10-CM | POA: Diagnosis not present

## 2015-03-15 DIAGNOSIS — R279 Unspecified lack of coordination: Secondary | ICD-10-CM | POA: Diagnosis not present

## 2015-03-15 DIAGNOSIS — M6249 Contracture of muscle, multiple sites: Secondary | ICD-10-CM | POA: Diagnosis not present

## 2015-03-15 DIAGNOSIS — M6281 Muscle weakness (generalized): Secondary | ICD-10-CM | POA: Diagnosis not present

## 2015-03-16 DIAGNOSIS — M6249 Contracture of muscle, multiple sites: Secondary | ICD-10-CM | POA: Diagnosis not present

## 2015-03-16 DIAGNOSIS — M6281 Muscle weakness (generalized): Secondary | ICD-10-CM | POA: Diagnosis not present

## 2015-03-16 DIAGNOSIS — F329 Major depressive disorder, single episode, unspecified: Secondary | ICD-10-CM | POA: Diagnosis not present

## 2015-03-16 DIAGNOSIS — R279 Unspecified lack of coordination: Secondary | ICD-10-CM | POA: Diagnosis not present

## 2015-03-18 DIAGNOSIS — M6249 Contracture of muscle, multiple sites: Secondary | ICD-10-CM | POA: Diagnosis not present

## 2015-03-18 DIAGNOSIS — R279 Unspecified lack of coordination: Secondary | ICD-10-CM | POA: Diagnosis not present

## 2015-03-18 DIAGNOSIS — M6281 Muscle weakness (generalized): Secondary | ICD-10-CM | POA: Diagnosis not present

## 2015-03-19 DIAGNOSIS — M6249 Contracture of muscle, multiple sites: Secondary | ICD-10-CM | POA: Diagnosis not present

## 2015-03-19 DIAGNOSIS — R279 Unspecified lack of coordination: Secondary | ICD-10-CM | POA: Diagnosis not present

## 2015-03-19 DIAGNOSIS — M6281 Muscle weakness (generalized): Secondary | ICD-10-CM | POA: Diagnosis not present

## 2015-03-20 DIAGNOSIS — M6249 Contracture of muscle, multiple sites: Secondary | ICD-10-CM | POA: Diagnosis not present

## 2015-03-20 DIAGNOSIS — M6281 Muscle weakness (generalized): Secondary | ICD-10-CM | POA: Diagnosis not present

## 2015-03-20 DIAGNOSIS — R279 Unspecified lack of coordination: Secondary | ICD-10-CM | POA: Diagnosis not present

## 2015-03-21 DIAGNOSIS — M6249 Contracture of muscle, multiple sites: Secondary | ICD-10-CM | POA: Diagnosis not present

## 2015-03-21 DIAGNOSIS — R279 Unspecified lack of coordination: Secondary | ICD-10-CM | POA: Diagnosis not present

## 2015-03-21 DIAGNOSIS — M6281 Muscle weakness (generalized): Secondary | ICD-10-CM | POA: Diagnosis not present

## 2015-03-22 DIAGNOSIS — R279 Unspecified lack of coordination: Secondary | ICD-10-CM | POA: Diagnosis not present

## 2015-03-22 DIAGNOSIS — M6281 Muscle weakness (generalized): Secondary | ICD-10-CM | POA: Diagnosis not present

## 2015-03-22 DIAGNOSIS — N390 Urinary tract infection, site not specified: Secondary | ICD-10-CM | POA: Diagnosis not present

## 2015-03-22 DIAGNOSIS — M6249 Contracture of muscle, multiple sites: Secondary | ICD-10-CM | POA: Diagnosis not present

## 2015-03-23 DIAGNOSIS — M6281 Muscle weakness (generalized): Secondary | ICD-10-CM | POA: Diagnosis not present

## 2015-03-23 DIAGNOSIS — R279 Unspecified lack of coordination: Secondary | ICD-10-CM | POA: Diagnosis not present

## 2015-03-23 DIAGNOSIS — M6249 Contracture of muscle, multiple sites: Secondary | ICD-10-CM | POA: Diagnosis not present

## 2015-03-27 DIAGNOSIS — R279 Unspecified lack of coordination: Secondary | ICD-10-CM | POA: Diagnosis not present

## 2015-03-27 DIAGNOSIS — M6249 Contracture of muscle, multiple sites: Secondary | ICD-10-CM | POA: Diagnosis not present

## 2015-03-27 DIAGNOSIS — M6281 Muscle weakness (generalized): Secondary | ICD-10-CM | POA: Diagnosis not present

## 2015-03-28 DIAGNOSIS — M6249 Contracture of muscle, multiple sites: Secondary | ICD-10-CM | POA: Diagnosis not present

## 2015-03-28 DIAGNOSIS — M6281 Muscle weakness (generalized): Secondary | ICD-10-CM | POA: Diagnosis not present

## 2015-03-28 DIAGNOSIS — R279 Unspecified lack of coordination: Secondary | ICD-10-CM | POA: Diagnosis not present

## 2015-03-29 DIAGNOSIS — R279 Unspecified lack of coordination: Secondary | ICD-10-CM | POA: Diagnosis not present

## 2015-03-29 DIAGNOSIS — M6281 Muscle weakness (generalized): Secondary | ICD-10-CM | POA: Diagnosis not present

## 2015-03-29 DIAGNOSIS — M6249 Contracture of muscle, multiple sites: Secondary | ICD-10-CM | POA: Diagnosis not present

## 2015-03-30 DIAGNOSIS — M6249 Contracture of muscle, multiple sites: Secondary | ICD-10-CM | POA: Diagnosis not present

## 2015-03-30 DIAGNOSIS — M6281 Muscle weakness (generalized): Secondary | ICD-10-CM | POA: Diagnosis not present

## 2015-03-30 DIAGNOSIS — R279 Unspecified lack of coordination: Secondary | ICD-10-CM | POA: Diagnosis not present

## 2015-03-31 DIAGNOSIS — M6249 Contracture of muscle, multiple sites: Secondary | ICD-10-CM | POA: Diagnosis not present

## 2015-03-31 DIAGNOSIS — M6281 Muscle weakness (generalized): Secondary | ICD-10-CM | POA: Diagnosis not present

## 2015-03-31 DIAGNOSIS — R279 Unspecified lack of coordination: Secondary | ICD-10-CM | POA: Diagnosis not present

## 2015-04-02 DIAGNOSIS — M6249 Contracture of muscle, multiple sites: Secondary | ICD-10-CM | POA: Diagnosis not present

## 2015-04-02 DIAGNOSIS — M6281 Muscle weakness (generalized): Secondary | ICD-10-CM | POA: Diagnosis not present

## 2015-04-02 DIAGNOSIS — R279 Unspecified lack of coordination: Secondary | ICD-10-CM | POA: Diagnosis not present

## 2015-04-03 DIAGNOSIS — M6281 Muscle weakness (generalized): Secondary | ICD-10-CM | POA: Diagnosis not present

## 2015-04-03 DIAGNOSIS — M6249 Contracture of muscle, multiple sites: Secondary | ICD-10-CM | POA: Diagnosis not present

## 2015-04-03 DIAGNOSIS — R279 Unspecified lack of coordination: Secondary | ICD-10-CM | POA: Diagnosis not present

## 2015-04-04 DIAGNOSIS — R279 Unspecified lack of coordination: Secondary | ICD-10-CM | POA: Diagnosis not present

## 2015-04-04 DIAGNOSIS — M6281 Muscle weakness (generalized): Secondary | ICD-10-CM | POA: Diagnosis not present

## 2015-04-04 DIAGNOSIS — M6249 Contracture of muscle, multiple sites: Secondary | ICD-10-CM | POA: Diagnosis not present

## 2015-04-05 DIAGNOSIS — R279 Unspecified lack of coordination: Secondary | ICD-10-CM | POA: Diagnosis not present

## 2015-04-05 DIAGNOSIS — M6281 Muscle weakness (generalized): Secondary | ICD-10-CM | POA: Diagnosis not present

## 2015-04-05 DIAGNOSIS — M6249 Contracture of muscle, multiple sites: Secondary | ICD-10-CM | POA: Diagnosis not present

## 2015-04-06 DIAGNOSIS — R279 Unspecified lack of coordination: Secondary | ICD-10-CM | POA: Diagnosis not present

## 2015-04-06 DIAGNOSIS — M6249 Contracture of muscle, multiple sites: Secondary | ICD-10-CM | POA: Diagnosis not present

## 2015-04-06 DIAGNOSIS — M6281 Muscle weakness (generalized): Secondary | ICD-10-CM | POA: Diagnosis not present

## 2015-04-08 DIAGNOSIS — L308 Other specified dermatitis: Secondary | ICD-10-CM | POA: Diagnosis not present

## 2015-04-20 DIAGNOSIS — E119 Type 2 diabetes mellitus without complications: Secondary | ICD-10-CM | POA: Diagnosis not present

## 2015-04-20 DIAGNOSIS — R740 Nonspecific elevation of levels of transaminase and lactic acid dehydrogenase [LDH]: Secondary | ICD-10-CM | POA: Diagnosis not present

## 2015-04-20 DIAGNOSIS — E785 Hyperlipidemia, unspecified: Secondary | ICD-10-CM | POA: Diagnosis not present

## 2015-04-20 DIAGNOSIS — D649 Anemia, unspecified: Secondary | ICD-10-CM | POA: Diagnosis not present

## 2015-04-23 DIAGNOSIS — H43391 Other vitreous opacities, right eye: Secondary | ICD-10-CM | POA: Diagnosis not present

## 2015-05-14 DIAGNOSIS — E1152 Type 2 diabetes mellitus with diabetic peripheral angiopathy with gangrene: Secondary | ICD-10-CM | POA: Diagnosis not present

## 2015-05-31 DIAGNOSIS — F329 Major depressive disorder, single episode, unspecified: Secondary | ICD-10-CM | POA: Diagnosis not present

## 2015-06-02 DIAGNOSIS — E1169 Type 2 diabetes mellitus with other specified complication: Secondary | ICD-10-CM | POA: Diagnosis not present

## 2015-06-04 DIAGNOSIS — H43813 Vitreous degeneration, bilateral: Secondary | ICD-10-CM | POA: Diagnosis not present

## 2015-06-04 DIAGNOSIS — H35043 Retinal micro-aneurysms, unspecified, bilateral: Secondary | ICD-10-CM | POA: Diagnosis not present

## 2015-06-04 DIAGNOSIS — E113553 Type 2 diabetes mellitus with stable proliferative diabetic retinopathy, bilateral: Secondary | ICD-10-CM | POA: Diagnosis not present

## 2015-06-04 DIAGNOSIS — H2512 Age-related nuclear cataract, left eye: Secondary | ICD-10-CM | POA: Diagnosis not present

## 2015-06-19 DIAGNOSIS — F329 Major depressive disorder, single episode, unspecified: Secondary | ICD-10-CM | POA: Diagnosis not present

## 2015-06-27 DIAGNOSIS — N390 Urinary tract infection, site not specified: Secondary | ICD-10-CM | POA: Diagnosis not present

## 2015-06-29 DIAGNOSIS — I10 Essential (primary) hypertension: Secondary | ICD-10-CM | POA: Diagnosis not present

## 2015-06-29 DIAGNOSIS — D649 Anemia, unspecified: Secondary | ICD-10-CM | POA: Diagnosis not present

## 2015-07-02 DIAGNOSIS — M6249 Contracture of muscle, multiple sites: Secondary | ICD-10-CM | POA: Diagnosis not present

## 2015-07-02 DIAGNOSIS — R279 Unspecified lack of coordination: Secondary | ICD-10-CM | POA: Diagnosis not present

## 2015-07-02 DIAGNOSIS — M6281 Muscle weakness (generalized): Secondary | ICD-10-CM | POA: Diagnosis not present

## 2015-07-03 DIAGNOSIS — M6249 Contracture of muscle, multiple sites: Secondary | ICD-10-CM | POA: Diagnosis not present

## 2015-07-03 DIAGNOSIS — R279 Unspecified lack of coordination: Secondary | ICD-10-CM | POA: Diagnosis not present

## 2015-07-03 DIAGNOSIS — M6281 Muscle weakness (generalized): Secondary | ICD-10-CM | POA: Diagnosis not present

## 2015-07-03 DIAGNOSIS — E119 Type 2 diabetes mellitus without complications: Secondary | ICD-10-CM | POA: Diagnosis not present

## 2015-07-03 DIAGNOSIS — H2512 Age-related nuclear cataract, left eye: Secondary | ICD-10-CM | POA: Diagnosis not present

## 2015-07-04 DIAGNOSIS — R279 Unspecified lack of coordination: Secondary | ICD-10-CM | POA: Diagnosis not present

## 2015-07-04 DIAGNOSIS — M6249 Contracture of muscle, multiple sites: Secondary | ICD-10-CM | POA: Diagnosis not present

## 2015-07-04 DIAGNOSIS — M6281 Muscle weakness (generalized): Secondary | ICD-10-CM | POA: Diagnosis not present

## 2015-07-05 DIAGNOSIS — M6249 Contracture of muscle, multiple sites: Secondary | ICD-10-CM | POA: Diagnosis not present

## 2015-07-05 DIAGNOSIS — R279 Unspecified lack of coordination: Secondary | ICD-10-CM | POA: Diagnosis not present

## 2015-07-05 DIAGNOSIS — M6281 Muscle weakness (generalized): Secondary | ICD-10-CM | POA: Diagnosis not present

## 2015-07-06 DIAGNOSIS — M6249 Contracture of muscle, multiple sites: Secondary | ICD-10-CM | POA: Diagnosis not present

## 2015-07-06 DIAGNOSIS — R279 Unspecified lack of coordination: Secondary | ICD-10-CM | POA: Diagnosis not present

## 2015-07-06 DIAGNOSIS — M6281 Muscle weakness (generalized): Secondary | ICD-10-CM | POA: Diagnosis not present

## 2015-07-09 DIAGNOSIS — M6281 Muscle weakness (generalized): Secondary | ICD-10-CM | POA: Diagnosis not present

## 2015-07-09 DIAGNOSIS — M6249 Contracture of muscle, multiple sites: Secondary | ICD-10-CM | POA: Diagnosis not present

## 2015-07-09 DIAGNOSIS — R279 Unspecified lack of coordination: Secondary | ICD-10-CM | POA: Diagnosis not present

## 2015-07-10 DIAGNOSIS — M6281 Muscle weakness (generalized): Secondary | ICD-10-CM | POA: Diagnosis not present

## 2015-07-10 DIAGNOSIS — R279 Unspecified lack of coordination: Secondary | ICD-10-CM | POA: Diagnosis not present

## 2015-07-10 DIAGNOSIS — M6249 Contracture of muscle, multiple sites: Secondary | ICD-10-CM | POA: Diagnosis not present

## 2015-07-11 DIAGNOSIS — M6281 Muscle weakness (generalized): Secondary | ICD-10-CM | POA: Diagnosis not present

## 2015-07-11 DIAGNOSIS — M6249 Contracture of muscle, multiple sites: Secondary | ICD-10-CM | POA: Diagnosis not present

## 2015-07-11 DIAGNOSIS — R279 Unspecified lack of coordination: Secondary | ICD-10-CM | POA: Diagnosis not present

## 2015-07-12 DIAGNOSIS — M6249 Contracture of muscle, multiple sites: Secondary | ICD-10-CM | POA: Diagnosis not present

## 2015-07-12 DIAGNOSIS — M6281 Muscle weakness (generalized): Secondary | ICD-10-CM | POA: Diagnosis not present

## 2015-07-12 DIAGNOSIS — R279 Unspecified lack of coordination: Secondary | ICD-10-CM | POA: Diagnosis not present

## 2015-07-13 DIAGNOSIS — M6249 Contracture of muscle, multiple sites: Secondary | ICD-10-CM | POA: Diagnosis not present

## 2015-07-13 DIAGNOSIS — M6281 Muscle weakness (generalized): Secondary | ICD-10-CM | POA: Diagnosis not present

## 2015-07-13 DIAGNOSIS — R279 Unspecified lack of coordination: Secondary | ICD-10-CM | POA: Diagnosis not present

## 2015-07-16 DIAGNOSIS — R279 Unspecified lack of coordination: Secondary | ICD-10-CM | POA: Diagnosis not present

## 2015-07-16 DIAGNOSIS — M6281 Muscle weakness (generalized): Secondary | ICD-10-CM | POA: Diagnosis not present

## 2015-07-16 DIAGNOSIS — M6249 Contracture of muscle, multiple sites: Secondary | ICD-10-CM | POA: Diagnosis not present

## 2015-07-17 DIAGNOSIS — R279 Unspecified lack of coordination: Secondary | ICD-10-CM | POA: Diagnosis not present

## 2015-07-17 DIAGNOSIS — M6281 Muscle weakness (generalized): Secondary | ICD-10-CM | POA: Diagnosis not present

## 2015-07-17 DIAGNOSIS — M6249 Contracture of muscle, multiple sites: Secondary | ICD-10-CM | POA: Diagnosis not present

## 2015-07-18 DIAGNOSIS — M6249 Contracture of muscle, multiple sites: Secondary | ICD-10-CM | POA: Diagnosis not present

## 2015-07-18 DIAGNOSIS — R279 Unspecified lack of coordination: Secondary | ICD-10-CM | POA: Diagnosis not present

## 2015-07-18 DIAGNOSIS — M6281 Muscle weakness (generalized): Secondary | ICD-10-CM | POA: Diagnosis not present

## 2015-07-19 DIAGNOSIS — R279 Unspecified lack of coordination: Secondary | ICD-10-CM | POA: Diagnosis not present

## 2015-07-19 DIAGNOSIS — M6249 Contracture of muscle, multiple sites: Secondary | ICD-10-CM | POA: Diagnosis not present

## 2015-07-19 DIAGNOSIS — M6281 Muscle weakness (generalized): Secondary | ICD-10-CM | POA: Diagnosis not present

## 2015-07-20 DIAGNOSIS — R279 Unspecified lack of coordination: Secondary | ICD-10-CM | POA: Diagnosis not present

## 2015-07-20 DIAGNOSIS — M6281 Muscle weakness (generalized): Secondary | ICD-10-CM | POA: Diagnosis not present

## 2015-07-20 DIAGNOSIS — M6249 Contracture of muscle, multiple sites: Secondary | ICD-10-CM | POA: Diagnosis not present

## 2015-07-23 DIAGNOSIS — M6281 Muscle weakness (generalized): Secondary | ICD-10-CM | POA: Diagnosis not present

## 2015-07-23 DIAGNOSIS — M6249 Contracture of muscle, multiple sites: Secondary | ICD-10-CM | POA: Diagnosis not present

## 2015-07-23 DIAGNOSIS — R279 Unspecified lack of coordination: Secondary | ICD-10-CM | POA: Diagnosis not present

## 2015-07-24 DIAGNOSIS — M6281 Muscle weakness (generalized): Secondary | ICD-10-CM | POA: Diagnosis not present

## 2015-07-24 DIAGNOSIS — M6249 Contracture of muscle, multiple sites: Secondary | ICD-10-CM | POA: Diagnosis not present

## 2015-07-24 DIAGNOSIS — R279 Unspecified lack of coordination: Secondary | ICD-10-CM | POA: Diagnosis not present

## 2015-07-25 DIAGNOSIS — M6249 Contracture of muscle, multiple sites: Secondary | ICD-10-CM | POA: Diagnosis not present

## 2015-07-25 DIAGNOSIS — M6281 Muscle weakness (generalized): Secondary | ICD-10-CM | POA: Diagnosis not present

## 2015-07-25 DIAGNOSIS — R279 Unspecified lack of coordination: Secondary | ICD-10-CM | POA: Diagnosis not present

## 2015-07-26 DIAGNOSIS — M6249 Contracture of muscle, multiple sites: Secondary | ICD-10-CM | POA: Diagnosis not present

## 2015-07-26 DIAGNOSIS — R279 Unspecified lack of coordination: Secondary | ICD-10-CM | POA: Diagnosis not present

## 2015-07-26 DIAGNOSIS — M6281 Muscle weakness (generalized): Secondary | ICD-10-CM | POA: Diagnosis not present

## 2015-07-27 DIAGNOSIS — M6281 Muscle weakness (generalized): Secondary | ICD-10-CM | POA: Diagnosis not present

## 2015-07-27 DIAGNOSIS — E119 Type 2 diabetes mellitus without complications: Secondary | ICD-10-CM | POA: Diagnosis not present

## 2015-07-27 DIAGNOSIS — M6249 Contracture of muscle, multiple sites: Secondary | ICD-10-CM | POA: Diagnosis not present

## 2015-07-27 DIAGNOSIS — R279 Unspecified lack of coordination: Secondary | ICD-10-CM | POA: Diagnosis not present

## 2015-07-30 DIAGNOSIS — R279 Unspecified lack of coordination: Secondary | ICD-10-CM | POA: Diagnosis not present

## 2015-07-30 DIAGNOSIS — M6281 Muscle weakness (generalized): Secondary | ICD-10-CM | POA: Diagnosis not present

## 2015-07-30 DIAGNOSIS — M6249 Contracture of muscle, multiple sites: Secondary | ICD-10-CM | POA: Diagnosis not present

## 2015-07-31 DIAGNOSIS — M6249 Contracture of muscle, multiple sites: Secondary | ICD-10-CM | POA: Diagnosis not present

## 2015-07-31 DIAGNOSIS — M6281 Muscle weakness (generalized): Secondary | ICD-10-CM | POA: Diagnosis not present

## 2015-07-31 DIAGNOSIS — R279 Unspecified lack of coordination: Secondary | ICD-10-CM | POA: Diagnosis not present

## 2015-07-31 DIAGNOSIS — E1169 Type 2 diabetes mellitus with other specified complication: Secondary | ICD-10-CM | POA: Diagnosis not present

## 2015-08-01 DIAGNOSIS — M6249 Contracture of muscle, multiple sites: Secondary | ICD-10-CM | POA: Diagnosis not present

## 2015-08-01 DIAGNOSIS — R279 Unspecified lack of coordination: Secondary | ICD-10-CM | POA: Diagnosis not present

## 2015-08-01 DIAGNOSIS — M6281 Muscle weakness (generalized): Secondary | ICD-10-CM | POA: Diagnosis not present

## 2015-08-02 DIAGNOSIS — M6281 Muscle weakness (generalized): Secondary | ICD-10-CM | POA: Diagnosis not present

## 2015-08-02 DIAGNOSIS — M6249 Contracture of muscle, multiple sites: Secondary | ICD-10-CM | POA: Diagnosis not present

## 2015-08-02 DIAGNOSIS — R279 Unspecified lack of coordination: Secondary | ICD-10-CM | POA: Diagnosis not present

## 2015-08-03 DIAGNOSIS — R279 Unspecified lack of coordination: Secondary | ICD-10-CM | POA: Diagnosis not present

## 2015-08-03 DIAGNOSIS — M6281 Muscle weakness (generalized): Secondary | ICD-10-CM | POA: Diagnosis not present

## 2015-08-03 DIAGNOSIS — M6249 Contracture of muscle, multiple sites: Secondary | ICD-10-CM | POA: Diagnosis not present

## 2015-08-06 DIAGNOSIS — M6249 Contracture of muscle, multiple sites: Secondary | ICD-10-CM | POA: Diagnosis not present

## 2015-08-06 DIAGNOSIS — R279 Unspecified lack of coordination: Secondary | ICD-10-CM | POA: Diagnosis not present

## 2015-08-06 DIAGNOSIS — M6281 Muscle weakness (generalized): Secondary | ICD-10-CM | POA: Diagnosis not present

## 2015-08-07 DIAGNOSIS — M6281 Muscle weakness (generalized): Secondary | ICD-10-CM | POA: Diagnosis not present

## 2015-08-07 DIAGNOSIS — R279 Unspecified lack of coordination: Secondary | ICD-10-CM | POA: Diagnosis not present

## 2015-08-07 DIAGNOSIS — M6249 Contracture of muscle, multiple sites: Secondary | ICD-10-CM | POA: Diagnosis not present

## 2015-08-08 DIAGNOSIS — M6249 Contracture of muscle, multiple sites: Secondary | ICD-10-CM | POA: Diagnosis not present

## 2015-08-08 DIAGNOSIS — M6281 Muscle weakness (generalized): Secondary | ICD-10-CM | POA: Diagnosis not present

## 2015-08-08 DIAGNOSIS — R279 Unspecified lack of coordination: Secondary | ICD-10-CM | POA: Diagnosis not present

## 2015-08-09 DIAGNOSIS — M6281 Muscle weakness (generalized): Secondary | ICD-10-CM | POA: Diagnosis not present

## 2015-08-09 DIAGNOSIS — M6249 Contracture of muscle, multiple sites: Secondary | ICD-10-CM | POA: Diagnosis not present

## 2015-08-09 DIAGNOSIS — R279 Unspecified lack of coordination: Secondary | ICD-10-CM | POA: Diagnosis not present

## 2015-08-10 DIAGNOSIS — M6281 Muscle weakness (generalized): Secondary | ICD-10-CM | POA: Diagnosis not present

## 2015-08-10 DIAGNOSIS — M6249 Contracture of muscle, multiple sites: Secondary | ICD-10-CM | POA: Diagnosis not present

## 2015-08-10 DIAGNOSIS — R279 Unspecified lack of coordination: Secondary | ICD-10-CM | POA: Diagnosis not present

## 2015-08-11 DIAGNOSIS — M6281 Muscle weakness (generalized): Secondary | ICD-10-CM | POA: Diagnosis not present

## 2015-08-11 DIAGNOSIS — M6249 Contracture of muscle, multiple sites: Secondary | ICD-10-CM | POA: Diagnosis not present

## 2015-08-11 DIAGNOSIS — R279 Unspecified lack of coordination: Secondary | ICD-10-CM | POA: Diagnosis not present

## 2015-08-13 DIAGNOSIS — R279 Unspecified lack of coordination: Secondary | ICD-10-CM | POA: Diagnosis not present

## 2015-08-13 DIAGNOSIS — M6249 Contracture of muscle, multiple sites: Secondary | ICD-10-CM | POA: Diagnosis not present

## 2015-08-13 DIAGNOSIS — M6281 Muscle weakness (generalized): Secondary | ICD-10-CM | POA: Diagnosis not present

## 2015-08-14 DIAGNOSIS — R279 Unspecified lack of coordination: Secondary | ICD-10-CM | POA: Diagnosis not present

## 2015-08-14 DIAGNOSIS — M6281 Muscle weakness (generalized): Secondary | ICD-10-CM | POA: Diagnosis not present

## 2015-08-14 DIAGNOSIS — M6249 Contracture of muscle, multiple sites: Secondary | ICD-10-CM | POA: Diagnosis not present

## 2015-08-15 DIAGNOSIS — R279 Unspecified lack of coordination: Secondary | ICD-10-CM | POA: Diagnosis not present

## 2015-08-15 DIAGNOSIS — M6249 Contracture of muscle, multiple sites: Secondary | ICD-10-CM | POA: Diagnosis not present

## 2015-08-15 DIAGNOSIS — M6281 Muscle weakness (generalized): Secondary | ICD-10-CM | POA: Diagnosis not present

## 2015-08-16 DIAGNOSIS — R279 Unspecified lack of coordination: Secondary | ICD-10-CM | POA: Diagnosis not present

## 2015-08-16 DIAGNOSIS — M6249 Contracture of muscle, multiple sites: Secondary | ICD-10-CM | POA: Diagnosis not present

## 2015-08-16 DIAGNOSIS — M6281 Muscle weakness (generalized): Secondary | ICD-10-CM | POA: Diagnosis not present

## 2015-08-17 DIAGNOSIS — I10 Essential (primary) hypertension: Secondary | ICD-10-CM | POA: Diagnosis not present

## 2015-08-17 DIAGNOSIS — R4189 Other symptoms and signs involving cognitive functions and awareness: Secondary | ICD-10-CM | POA: Diagnosis not present

## 2015-08-17 DIAGNOSIS — M6249 Contracture of muscle, multiple sites: Secondary | ICD-10-CM | POA: Diagnosis not present

## 2015-08-17 DIAGNOSIS — N390 Urinary tract infection, site not specified: Secondary | ICD-10-CM | POA: Diagnosis not present

## 2015-08-17 DIAGNOSIS — M6281 Muscle weakness (generalized): Secondary | ICD-10-CM | POA: Diagnosis not present

## 2015-08-17 DIAGNOSIS — D649 Anemia, unspecified: Secondary | ICD-10-CM | POA: Diagnosis not present

## 2015-08-17 DIAGNOSIS — R279 Unspecified lack of coordination: Secondary | ICD-10-CM | POA: Diagnosis not present

## 2015-08-19 DIAGNOSIS — M6281 Muscle weakness (generalized): Secondary | ICD-10-CM | POA: Diagnosis not present

## 2015-08-19 DIAGNOSIS — M6249 Contracture of muscle, multiple sites: Secondary | ICD-10-CM | POA: Diagnosis not present

## 2015-08-19 DIAGNOSIS — R279 Unspecified lack of coordination: Secondary | ICD-10-CM | POA: Diagnosis not present

## 2015-08-20 DIAGNOSIS — M6249 Contracture of muscle, multiple sites: Secondary | ICD-10-CM | POA: Diagnosis not present

## 2015-08-20 DIAGNOSIS — M6281 Muscle weakness (generalized): Secondary | ICD-10-CM | POA: Diagnosis not present

## 2015-08-20 DIAGNOSIS — R279 Unspecified lack of coordination: Secondary | ICD-10-CM | POA: Diagnosis not present

## 2015-08-21 DIAGNOSIS — M6281 Muscle weakness (generalized): Secondary | ICD-10-CM | POA: Diagnosis not present

## 2015-08-21 DIAGNOSIS — M6249 Contracture of muscle, multiple sites: Secondary | ICD-10-CM | POA: Diagnosis not present

## 2015-08-21 DIAGNOSIS — R279 Unspecified lack of coordination: Secondary | ICD-10-CM | POA: Diagnosis not present

## 2015-08-22 DIAGNOSIS — R279 Unspecified lack of coordination: Secondary | ICD-10-CM | POA: Diagnosis not present

## 2015-08-22 DIAGNOSIS — M6249 Contracture of muscle, multiple sites: Secondary | ICD-10-CM | POA: Diagnosis not present

## 2015-08-22 DIAGNOSIS — M6281 Muscle weakness (generalized): Secondary | ICD-10-CM | POA: Diagnosis not present

## 2015-08-23 DIAGNOSIS — R279 Unspecified lack of coordination: Secondary | ICD-10-CM | POA: Diagnosis not present

## 2015-08-23 DIAGNOSIS — M6281 Muscle weakness (generalized): Secondary | ICD-10-CM | POA: Diagnosis not present

## 2015-08-23 DIAGNOSIS — M6249 Contracture of muscle, multiple sites: Secondary | ICD-10-CM | POA: Diagnosis not present

## 2015-08-24 DIAGNOSIS — M6281 Muscle weakness (generalized): Secondary | ICD-10-CM | POA: Diagnosis not present

## 2015-08-24 DIAGNOSIS — R279 Unspecified lack of coordination: Secondary | ICD-10-CM | POA: Diagnosis not present

## 2015-08-24 DIAGNOSIS — M6249 Contracture of muscle, multiple sites: Secondary | ICD-10-CM | POA: Diagnosis not present

## 2015-08-26 DIAGNOSIS — M6281 Muscle weakness (generalized): Secondary | ICD-10-CM | POA: Diagnosis not present

## 2015-08-26 DIAGNOSIS — R279 Unspecified lack of coordination: Secondary | ICD-10-CM | POA: Diagnosis not present

## 2015-08-26 DIAGNOSIS — M6249 Contracture of muscle, multiple sites: Secondary | ICD-10-CM | POA: Diagnosis not present

## 2015-08-27 DIAGNOSIS — R279 Unspecified lack of coordination: Secondary | ICD-10-CM | POA: Diagnosis not present

## 2015-08-27 DIAGNOSIS — M6281 Muscle weakness (generalized): Secondary | ICD-10-CM | POA: Diagnosis not present

## 2015-08-27 DIAGNOSIS — M6249 Contracture of muscle, multiple sites: Secondary | ICD-10-CM | POA: Diagnosis not present

## 2015-08-28 DIAGNOSIS — R279 Unspecified lack of coordination: Secondary | ICD-10-CM | POA: Diagnosis not present

## 2015-08-28 DIAGNOSIS — M6281 Muscle weakness (generalized): Secondary | ICD-10-CM | POA: Diagnosis not present

## 2015-08-28 DIAGNOSIS — M6249 Contracture of muscle, multiple sites: Secondary | ICD-10-CM | POA: Diagnosis not present

## 2015-08-29 DIAGNOSIS — M6249 Contracture of muscle, multiple sites: Secondary | ICD-10-CM | POA: Diagnosis not present

## 2015-08-29 DIAGNOSIS — R279 Unspecified lack of coordination: Secondary | ICD-10-CM | POA: Diagnosis not present

## 2015-08-29 DIAGNOSIS — M6281 Muscle weakness (generalized): Secondary | ICD-10-CM | POA: Diagnosis not present

## 2015-08-29 DIAGNOSIS — F329 Major depressive disorder, single episode, unspecified: Secondary | ICD-10-CM | POA: Diagnosis not present

## 2015-08-30 DIAGNOSIS — R279 Unspecified lack of coordination: Secondary | ICD-10-CM | POA: Diagnosis not present

## 2015-08-30 DIAGNOSIS — M6249 Contracture of muscle, multiple sites: Secondary | ICD-10-CM | POA: Diagnosis not present

## 2015-08-30 DIAGNOSIS — M6281 Muscle weakness (generalized): Secondary | ICD-10-CM | POA: Diagnosis not present

## 2015-08-31 DIAGNOSIS — R279 Unspecified lack of coordination: Secondary | ICD-10-CM | POA: Diagnosis not present

## 2015-08-31 DIAGNOSIS — M6281 Muscle weakness (generalized): Secondary | ICD-10-CM | POA: Diagnosis not present

## 2015-08-31 DIAGNOSIS — M6249 Contracture of muscle, multiple sites: Secondary | ICD-10-CM | POA: Diagnosis not present

## 2015-09-02 DIAGNOSIS — M6249 Contracture of muscle, multiple sites: Secondary | ICD-10-CM | POA: Diagnosis not present

## 2015-09-02 DIAGNOSIS — M6281 Muscle weakness (generalized): Secondary | ICD-10-CM | POA: Diagnosis not present

## 2015-09-02 DIAGNOSIS — R279 Unspecified lack of coordination: Secondary | ICD-10-CM | POA: Diagnosis not present

## 2015-09-03 DIAGNOSIS — R279 Unspecified lack of coordination: Secondary | ICD-10-CM | POA: Diagnosis not present

## 2015-09-03 DIAGNOSIS — G43809 Other migraine, not intractable, without status migrainosus: Secondary | ICD-10-CM | POA: Diagnosis not present

## 2015-09-03 DIAGNOSIS — H2512 Age-related nuclear cataract, left eye: Secondary | ICD-10-CM | POA: Diagnosis not present

## 2015-09-03 DIAGNOSIS — M6281 Muscle weakness (generalized): Secondary | ICD-10-CM | POA: Diagnosis not present

## 2015-09-03 DIAGNOSIS — M6249 Contracture of muscle, multiple sites: Secondary | ICD-10-CM | POA: Diagnosis not present

## 2015-09-04 DIAGNOSIS — M6281 Muscle weakness (generalized): Secondary | ICD-10-CM | POA: Diagnosis not present

## 2015-09-04 DIAGNOSIS — R279 Unspecified lack of coordination: Secondary | ICD-10-CM | POA: Diagnosis not present

## 2015-09-04 DIAGNOSIS — M6249 Contracture of muscle, multiple sites: Secondary | ICD-10-CM | POA: Diagnosis not present

## 2015-09-05 DIAGNOSIS — R279 Unspecified lack of coordination: Secondary | ICD-10-CM | POA: Diagnosis not present

## 2015-09-05 DIAGNOSIS — M6281 Muscle weakness (generalized): Secondary | ICD-10-CM | POA: Diagnosis not present

## 2015-09-05 DIAGNOSIS — M6249 Contracture of muscle, multiple sites: Secondary | ICD-10-CM | POA: Diagnosis not present

## 2015-09-06 DIAGNOSIS — M6249 Contracture of muscle, multiple sites: Secondary | ICD-10-CM | POA: Diagnosis not present

## 2015-09-06 DIAGNOSIS — R279 Unspecified lack of coordination: Secondary | ICD-10-CM | POA: Diagnosis not present

## 2015-09-06 DIAGNOSIS — M6281 Muscle weakness (generalized): Secondary | ICD-10-CM | POA: Diagnosis not present

## 2015-09-07 DIAGNOSIS — M6281 Muscle weakness (generalized): Secondary | ICD-10-CM | POA: Diagnosis not present

## 2015-09-07 DIAGNOSIS — R279 Unspecified lack of coordination: Secondary | ICD-10-CM | POA: Diagnosis not present

## 2015-09-07 DIAGNOSIS — M6249 Contracture of muscle, multiple sites: Secondary | ICD-10-CM | POA: Diagnosis not present

## 2015-09-10 DIAGNOSIS — M6249 Contracture of muscle, multiple sites: Secondary | ICD-10-CM | POA: Diagnosis not present

## 2015-09-10 DIAGNOSIS — M6281 Muscle weakness (generalized): Secondary | ICD-10-CM | POA: Diagnosis not present

## 2015-09-10 DIAGNOSIS — R279 Unspecified lack of coordination: Secondary | ICD-10-CM | POA: Diagnosis not present

## 2015-09-11 DIAGNOSIS — M6249 Contracture of muscle, multiple sites: Secondary | ICD-10-CM | POA: Diagnosis not present

## 2015-09-11 DIAGNOSIS — M6281 Muscle weakness (generalized): Secondary | ICD-10-CM | POA: Diagnosis not present

## 2015-09-11 DIAGNOSIS — R279 Unspecified lack of coordination: Secondary | ICD-10-CM | POA: Diagnosis not present

## 2015-09-12 DIAGNOSIS — R279 Unspecified lack of coordination: Secondary | ICD-10-CM | POA: Diagnosis not present

## 2015-09-12 DIAGNOSIS — M6249 Contracture of muscle, multiple sites: Secondary | ICD-10-CM | POA: Diagnosis not present

## 2015-09-12 DIAGNOSIS — M6281 Muscle weakness (generalized): Secondary | ICD-10-CM | POA: Diagnosis not present

## 2015-09-13 DIAGNOSIS — M6249 Contracture of muscle, multiple sites: Secondary | ICD-10-CM | POA: Diagnosis not present

## 2015-09-13 DIAGNOSIS — M6281 Muscle weakness (generalized): Secondary | ICD-10-CM | POA: Diagnosis not present

## 2015-09-13 DIAGNOSIS — R279 Unspecified lack of coordination: Secondary | ICD-10-CM | POA: Diagnosis not present

## 2015-09-14 DIAGNOSIS — R279 Unspecified lack of coordination: Secondary | ICD-10-CM | POA: Diagnosis not present

## 2015-09-14 DIAGNOSIS — M6249 Contracture of muscle, multiple sites: Secondary | ICD-10-CM | POA: Diagnosis not present

## 2015-09-14 DIAGNOSIS — M6281 Muscle weakness (generalized): Secondary | ICD-10-CM | POA: Diagnosis not present

## 2015-09-17 DIAGNOSIS — M6249 Contracture of muscle, multiple sites: Secondary | ICD-10-CM | POA: Diagnosis not present

## 2015-09-17 DIAGNOSIS — M6281 Muscle weakness (generalized): Secondary | ICD-10-CM | POA: Diagnosis not present

## 2015-09-17 DIAGNOSIS — R279 Unspecified lack of coordination: Secondary | ICD-10-CM | POA: Diagnosis not present

## 2015-09-18 DIAGNOSIS — R279 Unspecified lack of coordination: Secondary | ICD-10-CM | POA: Diagnosis not present

## 2015-09-18 DIAGNOSIS — M6281 Muscle weakness (generalized): Secondary | ICD-10-CM | POA: Diagnosis not present

## 2015-09-18 DIAGNOSIS — M6249 Contracture of muscle, multiple sites: Secondary | ICD-10-CM | POA: Diagnosis not present

## 2015-09-19 DIAGNOSIS — M6249 Contracture of muscle, multiple sites: Secondary | ICD-10-CM | POA: Diagnosis not present

## 2015-09-19 DIAGNOSIS — R279 Unspecified lack of coordination: Secondary | ICD-10-CM | POA: Diagnosis not present

## 2015-09-19 DIAGNOSIS — E1169 Type 2 diabetes mellitus with other specified complication: Secondary | ICD-10-CM | POA: Diagnosis not present

## 2015-09-19 DIAGNOSIS — M6281 Muscle weakness (generalized): Secondary | ICD-10-CM | POA: Diagnosis not present

## 2015-09-20 DIAGNOSIS — R279 Unspecified lack of coordination: Secondary | ICD-10-CM | POA: Diagnosis not present

## 2015-09-20 DIAGNOSIS — M6249 Contracture of muscle, multiple sites: Secondary | ICD-10-CM | POA: Diagnosis not present

## 2015-09-20 DIAGNOSIS — M6281 Muscle weakness (generalized): Secondary | ICD-10-CM | POA: Diagnosis not present

## 2015-09-21 DIAGNOSIS — R279 Unspecified lack of coordination: Secondary | ICD-10-CM | POA: Diagnosis not present

## 2015-09-21 DIAGNOSIS — M6281 Muscle weakness (generalized): Secondary | ICD-10-CM | POA: Diagnosis not present

## 2015-09-21 DIAGNOSIS — M6249 Contracture of muscle, multiple sites: Secondary | ICD-10-CM | POA: Diagnosis not present

## 2015-09-23 DIAGNOSIS — M6249 Contracture of muscle, multiple sites: Secondary | ICD-10-CM | POA: Diagnosis not present

## 2015-09-23 DIAGNOSIS — R279 Unspecified lack of coordination: Secondary | ICD-10-CM | POA: Diagnosis not present

## 2015-09-23 DIAGNOSIS — M6281 Muscle weakness (generalized): Secondary | ICD-10-CM | POA: Diagnosis not present

## 2015-09-24 DIAGNOSIS — R279 Unspecified lack of coordination: Secondary | ICD-10-CM | POA: Diagnosis not present

## 2015-09-24 DIAGNOSIS — M6249 Contracture of muscle, multiple sites: Secondary | ICD-10-CM | POA: Diagnosis not present

## 2015-09-24 DIAGNOSIS — M6281 Muscle weakness (generalized): Secondary | ICD-10-CM | POA: Diagnosis not present

## 2015-09-25 DIAGNOSIS — M6249 Contracture of muscle, multiple sites: Secondary | ICD-10-CM | POA: Diagnosis not present

## 2015-09-25 DIAGNOSIS — R279 Unspecified lack of coordination: Secondary | ICD-10-CM | POA: Diagnosis not present

## 2015-09-25 DIAGNOSIS — M6281 Muscle weakness (generalized): Secondary | ICD-10-CM | POA: Diagnosis not present

## 2015-09-26 DIAGNOSIS — M6281 Muscle weakness (generalized): Secondary | ICD-10-CM | POA: Diagnosis not present

## 2015-09-26 DIAGNOSIS — M6249 Contracture of muscle, multiple sites: Secondary | ICD-10-CM | POA: Diagnosis not present

## 2015-09-26 DIAGNOSIS — R279 Unspecified lack of coordination: Secondary | ICD-10-CM | POA: Diagnosis not present

## 2015-10-04 DIAGNOSIS — E876 Hypokalemia: Secondary | ICD-10-CM | POA: Diagnosis not present

## 2015-10-04 DIAGNOSIS — A047 Enterocolitis due to Clostridium difficile: Secondary | ICD-10-CM | POA: Diagnosis not present

## 2015-10-04 DIAGNOSIS — I110 Hypertensive heart disease with heart failure: Secondary | ICD-10-CM | POA: Diagnosis not present

## 2015-10-04 DIAGNOSIS — E1151 Type 2 diabetes mellitus with diabetic peripheral angiopathy without gangrene: Secondary | ICD-10-CM | POA: Diagnosis not present

## 2015-10-04 DIAGNOSIS — R197 Diarrhea, unspecified: Secondary | ICD-10-CM | POA: Diagnosis not present

## 2015-10-04 DIAGNOSIS — K8020 Calculus of gallbladder without cholecystitis without obstruction: Secondary | ICD-10-CM | POA: Diagnosis not present

## 2015-10-04 DIAGNOSIS — R112 Nausea with vomiting, unspecified: Secondary | ICD-10-CM | POA: Diagnosis not present

## 2015-10-04 DIAGNOSIS — K2970 Gastritis, unspecified, without bleeding: Secondary | ICD-10-CM | POA: Diagnosis not present

## 2015-10-04 DIAGNOSIS — N390 Urinary tract infection, site not specified: Secondary | ICD-10-CM | POA: Diagnosis not present

## 2015-10-04 DIAGNOSIS — I509 Heart failure, unspecified: Secondary | ICD-10-CM | POA: Diagnosis not present

## 2015-10-05 DIAGNOSIS — N390 Urinary tract infection, site not specified: Secondary | ICD-10-CM | POA: Diagnosis present

## 2015-10-05 DIAGNOSIS — I69398 Other sequelae of cerebral infarction: Secondary | ICD-10-CM | POA: Diagnosis not present

## 2015-10-05 DIAGNOSIS — N3281 Overactive bladder: Secondary | ICD-10-CM | POA: Diagnosis present

## 2015-10-05 DIAGNOSIS — Z794 Long term (current) use of insulin: Secondary | ICD-10-CM | POA: Diagnosis not present

## 2015-10-05 DIAGNOSIS — Z89511 Acquired absence of right leg below knee: Secondary | ICD-10-CM | POA: Diagnosis not present

## 2015-10-05 DIAGNOSIS — I2510 Atherosclerotic heart disease of native coronary artery without angina pectoris: Secondary | ICD-10-CM | POA: Diagnosis present

## 2015-10-05 DIAGNOSIS — H538 Other visual disturbances: Secondary | ICD-10-CM | POA: Diagnosis present

## 2015-10-05 DIAGNOSIS — F329 Major depressive disorder, single episode, unspecified: Secondary | ICD-10-CM | POA: Diagnosis present

## 2015-10-05 DIAGNOSIS — E876 Hypokalemia: Secondary | ICD-10-CM | POA: Diagnosis present

## 2015-10-05 DIAGNOSIS — Z743 Need for continuous supervision: Secondary | ICD-10-CM | POA: Diagnosis not present

## 2015-10-05 DIAGNOSIS — I110 Hypertensive heart disease with heart failure: Secondary | ICD-10-CM | POA: Diagnosis present

## 2015-10-05 DIAGNOSIS — A047 Enterocolitis due to Clostridium difficile: Secondary | ICD-10-CM | POA: Diagnosis present

## 2015-10-05 DIAGNOSIS — E1142 Type 2 diabetes mellitus with diabetic polyneuropathy: Secondary | ICD-10-CM | POA: Diagnosis present

## 2015-10-05 DIAGNOSIS — Z888 Allergy status to other drugs, medicaments and biological substances status: Secondary | ICD-10-CM | POA: Diagnosis not present

## 2015-10-05 DIAGNOSIS — E1151 Type 2 diabetes mellitus with diabetic peripheral angiopathy without gangrene: Secondary | ICD-10-CM | POA: Diagnosis present

## 2015-10-05 DIAGNOSIS — I509 Heart failure, unspecified: Secondary | ICD-10-CM | POA: Diagnosis present

## 2015-10-05 DIAGNOSIS — H409 Unspecified glaucoma: Secondary | ICD-10-CM | POA: Diagnosis present

## 2015-10-05 DIAGNOSIS — Z89512 Acquired absence of left leg below knee: Secondary | ICD-10-CM | POA: Diagnosis not present

## 2015-10-05 DIAGNOSIS — K8020 Calculus of gallbladder without cholecystitis without obstruction: Secondary | ICD-10-CM | POA: Diagnosis not present

## 2015-10-05 DIAGNOSIS — Z6838 Body mass index (BMI) 38.0-38.9, adult: Secondary | ICD-10-CM | POA: Diagnosis not present

## 2015-10-05 DIAGNOSIS — Z79899 Other long term (current) drug therapy: Secondary | ICD-10-CM | POA: Diagnosis not present

## 2015-10-05 DIAGNOSIS — Z79891 Long term (current) use of opiate analgesic: Secondary | ICD-10-CM | POA: Diagnosis not present

## 2015-10-05 DIAGNOSIS — R279 Unspecified lack of coordination: Secondary | ICD-10-CM | POA: Diagnosis not present

## 2015-10-11 DIAGNOSIS — A047 Enterocolitis due to Clostridium difficile: Secondary | ICD-10-CM | POA: Diagnosis not present

## 2015-10-19 DIAGNOSIS — E119 Type 2 diabetes mellitus without complications: Secondary | ICD-10-CM | POA: Diagnosis not present

## 2015-10-19 DIAGNOSIS — E785 Hyperlipidemia, unspecified: Secondary | ICD-10-CM | POA: Diagnosis not present

## 2015-10-19 DIAGNOSIS — R710 Precipitous drop in hematocrit: Secondary | ICD-10-CM | POA: Diagnosis not present

## 2015-10-19 DIAGNOSIS — D649 Anemia, unspecified: Secondary | ICD-10-CM | POA: Diagnosis not present

## 2015-10-19 DIAGNOSIS — I10 Essential (primary) hypertension: Secondary | ICD-10-CM | POA: Diagnosis not present

## 2015-11-02 DIAGNOSIS — Z993 Dependence on wheelchair: Secondary | ICD-10-CM | POA: Diagnosis not present

## 2015-11-02 DIAGNOSIS — Z1231 Encounter for screening mammogram for malignant neoplasm of breast: Secondary | ICD-10-CM | POA: Diagnosis not present

## 2015-11-04 DIAGNOSIS — E1169 Type 2 diabetes mellitus with other specified complication: Secondary | ICD-10-CM | POA: Diagnosis not present

## 2015-12-05 DIAGNOSIS — H35043 Retinal micro-aneurysms, unspecified, bilateral: Secondary | ICD-10-CM | POA: Diagnosis not present

## 2015-12-05 DIAGNOSIS — H43822 Vitreomacular adhesion, left eye: Secondary | ICD-10-CM | POA: Diagnosis not present

## 2015-12-05 DIAGNOSIS — E113553 Type 2 diabetes mellitus with stable proliferative diabetic retinopathy, bilateral: Secondary | ICD-10-CM | POA: Diagnosis not present

## 2015-12-05 DIAGNOSIS — H43811 Vitreous degeneration, right eye: Secondary | ICD-10-CM | POA: Diagnosis not present

## 2015-12-06 ENCOUNTER — Encounter: Payer: Self-pay | Admitting: Obstetrics and Gynecology

## 2015-12-06 ENCOUNTER — Ambulatory Visit (INDEPENDENT_AMBULATORY_CARE_PROVIDER_SITE_OTHER): Payer: Medicare Other | Admitting: Obstetrics and Gynecology

## 2015-12-06 ENCOUNTER — Other Ambulatory Visit: Payer: Self-pay | Admitting: Obstetrics and Gynecology

## 2015-12-06 VITALS — BP 158/88 | HR 74

## 2015-12-06 DIAGNOSIS — N950 Postmenopausal bleeding: Secondary | ICD-10-CM | POA: Diagnosis not present

## 2015-12-06 DIAGNOSIS — Z89512 Acquired absence of left leg below knee: Secondary | ICD-10-CM

## 2015-12-06 DIAGNOSIS — N858 Other specified noninflammatory disorders of uterus: Secondary | ICD-10-CM | POA: Diagnosis not present

## 2015-12-06 DIAGNOSIS — N939 Abnormal uterine and vaginal bleeding, unspecified: Secondary | ICD-10-CM

## 2015-12-06 DIAGNOSIS — Z7401 Bed confinement status: Secondary | ICD-10-CM | POA: Diagnosis not present

## 2015-12-06 DIAGNOSIS — E1169 Type 2 diabetes mellitus with other specified complication: Secondary | ICD-10-CM | POA: Diagnosis not present

## 2015-12-06 DIAGNOSIS — Z89511 Acquired absence of right leg below knee: Secondary | ICD-10-CM

## 2015-12-06 DIAGNOSIS — R279 Unspecified lack of coordination: Secondary | ICD-10-CM | POA: Diagnosis not present

## 2015-12-06 DIAGNOSIS — L89311 Pressure ulcer of right buttock, stage 1: Secondary | ICD-10-CM | POA: Diagnosis not present

## 2015-12-06 NOTE — Progress Notes (Addendum)
[facility] Visit  12/06/2015         Patient name: Emily Shea MRN [id number]  Date of birth: 12/09/1955  CC & WUJ:WJXBJ level V visit   Emily Shea is a 60 y.o. female brought in by EMS from [facility] presenting today for abnormal vaginal bleeding for 1 week. Pt is not sexually active. She is postmenopausal and denies use of hormone replacement therapy. She also denies vaginal itching. No alleviating factors noted.   ROS:  ROS Otherwise negative negative for acute change except as noted in the HPI.  Pertinent History Reviewed:   Reviewed: Significant for DM and bilateral BKA Medical         Past Medical History:  Diagnosis Date  . Anemia   . Blindness   . Chronic kidney disease   . Diabetes mellitus without complication   . DVT (deep venous thrombosis)   . GERD (gastroesophageal reflux disease)   . Hypertension   . Stroke                               Surgical Hx:    Past Surgical History:  Procedure Laterality Date  . ABDOMINAL AORTAGRAM N/A 03/29/2013   Procedure: ABDOMINAL AORTAGRAM;  Surgeon: Nada Libman, MD;  Location: [facility] CATH LAB;  Service: Cardiovascular;  Laterality: N/A;  . AMPUTATION N/A 04/04/2013   Procedure: AMPUTATION BELOW KNEE;  Surgeon: Larina Earthly, MD;  Location: [facility] OR;  Service: Vascular;  Laterality: N/A;  . APPENDECTOMY    . LIPOMA EXCISION     from L flank   Medications: Reviewed & Updated - see associated section                       Current Outpatient Prescriptions:  .  amLODipine (NORVASC) 5 MG tablet, Take 1 tablet (5 mg total) by mouth daily., Disp: , Rfl:  .  aspirin 81 MG tablet, Take 81 mg by mouth daily., Disp: , Rfl:  .  ferrous sulfate 325 (65 FE) MG tablet, Take 325 mg by mouth daily with breakfast., Disp: , Rfl:  .  gabapentin (NEURONTIN) 300 MG capsule, Take 300 mg by mouth daily., Disp: , Rfl:  .  glimepiride (AMARYL) 4 MG tablet, Take 4 mg by mouth daily with breakfast., Disp: , Rfl:  .  Glucose Blood  (ACCU-CHEK AVIVA PLUS VI), 1 each by Other route See admin instructions. Check blood sugar twice daily., Disp: , Rfl:  .  HYDROcodone-acetaminophen (NORCO/VICODIN) 5-325 MG per tablet, Take 1 tablet by mouth every 4 (four) hours as needed for moderate pain., Disp: , Rfl:  .  insulin glargine (LANTUS) 100 UNIT/ML injection, Inject 50 Units into the skin at bedtime., Disp: , Rfl:  .  insulin lispro (HUMALOG) 100 UNIT/ML injection, Inject 0-15 Units into the skin 2 (two) times daily. Per sliding scale 0-200 = 0 units, 200-300 = 10 units, 300-500 = 15 units, >500 units = call MD., Disp: , Rfl:  .  latanoprost (XALATAN) 0.005 % ophthalmic solution, Place 1 drop into both eyes at bedtime., Disp: , Rfl:  .  losartan-hydrochlorothiazide (HYZAAR) 100-12.5 MG per tablet, Take 1 tablet by mouth daily., Disp: , Rfl:  .  metoprolol succinate (TOPROL-XL) 50 MG 24 hr tablet, Take 50 mg by mouth daily. Take with or immediately following a meal., Disp: , Rfl:  .  potassium chloride SA (K-DUR,KLOR-CON) 20  MEQ tablet, Take 20 mEq by mouth daily., Disp: , Rfl:  .  pravastatin (PRAVACHOL) 20 MG tablet, Take 20 mg by mouth daily., Disp: , Rfl:  .  ranitidine (ZANTAC) 150 MG tablet, Take 150 mg by mouth 2 (two) times daily. , Disp: , Rfl:  .  solifenacin (VESICARE) 5 MG tablet, Take 5 mg by mouth daily. , Disp: , Rfl:    Social History: Reviewed -  reports that she has never smoked. She has never used smokeless tobacco.  Objective Findings:  Vitals: There were no vitals taken for this visit.  Physical Examination: General appearance - alert, in no distress and overweight Mental status - oriented to person, place, and time Pelvic - Decubitus ulcer right buttock cheek, just beginning to break down VULVA: normal appearing vulva with no masses, tenderness or lesions VAGINA: no lesions; dark blood and atrophic tissue  CERVIX: larger cervix; dark blood in os no lesions  UTERUS: 10 cm  WET MOUNT done - results:  blood Extremities - BLE BKA  Transvaginal US shows large amount of tissue in endometrial cavity vs fibroids. , On a technically challenging exam as patient unable to cooperate with movement to the exam table. We were able to perform the transvaginal ultrasound on the transport gurney after positioning the patient with elevation of hips  Endometrial Biopsy: Patient given informed consent, signed copy in the chart, time out was performed. Time out taken. The patient was placed in the lithotomy position and the cervix brought into view with sterile speculum.  Portio of cervix cleansed x 2 with betadine swabs.  A tenaculum was placed in the anterior lip of the cervix. The uterus was sounded for depth of 10 cm,. Milex uterine Explora 3 mm was introduced to into the uterus, suction created, moderate amount of endometrial sample was obtained. All equipment was removed and accounted for.   The patient tolerated the procedure well.   Patient given post procedure instructions.     Assessment & Plan:   A:  1. Postmenopausal bleeding 2. Diabetes mellitus 3. status post bilateral BKA amputation 4. Early decubitus ulcer right buttock cheek  P:  1. F/u pathology by phone. [facility].  2. We'll mentioned decubitus on her right buttock cheek beneath right ischial tuberosity [facility]   By signing my name below, I, Freida Busmaniana Omoyeni, attest that this documentation has been prepared under the direction and in the presence of Tilda BurrowJohn V Matt Delpizzo, MD . Electronically Signed: Freida Busmaniana Omoyeni, Scribe. 12/06/2015. 2:44 PM. I personally performed the services described in this documentation, which was SCRIBED in my presence. The recorded information has been reviewed and considered accurate. It has been edited as necessary during review. Tilda BurrowFERGUSON,Shamere Campas V, MD  Over one hour spent assessing patient reviewing records, attempting exam limited by bilateral amputation,  performing transvaginal ultrasound, and proceeding with informed consent and then endometrial biopsy

## 2015-12-11 ENCOUNTER — Telehealth: Payer: Self-pay | Admitting: *Deleted

## 2015-12-19 DIAGNOSIS — I10 Essential (primary) hypertension: Secondary | ICD-10-CM | POA: Diagnosis not present

## 2015-12-19 DIAGNOSIS — D649 Anemia, unspecified: Secondary | ICD-10-CM | POA: Diagnosis not present

## 2015-12-28 DIAGNOSIS — K5900 Constipation, unspecified: Secondary | ICD-10-CM | POA: Diagnosis not present

## 2015-12-28 DIAGNOSIS — E1169 Type 2 diabetes mellitus with other specified complication: Secondary | ICD-10-CM | POA: Diagnosis not present

## 2016-01-03 DIAGNOSIS — F329 Major depressive disorder, single episode, unspecified: Secondary | ICD-10-CM | POA: Diagnosis not present

## 2016-01-04 DIAGNOSIS — Z23 Encounter for immunization: Secondary | ICD-10-CM | POA: Diagnosis not present

## 2016-01-08 DIAGNOSIS — D649 Anemia, unspecified: Secondary | ICD-10-CM | POA: Diagnosis not present

## 2016-01-08 DIAGNOSIS — I10 Essential (primary) hypertension: Secondary | ICD-10-CM | POA: Diagnosis not present

## 2016-01-10 DIAGNOSIS — A0472 Enterocolitis due to Clostridium difficile, not specified as recurrent: Secondary | ICD-10-CM | POA: Diagnosis not present

## 2016-02-08 DIAGNOSIS — M79652 Pain in left thigh: Secondary | ICD-10-CM | POA: Diagnosis not present

## 2016-02-08 DIAGNOSIS — M1611 Unilateral primary osteoarthritis, right hip: Secondary | ICD-10-CM | POA: Diagnosis not present

## 2016-02-24 DIAGNOSIS — E1169 Type 2 diabetes mellitus with other specified complication: Secondary | ICD-10-CM | POA: Diagnosis not present

## 2016-02-24 DIAGNOSIS — M1612 Unilateral primary osteoarthritis, left hip: Secondary | ICD-10-CM | POA: Diagnosis not present

## 2016-02-27 DIAGNOSIS — R279 Unspecified lack of coordination: Secondary | ICD-10-CM | POA: Diagnosis not present

## 2016-02-27 DIAGNOSIS — M6281 Muscle weakness (generalized): Secondary | ICD-10-CM | POA: Diagnosis not present

## 2016-02-27 DIAGNOSIS — M6249 Contracture of muscle, multiple sites: Secondary | ICD-10-CM | POA: Diagnosis not present

## 2016-02-28 DIAGNOSIS — R279 Unspecified lack of coordination: Secondary | ICD-10-CM | POA: Diagnosis not present

## 2016-02-28 DIAGNOSIS — M6249 Contracture of muscle, multiple sites: Secondary | ICD-10-CM | POA: Diagnosis not present

## 2016-02-28 DIAGNOSIS — M6281 Muscle weakness (generalized): Secondary | ICD-10-CM | POA: Diagnosis not present

## 2016-02-29 DIAGNOSIS — R279 Unspecified lack of coordination: Secondary | ICD-10-CM | POA: Diagnosis not present

## 2016-02-29 DIAGNOSIS — M6281 Muscle weakness (generalized): Secondary | ICD-10-CM | POA: Diagnosis not present

## 2016-02-29 DIAGNOSIS — M6249 Contracture of muscle, multiple sites: Secondary | ICD-10-CM | POA: Diagnosis not present

## 2016-03-03 DIAGNOSIS — M6249 Contracture of muscle, multiple sites: Secondary | ICD-10-CM | POA: Diagnosis not present

## 2016-03-03 DIAGNOSIS — M6281 Muscle weakness (generalized): Secondary | ICD-10-CM | POA: Diagnosis not present

## 2016-03-03 DIAGNOSIS — R279 Unspecified lack of coordination: Secondary | ICD-10-CM | POA: Diagnosis not present

## 2016-03-04 DIAGNOSIS — R279 Unspecified lack of coordination: Secondary | ICD-10-CM | POA: Diagnosis not present

## 2016-03-04 DIAGNOSIS — M6281 Muscle weakness (generalized): Secondary | ICD-10-CM | POA: Diagnosis not present

## 2016-03-04 DIAGNOSIS — M6249 Contracture of muscle, multiple sites: Secondary | ICD-10-CM | POA: Diagnosis not present

## 2016-03-05 DIAGNOSIS — R279 Unspecified lack of coordination: Secondary | ICD-10-CM | POA: Diagnosis not present

## 2016-03-05 DIAGNOSIS — M6249 Contracture of muscle, multiple sites: Secondary | ICD-10-CM | POA: Diagnosis not present

## 2016-03-05 DIAGNOSIS — M6281 Muscle weakness (generalized): Secondary | ICD-10-CM | POA: Diagnosis not present

## 2016-03-06 DIAGNOSIS — M6281 Muscle weakness (generalized): Secondary | ICD-10-CM | POA: Diagnosis not present

## 2016-03-06 DIAGNOSIS — M6249 Contracture of muscle, multiple sites: Secondary | ICD-10-CM | POA: Diagnosis not present

## 2016-03-06 DIAGNOSIS — R279 Unspecified lack of coordination: Secondary | ICD-10-CM | POA: Diagnosis not present

## 2016-03-07 DIAGNOSIS — M6281 Muscle weakness (generalized): Secondary | ICD-10-CM | POA: Diagnosis not present

## 2016-03-07 DIAGNOSIS — M6249 Contracture of muscle, multiple sites: Secondary | ICD-10-CM | POA: Diagnosis not present

## 2016-03-07 DIAGNOSIS — R279 Unspecified lack of coordination: Secondary | ICD-10-CM | POA: Diagnosis not present

## 2016-03-10 DIAGNOSIS — M6281 Muscle weakness (generalized): Secondary | ICD-10-CM | POA: Diagnosis not present

## 2016-03-10 DIAGNOSIS — R279 Unspecified lack of coordination: Secondary | ICD-10-CM | POA: Diagnosis not present

## 2016-03-10 DIAGNOSIS — M6249 Contracture of muscle, multiple sites: Secondary | ICD-10-CM | POA: Diagnosis not present

## 2016-03-11 DIAGNOSIS — R279 Unspecified lack of coordination: Secondary | ICD-10-CM | POA: Diagnosis not present

## 2016-03-11 DIAGNOSIS — M6281 Muscle weakness (generalized): Secondary | ICD-10-CM | POA: Diagnosis not present

## 2016-03-11 DIAGNOSIS — M6249 Contracture of muscle, multiple sites: Secondary | ICD-10-CM | POA: Diagnosis not present

## 2016-03-12 DIAGNOSIS — R279 Unspecified lack of coordination: Secondary | ICD-10-CM | POA: Diagnosis not present

## 2016-03-12 DIAGNOSIS — M6281 Muscle weakness (generalized): Secondary | ICD-10-CM | POA: Diagnosis not present

## 2016-03-12 DIAGNOSIS — M6249 Contracture of muscle, multiple sites: Secondary | ICD-10-CM | POA: Diagnosis not present

## 2016-03-13 DIAGNOSIS — M6249 Contracture of muscle, multiple sites: Secondary | ICD-10-CM | POA: Diagnosis not present

## 2016-03-13 DIAGNOSIS — M6281 Muscle weakness (generalized): Secondary | ICD-10-CM | POA: Diagnosis not present

## 2016-03-13 DIAGNOSIS — R279 Unspecified lack of coordination: Secondary | ICD-10-CM | POA: Diagnosis not present

## 2016-03-14 DIAGNOSIS — R279 Unspecified lack of coordination: Secondary | ICD-10-CM | POA: Diagnosis not present

## 2016-03-14 DIAGNOSIS — M6281 Muscle weakness (generalized): Secondary | ICD-10-CM | POA: Diagnosis not present

## 2016-03-14 DIAGNOSIS — M6249 Contracture of muscle, multiple sites: Secondary | ICD-10-CM | POA: Diagnosis not present

## 2016-03-17 DIAGNOSIS — M6281 Muscle weakness (generalized): Secondary | ICD-10-CM | POA: Diagnosis not present

## 2016-03-17 DIAGNOSIS — M6249 Contracture of muscle, multiple sites: Secondary | ICD-10-CM | POA: Diagnosis not present

## 2016-03-17 DIAGNOSIS — R279 Unspecified lack of coordination: Secondary | ICD-10-CM | POA: Diagnosis not present

## 2016-03-18 DIAGNOSIS — R279 Unspecified lack of coordination: Secondary | ICD-10-CM | POA: Diagnosis not present

## 2016-03-18 DIAGNOSIS — M6249 Contracture of muscle, multiple sites: Secondary | ICD-10-CM | POA: Diagnosis not present

## 2016-03-18 DIAGNOSIS — M6281 Muscle weakness (generalized): Secondary | ICD-10-CM | POA: Diagnosis not present

## 2016-03-19 DIAGNOSIS — R279 Unspecified lack of coordination: Secondary | ICD-10-CM | POA: Diagnosis not present

## 2016-03-19 DIAGNOSIS — M6249 Contracture of muscle, multiple sites: Secondary | ICD-10-CM | POA: Diagnosis not present

## 2016-03-19 DIAGNOSIS — M6281 Muscle weakness (generalized): Secondary | ICD-10-CM | POA: Diagnosis not present

## 2016-03-20 DIAGNOSIS — M6249 Contracture of muscle, multiple sites: Secondary | ICD-10-CM | POA: Diagnosis not present

## 2016-03-20 DIAGNOSIS — R279 Unspecified lack of coordination: Secondary | ICD-10-CM | POA: Diagnosis not present

## 2016-03-20 DIAGNOSIS — M6281 Muscle weakness (generalized): Secondary | ICD-10-CM | POA: Diagnosis not present

## 2016-03-21 DIAGNOSIS — R279 Unspecified lack of coordination: Secondary | ICD-10-CM | POA: Diagnosis not present

## 2016-03-21 DIAGNOSIS — M6249 Contracture of muscle, multiple sites: Secondary | ICD-10-CM | POA: Diagnosis not present

## 2016-03-21 DIAGNOSIS — I10 Essential (primary) hypertension: Secondary | ICD-10-CM | POA: Diagnosis not present

## 2016-03-21 DIAGNOSIS — M6281 Muscle weakness (generalized): Secondary | ICD-10-CM | POA: Diagnosis not present

## 2016-03-25 DIAGNOSIS — M6249 Contracture of muscle, multiple sites: Secondary | ICD-10-CM | POA: Diagnosis not present

## 2016-03-25 DIAGNOSIS — R279 Unspecified lack of coordination: Secondary | ICD-10-CM | POA: Diagnosis not present

## 2016-03-25 DIAGNOSIS — M6281 Muscle weakness (generalized): Secondary | ICD-10-CM | POA: Diagnosis not present

## 2016-03-26 DIAGNOSIS — M6281 Muscle weakness (generalized): Secondary | ICD-10-CM | POA: Diagnosis not present

## 2016-03-26 DIAGNOSIS — R279 Unspecified lack of coordination: Secondary | ICD-10-CM | POA: Diagnosis not present

## 2016-03-26 DIAGNOSIS — M6249 Contracture of muscle, multiple sites: Secondary | ICD-10-CM | POA: Diagnosis not present

## 2016-03-27 DIAGNOSIS — M6281 Muscle weakness (generalized): Secondary | ICD-10-CM | POA: Diagnosis not present

## 2016-03-27 DIAGNOSIS — M6249 Contracture of muscle, multiple sites: Secondary | ICD-10-CM | POA: Diagnosis not present

## 2016-03-27 DIAGNOSIS — R279 Unspecified lack of coordination: Secondary | ICD-10-CM | POA: Diagnosis not present

## 2016-03-28 DIAGNOSIS — R279 Unspecified lack of coordination: Secondary | ICD-10-CM | POA: Diagnosis not present

## 2016-03-28 DIAGNOSIS — M6281 Muscle weakness (generalized): Secondary | ICD-10-CM | POA: Diagnosis not present

## 2016-03-28 DIAGNOSIS — M6249 Contracture of muscle, multiple sites: Secondary | ICD-10-CM | POA: Diagnosis not present

## 2016-04-02 DIAGNOSIS — R262 Difficulty in walking, not elsewhere classified: Secondary | ICD-10-CM | POA: Diagnosis not present

## 2016-04-02 DIAGNOSIS — M6249 Contracture of muscle, multiple sites: Secondary | ICD-10-CM | POA: Diagnosis not present

## 2016-04-02 DIAGNOSIS — R279 Unspecified lack of coordination: Secondary | ICD-10-CM | POA: Diagnosis not present

## 2016-04-02 DIAGNOSIS — M6281 Muscle weakness (generalized): Secondary | ICD-10-CM | POA: Diagnosis not present

## 2016-04-02 DIAGNOSIS — Z89511 Acquired absence of right leg below knee: Secondary | ICD-10-CM | POA: Diagnosis not present

## 2016-04-03 DIAGNOSIS — M6249 Contracture of muscle, multiple sites: Secondary | ICD-10-CM | POA: Diagnosis not present

## 2016-04-03 DIAGNOSIS — R279 Unspecified lack of coordination: Secondary | ICD-10-CM | POA: Diagnosis not present

## 2016-04-03 DIAGNOSIS — M6281 Muscle weakness (generalized): Secondary | ICD-10-CM | POA: Diagnosis not present

## 2016-04-03 DIAGNOSIS — Z89511 Acquired absence of right leg below knee: Secondary | ICD-10-CM | POA: Diagnosis not present

## 2016-04-03 DIAGNOSIS — R262 Difficulty in walking, not elsewhere classified: Secondary | ICD-10-CM | POA: Diagnosis not present

## 2016-04-04 DIAGNOSIS — Z89511 Acquired absence of right leg below knee: Secondary | ICD-10-CM | POA: Diagnosis not present

## 2016-04-04 DIAGNOSIS — R262 Difficulty in walking, not elsewhere classified: Secondary | ICD-10-CM | POA: Diagnosis not present

## 2016-04-04 DIAGNOSIS — R279 Unspecified lack of coordination: Secondary | ICD-10-CM | POA: Diagnosis not present

## 2016-04-04 DIAGNOSIS — M6281 Muscle weakness (generalized): Secondary | ICD-10-CM | POA: Diagnosis not present

## 2016-04-04 DIAGNOSIS — M6249 Contracture of muscle, multiple sites: Secondary | ICD-10-CM | POA: Diagnosis not present

## 2016-04-05 DIAGNOSIS — Z89511 Acquired absence of right leg below knee: Secondary | ICD-10-CM | POA: Diagnosis not present

## 2016-04-05 DIAGNOSIS — M6249 Contracture of muscle, multiple sites: Secondary | ICD-10-CM | POA: Diagnosis not present

## 2016-04-05 DIAGNOSIS — R262 Difficulty in walking, not elsewhere classified: Secondary | ICD-10-CM | POA: Diagnosis not present

## 2016-04-05 DIAGNOSIS — R279 Unspecified lack of coordination: Secondary | ICD-10-CM | POA: Diagnosis not present

## 2016-04-05 DIAGNOSIS — M6281 Muscle weakness (generalized): Secondary | ICD-10-CM | POA: Diagnosis not present

## 2016-04-07 DIAGNOSIS — R279 Unspecified lack of coordination: Secondary | ICD-10-CM | POA: Diagnosis not present

## 2016-04-07 DIAGNOSIS — M6281 Muscle weakness (generalized): Secondary | ICD-10-CM | POA: Diagnosis not present

## 2016-04-07 DIAGNOSIS — Z89511 Acquired absence of right leg below knee: Secondary | ICD-10-CM | POA: Diagnosis not present

## 2016-04-07 DIAGNOSIS — R262 Difficulty in walking, not elsewhere classified: Secondary | ICD-10-CM | POA: Diagnosis not present

## 2016-04-07 DIAGNOSIS — M6249 Contracture of muscle, multiple sites: Secondary | ICD-10-CM | POA: Diagnosis not present

## 2016-04-08 DIAGNOSIS — R262 Difficulty in walking, not elsewhere classified: Secondary | ICD-10-CM | POA: Diagnosis not present

## 2016-04-08 DIAGNOSIS — R279 Unspecified lack of coordination: Secondary | ICD-10-CM | POA: Diagnosis not present

## 2016-04-08 DIAGNOSIS — Z89511 Acquired absence of right leg below knee: Secondary | ICD-10-CM | POA: Diagnosis not present

## 2016-04-08 DIAGNOSIS — M6249 Contracture of muscle, multiple sites: Secondary | ICD-10-CM | POA: Diagnosis not present

## 2016-04-08 DIAGNOSIS — M6281 Muscle weakness (generalized): Secondary | ICD-10-CM | POA: Diagnosis not present

## 2016-04-09 DIAGNOSIS — M6249 Contracture of muscle, multiple sites: Secondary | ICD-10-CM | POA: Diagnosis not present

## 2016-04-09 DIAGNOSIS — R262 Difficulty in walking, not elsewhere classified: Secondary | ICD-10-CM | POA: Diagnosis not present

## 2016-04-09 DIAGNOSIS — M6281 Muscle weakness (generalized): Secondary | ICD-10-CM | POA: Diagnosis not present

## 2016-04-09 DIAGNOSIS — R279 Unspecified lack of coordination: Secondary | ICD-10-CM | POA: Diagnosis not present

## 2016-04-09 DIAGNOSIS — Z89511 Acquired absence of right leg below knee: Secondary | ICD-10-CM | POA: Diagnosis not present

## 2016-04-10 DIAGNOSIS — M6249 Contracture of muscle, multiple sites: Secondary | ICD-10-CM | POA: Diagnosis not present

## 2016-04-10 DIAGNOSIS — R279 Unspecified lack of coordination: Secondary | ICD-10-CM | POA: Diagnosis not present

## 2016-04-10 DIAGNOSIS — M6281 Muscle weakness (generalized): Secondary | ICD-10-CM | POA: Diagnosis not present

## 2016-04-10 DIAGNOSIS — Z89511 Acquired absence of right leg below knee: Secondary | ICD-10-CM | POA: Diagnosis not present

## 2016-04-10 DIAGNOSIS — R262 Difficulty in walking, not elsewhere classified: Secondary | ICD-10-CM | POA: Diagnosis not present

## 2016-04-11 DIAGNOSIS — M6249 Contracture of muscle, multiple sites: Secondary | ICD-10-CM | POA: Diagnosis not present

## 2016-04-11 DIAGNOSIS — M6281 Muscle weakness (generalized): Secondary | ICD-10-CM | POA: Diagnosis not present

## 2016-04-11 DIAGNOSIS — R262 Difficulty in walking, not elsewhere classified: Secondary | ICD-10-CM | POA: Diagnosis not present

## 2016-04-11 DIAGNOSIS — R279 Unspecified lack of coordination: Secondary | ICD-10-CM | POA: Diagnosis not present

## 2016-04-11 DIAGNOSIS — Z89511 Acquired absence of right leg below knee: Secondary | ICD-10-CM | POA: Diagnosis not present

## 2016-04-14 DIAGNOSIS — M6281 Muscle weakness (generalized): Secondary | ICD-10-CM | POA: Diagnosis not present

## 2016-04-14 DIAGNOSIS — F329 Major depressive disorder, single episode, unspecified: Secondary | ICD-10-CM | POA: Diagnosis not present

## 2016-04-14 DIAGNOSIS — M6249 Contracture of muscle, multiple sites: Secondary | ICD-10-CM | POA: Diagnosis not present

## 2016-04-14 DIAGNOSIS — R279 Unspecified lack of coordination: Secondary | ICD-10-CM | POA: Diagnosis not present

## 2016-04-14 DIAGNOSIS — Z89511 Acquired absence of right leg below knee: Secondary | ICD-10-CM | POA: Diagnosis not present

## 2016-04-14 DIAGNOSIS — F39 Unspecified mood [affective] disorder: Secondary | ICD-10-CM | POA: Diagnosis not present

## 2016-04-14 DIAGNOSIS — R262 Difficulty in walking, not elsewhere classified: Secondary | ICD-10-CM | POA: Diagnosis not present

## 2016-04-15 DIAGNOSIS — N950 Postmenopausal bleeding: Secondary | ICD-10-CM | POA: Diagnosis not present

## 2016-04-15 DIAGNOSIS — Z89511 Acquired absence of right leg below knee: Secondary | ICD-10-CM | POA: Diagnosis not present

## 2016-04-15 DIAGNOSIS — A419 Sepsis, unspecified organism: Secondary | ICD-10-CM | POA: Diagnosis not present

## 2016-04-15 DIAGNOSIS — R069 Unspecified abnormalities of breathing: Secondary | ICD-10-CM | POA: Diagnosis not present

## 2016-04-15 DIAGNOSIS — F329 Major depressive disorder, single episode, unspecified: Secondary | ICD-10-CM | POA: Diagnosis not present

## 2016-04-15 DIAGNOSIS — R41841 Cognitive communication deficit: Secondary | ICD-10-CM | POA: Diagnosis not present

## 2016-04-15 DIAGNOSIS — A4151 Sepsis due to Escherichia coli [E. coli]: Secondary | ICD-10-CM | POA: Diagnosis present

## 2016-04-15 DIAGNOSIS — R2689 Other abnormalities of gait and mobility: Secondary | ICD-10-CM | POA: Diagnosis not present

## 2016-04-15 DIAGNOSIS — I10 Essential (primary) hypertension: Secondary | ICD-10-CM | POA: Diagnosis not present

## 2016-04-15 DIAGNOSIS — D509 Iron deficiency anemia, unspecified: Secondary | ICD-10-CM | POA: Diagnosis not present

## 2016-04-15 DIAGNOSIS — N840 Polyp of corpus uteri: Secondary | ICD-10-CM | POA: Diagnosis not present

## 2016-04-15 DIAGNOSIS — R1312 Dysphagia, oropharyngeal phase: Secondary | ICD-10-CM | POA: Diagnosis not present

## 2016-04-15 DIAGNOSIS — R29818 Other symptoms and signs involving the nervous system: Secondary | ICD-10-CM | POA: Diagnosis not present

## 2016-04-15 DIAGNOSIS — R4182 Altered mental status, unspecified: Secondary | ICD-10-CM | POA: Diagnosis not present

## 2016-04-15 DIAGNOSIS — K219 Gastro-esophageal reflux disease without esophagitis: Secondary | ICD-10-CM | POA: Diagnosis not present

## 2016-04-15 DIAGNOSIS — E1169 Type 2 diabetes mellitus with other specified complication: Secondary | ICD-10-CM | POA: Diagnosis present

## 2016-04-15 DIAGNOSIS — R404 Transient alteration of awareness: Secondary | ICD-10-CM | POA: Diagnosis not present

## 2016-04-15 DIAGNOSIS — Z7401 Bed confinement status: Secondary | ICD-10-CM | POA: Diagnosis not present

## 2016-04-15 DIAGNOSIS — Z89512 Acquired absence of left leg below knee: Secondary | ICD-10-CM | POA: Diagnosis not present

## 2016-04-15 DIAGNOSIS — E1142 Type 2 diabetes mellitus with diabetic polyneuropathy: Secondary | ICD-10-CM | POA: Diagnosis present

## 2016-04-15 DIAGNOSIS — Z9181 History of falling: Secondary | ICD-10-CM | POA: Diagnosis not present

## 2016-04-15 DIAGNOSIS — G894 Chronic pain syndrome: Secondary | ICD-10-CM | POA: Diagnosis not present

## 2016-04-15 DIAGNOSIS — E1140 Type 2 diabetes mellitus with diabetic neuropathy, unspecified: Secondary | ICD-10-CM | POA: Diagnosis not present

## 2016-04-15 DIAGNOSIS — R4189 Other symptoms and signs involving cognitive functions and awareness: Secondary | ICD-10-CM | POA: Diagnosis not present

## 2016-04-15 DIAGNOSIS — L89899 Pressure ulcer of other site, unspecified stage: Secondary | ICD-10-CM | POA: Diagnosis present

## 2016-04-15 DIAGNOSIS — R1310 Dysphagia, unspecified: Secondary | ICD-10-CM | POA: Diagnosis present

## 2016-04-15 DIAGNOSIS — Z7982 Long term (current) use of aspirin: Secondary | ICD-10-CM | POA: Diagnosis not present

## 2016-04-15 DIAGNOSIS — N939 Abnormal uterine and vaginal bleeding, unspecified: Secondary | ICD-10-CM | POA: Diagnosis present

## 2016-04-15 DIAGNOSIS — Z961 Presence of intraocular lens: Secondary | ICD-10-CM | POA: Diagnosis not present

## 2016-04-15 DIAGNOSIS — I2510 Atherosclerotic heart disease of native coronary artery without angina pectoris: Secondary | ICD-10-CM | POA: Diagnosis not present

## 2016-04-15 DIAGNOSIS — N3281 Overactive bladder: Secondary | ICD-10-CM | POA: Diagnosis not present

## 2016-04-15 DIAGNOSIS — N390 Urinary tract infection, site not specified: Secondary | ICD-10-CM | POA: Diagnosis not present

## 2016-04-15 DIAGNOSIS — I509 Heart failure, unspecified: Secondary | ICD-10-CM | POA: Diagnosis not present

## 2016-04-15 DIAGNOSIS — F330 Major depressive disorder, recurrent, mild: Secondary | ICD-10-CM | POA: Diagnosis not present

## 2016-04-15 DIAGNOSIS — E119 Type 2 diabetes mellitus without complications: Secondary | ICD-10-CM | POA: Diagnosis not present

## 2016-04-15 DIAGNOSIS — R279 Unspecified lack of coordination: Secondary | ICD-10-CM | POA: Diagnosis not present

## 2016-04-15 DIAGNOSIS — R509 Fever, unspecified: Secondary | ICD-10-CM | POA: Diagnosis not present

## 2016-04-15 DIAGNOSIS — M6281 Muscle weakness (generalized): Secondary | ICD-10-CM | POA: Diagnosis not present

## 2016-04-15 DIAGNOSIS — R0602 Shortness of breath: Secondary | ICD-10-CM | POA: Diagnosis not present

## 2016-04-15 DIAGNOSIS — H43813 Vitreous degeneration, bilateral: Secondary | ICD-10-CM | POA: Diagnosis not present

## 2016-04-15 DIAGNOSIS — H43391 Other vitreous opacities, right eye: Secondary | ICD-10-CM | POA: Diagnosis not present

## 2016-04-15 DIAGNOSIS — T83518A Infection and inflammatory reaction due to other urinary catheter, initial encounter: Secondary | ICD-10-CM | POA: Diagnosis present

## 2016-04-15 DIAGNOSIS — R262 Difficulty in walking, not elsewhere classified: Secondary | ICD-10-CM | POA: Diagnosis not present

## 2016-04-15 DIAGNOSIS — Z6841 Body Mass Index (BMI) 40.0 and over, adult: Secondary | ICD-10-CM | POA: Diagnosis not present

## 2016-04-23 DIAGNOSIS — B961 Klebsiella pneumoniae [K. pneumoniae] as the cause of diseases classified elsewhere: Secondary | ICD-10-CM | POA: Diagnosis present

## 2016-04-23 DIAGNOSIS — L89322 Pressure ulcer of left buttock, stage 2: Secondary | ICD-10-CM | POA: Diagnosis present

## 2016-04-23 DIAGNOSIS — Z86711 Personal history of pulmonary embolism: Secondary | ICD-10-CM | POA: Diagnosis not present

## 2016-04-23 DIAGNOSIS — R0602 Shortness of breath: Secondary | ICD-10-CM | POA: Diagnosis not present

## 2016-04-23 DIAGNOSIS — N939 Abnormal uterine and vaginal bleeding, unspecified: Secondary | ICD-10-CM | POA: Diagnosis not present

## 2016-04-23 DIAGNOSIS — R262 Difficulty in walking, not elsewhere classified: Secondary | ICD-10-CM | POA: Diagnosis not present

## 2016-04-23 DIAGNOSIS — G894 Chronic pain syndrome: Secondary | ICD-10-CM | POA: Diagnosis not present

## 2016-04-23 DIAGNOSIS — I2510 Atherosclerotic heart disease of native coronary artery without angina pectoris: Secondary | ICD-10-CM | POA: Diagnosis not present

## 2016-04-23 DIAGNOSIS — E1151 Type 2 diabetes mellitus with diabetic peripheral angiopathy without gangrene: Secondary | ICD-10-CM | POA: Diagnosis present

## 2016-04-23 DIAGNOSIS — N8502 Endometrial intraepithelial neoplasia [EIN]: Secondary | ICD-10-CM | POA: Diagnosis not present

## 2016-04-23 DIAGNOSIS — N8501 Benign endometrial hyperplasia: Secondary | ICD-10-CM | POA: Diagnosis not present

## 2016-04-23 DIAGNOSIS — F330 Major depressive disorder, recurrent, mild: Secondary | ICD-10-CM | POA: Diagnosis not present

## 2016-04-23 DIAGNOSIS — E875 Hyperkalemia: Secondary | ICD-10-CM | POA: Diagnosis present

## 2016-04-23 DIAGNOSIS — Z8673 Personal history of transient ischemic attack (TIA), and cerebral infarction without residual deficits: Secondary | ICD-10-CM | POA: Diagnosis not present

## 2016-04-23 DIAGNOSIS — J189 Pneumonia, unspecified organism: Secondary | ICD-10-CM | POA: Diagnosis not present

## 2016-04-23 DIAGNOSIS — J150 Pneumonia due to Klebsiella pneumoniae: Secondary | ICD-10-CM | POA: Diagnosis present

## 2016-04-23 DIAGNOSIS — H43822 Vitreomacular adhesion, left eye: Secondary | ICD-10-CM | POA: Diagnosis not present

## 2016-04-23 DIAGNOSIS — E039 Hypothyroidism, unspecified: Secondary | ICD-10-CM | POA: Diagnosis not present

## 2016-04-23 DIAGNOSIS — Z961 Presence of intraocular lens: Secondary | ICD-10-CM | POA: Diagnosis not present

## 2016-04-23 DIAGNOSIS — E113552 Type 2 diabetes mellitus with stable proliferative diabetic retinopathy, left eye: Secondary | ICD-10-CM | POA: Diagnosis not present

## 2016-04-23 DIAGNOSIS — E860 Dehydration: Secondary | ICD-10-CM | POA: Diagnosis present

## 2016-04-23 DIAGNOSIS — F329 Major depressive disorder, single episode, unspecified: Secondary | ICD-10-CM | POA: Diagnosis not present

## 2016-04-23 DIAGNOSIS — E1040 Type 1 diabetes mellitus with diabetic neuropathy, unspecified: Secondary | ICD-10-CM | POA: Diagnosis not present

## 2016-04-23 DIAGNOSIS — Z86718 Personal history of other venous thrombosis and embolism: Secondary | ICD-10-CM | POA: Diagnosis not present

## 2016-04-23 DIAGNOSIS — R4189 Other symptoms and signs involving cognitive functions and awareness: Secondary | ICD-10-CM | POA: Diagnosis not present

## 2016-04-23 DIAGNOSIS — A419 Sepsis, unspecified organism: Secondary | ICD-10-CM | POA: Diagnosis not present

## 2016-04-23 DIAGNOSIS — H43813 Vitreous degeneration, bilateral: Secondary | ICD-10-CM | POA: Diagnosis not present

## 2016-04-23 DIAGNOSIS — R41841 Cognitive communication deficit: Secondary | ICD-10-CM | POA: Diagnosis not present

## 2016-04-23 DIAGNOSIS — Z30430 Encounter for insertion of intrauterine contraceptive device: Secondary | ICD-10-CM | POA: Diagnosis not present

## 2016-04-23 DIAGNOSIS — Z888 Allergy status to other drugs, medicaments and biological substances status: Secondary | ICD-10-CM | POA: Diagnosis not present

## 2016-04-23 DIAGNOSIS — Z7951 Long term (current) use of inhaled steroids: Secondary | ICD-10-CM | POA: Diagnosis not present

## 2016-04-23 DIAGNOSIS — E669 Obesity, unspecified: Secondary | ICD-10-CM | POA: Diagnosis not present

## 2016-04-23 DIAGNOSIS — Z794 Long term (current) use of insulin: Secondary | ICD-10-CM | POA: Diagnosis not present

## 2016-04-23 DIAGNOSIS — I739 Peripheral vascular disease, unspecified: Secondary | ICD-10-CM | POA: Diagnosis not present

## 2016-04-23 DIAGNOSIS — R4182 Altered mental status, unspecified: Secondary | ICD-10-CM | POA: Diagnosis not present

## 2016-04-23 DIAGNOSIS — Z833 Family history of diabetes mellitus: Secondary | ICD-10-CM | POA: Diagnosis not present

## 2016-04-23 DIAGNOSIS — M6281 Muscle weakness (generalized): Secondary | ICD-10-CM | POA: Diagnosis not present

## 2016-04-23 DIAGNOSIS — K219 Gastro-esophageal reflux disease without esophagitis: Secondary | ICD-10-CM | POA: Diagnosis present

## 2016-04-23 DIAGNOSIS — R279 Unspecified lack of coordination: Secondary | ICD-10-CM | POA: Diagnosis not present

## 2016-04-23 DIAGNOSIS — E1140 Type 2 diabetes mellitus with diabetic neuropathy, unspecified: Secondary | ICD-10-CM | POA: Diagnosis present

## 2016-04-23 DIAGNOSIS — Z89511 Acquired absence of right leg below knee: Secondary | ICD-10-CM | POA: Diagnosis not present

## 2016-04-23 DIAGNOSIS — E113511 Type 2 diabetes mellitus with proliferative diabetic retinopathy with macular edema, right eye: Secondary | ICD-10-CM | POA: Diagnosis not present

## 2016-04-23 DIAGNOSIS — F39 Unspecified mood [affective] disorder: Secondary | ICD-10-CM | POA: Diagnosis not present

## 2016-04-23 DIAGNOSIS — R1312 Dysphagia, oropharyngeal phase: Secondary | ICD-10-CM | POA: Diagnosis not present

## 2016-04-23 DIAGNOSIS — R05 Cough: Secondary | ICD-10-CM | POA: Diagnosis not present

## 2016-04-23 DIAGNOSIS — Z7401 Bed confinement status: Secondary | ICD-10-CM | POA: Diagnosis not present

## 2016-04-23 DIAGNOSIS — D509 Iron deficiency anemia, unspecified: Secondary | ICD-10-CM | POA: Diagnosis not present

## 2016-04-23 DIAGNOSIS — R6520 Severe sepsis without septic shock: Secondary | ICD-10-CM | POA: Diagnosis present

## 2016-04-23 DIAGNOSIS — J9601 Acute respiratory failure with hypoxia: Secondary | ICD-10-CM | POA: Diagnosis present

## 2016-04-23 DIAGNOSIS — N950 Postmenopausal bleeding: Secondary | ICD-10-CM | POA: Diagnosis not present

## 2016-04-23 DIAGNOSIS — H409 Unspecified glaucoma: Secondary | ICD-10-CM | POA: Diagnosis present

## 2016-04-23 DIAGNOSIS — N3281 Overactive bladder: Secondary | ICD-10-CM | POA: Diagnosis not present

## 2016-04-23 DIAGNOSIS — N390 Urinary tract infection, site not specified: Secondary | ICD-10-CM | POA: Diagnosis present

## 2016-04-23 DIAGNOSIS — N179 Acute kidney failure, unspecified: Secondary | ICD-10-CM | POA: Diagnosis present

## 2016-04-23 DIAGNOSIS — I10 Essential (primary) hypertension: Secondary | ICD-10-CM | POA: Diagnosis present

## 2016-04-23 DIAGNOSIS — H35043 Retinal micro-aneurysms, unspecified, bilateral: Secondary | ICD-10-CM | POA: Diagnosis not present

## 2016-04-23 DIAGNOSIS — R0902 Hypoxemia: Secondary | ICD-10-CM | POA: Diagnosis not present

## 2016-04-23 DIAGNOSIS — Z89512 Acquired absence of left leg below knee: Secondary | ICD-10-CM | POA: Diagnosis not present

## 2016-04-23 DIAGNOSIS — R2689 Other abnormalities of gait and mobility: Secondary | ICD-10-CM | POA: Diagnosis not present

## 2016-04-23 DIAGNOSIS — I509 Heart failure, unspecified: Secondary | ICD-10-CM | POA: Diagnosis not present

## 2016-04-23 DIAGNOSIS — N938 Other specified abnormal uterine and vaginal bleeding: Secondary | ICD-10-CM | POA: Diagnosis not present

## 2016-04-23 DIAGNOSIS — Z6841 Body Mass Index (BMI) 40.0 and over, adult: Secondary | ICD-10-CM | POA: Diagnosis not present

## 2016-04-23 DIAGNOSIS — Z743 Need for continuous supervision: Secondary | ICD-10-CM | POA: Diagnosis not present

## 2016-04-23 DIAGNOSIS — R319 Hematuria, unspecified: Secondary | ICD-10-CM | POA: Diagnosis present

## 2016-04-23 DIAGNOSIS — H43391 Other vitreous opacities, right eye: Secondary | ICD-10-CM | POA: Diagnosis not present

## 2016-04-23 DIAGNOSIS — A4189 Other specified sepsis: Secondary | ICD-10-CM | POA: Diagnosis present

## 2016-04-24 DIAGNOSIS — N390 Urinary tract infection, site not specified: Secondary | ICD-10-CM | POA: Diagnosis not present

## 2016-04-24 DIAGNOSIS — N938 Other specified abnormal uterine and vaginal bleeding: Secondary | ICD-10-CM | POA: Diagnosis not present

## 2016-05-05 DIAGNOSIS — F39 Unspecified mood [affective] disorder: Secondary | ICD-10-CM | POA: Diagnosis not present

## 2016-05-05 DIAGNOSIS — F329 Major depressive disorder, single episode, unspecified: Secondary | ICD-10-CM | POA: Diagnosis not present

## 2016-05-20 DIAGNOSIS — Z30430 Encounter for insertion of intrauterine contraceptive device: Secondary | ICD-10-CM | POA: Diagnosis not present

## 2016-05-20 DIAGNOSIS — F329 Major depressive disorder, single episode, unspecified: Secondary | ICD-10-CM | POA: Diagnosis not present

## 2016-05-20 DIAGNOSIS — N8502 Endometrial intraepithelial neoplasia [EIN]: Secondary | ICD-10-CM | POA: Diagnosis not present

## 2016-05-20 DIAGNOSIS — Z8673 Personal history of transient ischemic attack (TIA), and cerebral infarction without residual deficits: Secondary | ICD-10-CM | POA: Diagnosis not present

## 2016-05-20 DIAGNOSIS — K219 Gastro-esophageal reflux disease without esophagitis: Secondary | ICD-10-CM | POA: Diagnosis not present

## 2016-05-20 DIAGNOSIS — I10 Essential (primary) hypertension: Secondary | ICD-10-CM | POA: Diagnosis not present

## 2016-05-20 DIAGNOSIS — N950 Postmenopausal bleeding: Secondary | ICD-10-CM | POA: Diagnosis not present

## 2016-05-20 DIAGNOSIS — E669 Obesity, unspecified: Secondary | ICD-10-CM | POA: Diagnosis not present

## 2016-05-20 DIAGNOSIS — Z89511 Acquired absence of right leg below knee: Secondary | ICD-10-CM | POA: Diagnosis not present

## 2016-05-20 DIAGNOSIS — E1040 Type 1 diabetes mellitus with diabetic neuropathy, unspecified: Secondary | ICD-10-CM | POA: Diagnosis not present

## 2016-05-20 DIAGNOSIS — Z89512 Acquired absence of left leg below knee: Secondary | ICD-10-CM | POA: Diagnosis not present

## 2016-05-20 DIAGNOSIS — N8501 Benign endometrial hyperplasia: Secondary | ICD-10-CM | POA: Diagnosis not present

## 2016-05-20 DIAGNOSIS — I739 Peripheral vascular disease, unspecified: Secondary | ICD-10-CM | POA: Diagnosis not present

## 2016-05-28 DIAGNOSIS — H43822 Vitreomacular adhesion, left eye: Secondary | ICD-10-CM | POA: Diagnosis not present

## 2016-05-28 DIAGNOSIS — E113511 Type 2 diabetes mellitus with proliferative diabetic retinopathy with macular edema, right eye: Secondary | ICD-10-CM | POA: Diagnosis not present

## 2016-05-28 DIAGNOSIS — E113552 Type 2 diabetes mellitus with stable proliferative diabetic retinopathy, left eye: Secondary | ICD-10-CM | POA: Diagnosis not present

## 2016-05-28 DIAGNOSIS — H35043 Retinal micro-aneurysms, unspecified, bilateral: Secondary | ICD-10-CM | POA: Diagnosis not present

## 2016-06-04 DIAGNOSIS — R2689 Other abnormalities of gait and mobility: Secondary | ICD-10-CM | POA: Diagnosis not present

## 2016-06-04 DIAGNOSIS — K219 Gastro-esophageal reflux disease without esophagitis: Secondary | ICD-10-CM | POA: Diagnosis present

## 2016-06-04 DIAGNOSIS — E1100 Type 2 diabetes mellitus with hyperosmolarity without nonketotic hyperglycemic-hyperosmolar coma (NKHHC): Secondary | ICD-10-CM | POA: Diagnosis not present

## 2016-06-04 DIAGNOSIS — J189 Pneumonia, unspecified organism: Secondary | ICD-10-CM | POA: Diagnosis not present

## 2016-06-04 DIAGNOSIS — I10 Essential (primary) hypertension: Secondary | ICD-10-CM | POA: Diagnosis present

## 2016-06-04 DIAGNOSIS — R05 Cough: Secondary | ICD-10-CM | POA: Diagnosis not present

## 2016-06-04 DIAGNOSIS — R6520 Severe sepsis without septic shock: Secondary | ICD-10-CM | POA: Diagnosis present

## 2016-06-04 DIAGNOSIS — B961 Klebsiella pneumoniae [K. pneumoniae] as the cause of diseases classified elsewhere: Secondary | ICD-10-CM | POA: Diagnosis present

## 2016-06-04 DIAGNOSIS — N179 Acute kidney failure, unspecified: Secondary | ICD-10-CM | POA: Diagnosis not present

## 2016-06-04 DIAGNOSIS — J150 Pneumonia due to Klebsiella pneumoniae: Secondary | ICD-10-CM | POA: Diagnosis not present

## 2016-06-04 DIAGNOSIS — R4182 Altered mental status, unspecified: Secondary | ICD-10-CM | POA: Diagnosis not present

## 2016-06-04 DIAGNOSIS — F330 Major depressive disorder, recurrent, mild: Secondary | ICD-10-CM | POA: Diagnosis not present

## 2016-06-04 DIAGNOSIS — I509 Heart failure, unspecified: Secondary | ICD-10-CM | POA: Diagnosis not present

## 2016-06-04 DIAGNOSIS — N3281 Overactive bladder: Secondary | ICD-10-CM | POA: Diagnosis not present

## 2016-06-04 DIAGNOSIS — I2510 Atherosclerotic heart disease of native coronary artery without angina pectoris: Secondary | ICD-10-CM | POA: Diagnosis not present

## 2016-06-04 DIAGNOSIS — H43391 Other vitreous opacities, right eye: Secondary | ICD-10-CM | POA: Diagnosis not present

## 2016-06-04 DIAGNOSIS — Z7401 Bed confinement status: Secondary | ICD-10-CM | POA: Diagnosis not present

## 2016-06-04 DIAGNOSIS — Z89511 Acquired absence of right leg below knee: Secondary | ICD-10-CM | POA: Diagnosis not present

## 2016-06-04 DIAGNOSIS — E083513 Diabetes mellitus due to underlying condition with proliferative diabetic retinopathy with macular edema, bilateral: Secondary | ICD-10-CM | POA: Diagnosis not present

## 2016-06-04 DIAGNOSIS — L89322 Pressure ulcer of left buttock, stage 2: Secondary | ICD-10-CM | POA: Diagnosis present

## 2016-06-04 DIAGNOSIS — Z7951 Long term (current) use of inhaled steroids: Secondary | ICD-10-CM | POA: Diagnosis not present

## 2016-06-04 DIAGNOSIS — Z833 Family history of diabetes mellitus: Secondary | ICD-10-CM | POA: Diagnosis not present

## 2016-06-04 DIAGNOSIS — R41841 Cognitive communication deficit: Secondary | ICD-10-CM | POA: Diagnosis not present

## 2016-06-04 DIAGNOSIS — D509 Iron deficiency anemia, unspecified: Secondary | ICD-10-CM | POA: Diagnosis not present

## 2016-06-04 DIAGNOSIS — H409 Unspecified glaucoma: Secondary | ICD-10-CM | POA: Diagnosis present

## 2016-06-04 DIAGNOSIS — Z8673 Personal history of transient ischemic attack (TIA), and cerebral infarction without residual deficits: Secondary | ICD-10-CM | POA: Diagnosis not present

## 2016-06-04 DIAGNOSIS — A4189 Other specified sepsis: Secondary | ICD-10-CM | POA: Diagnosis not present

## 2016-06-04 DIAGNOSIS — R279 Unspecified lack of coordination: Secondary | ICD-10-CM | POA: Diagnosis not present

## 2016-06-04 DIAGNOSIS — G894 Chronic pain syndrome: Secondary | ICD-10-CM | POA: Diagnosis not present

## 2016-06-04 DIAGNOSIS — F329 Major depressive disorder, single episode, unspecified: Secondary | ICD-10-CM | POA: Diagnosis not present

## 2016-06-04 DIAGNOSIS — E860 Dehydration: Secondary | ICD-10-CM | POA: Diagnosis not present

## 2016-06-04 DIAGNOSIS — E1140 Type 2 diabetes mellitus with diabetic neuropathy, unspecified: Secondary | ICD-10-CM | POA: Diagnosis present

## 2016-06-04 DIAGNOSIS — R1312 Dysphagia, oropharyngeal phase: Secondary | ICD-10-CM | POA: Diagnosis not present

## 2016-06-04 DIAGNOSIS — Z961 Presence of intraocular lens: Secondary | ICD-10-CM | POA: Diagnosis not present

## 2016-06-04 DIAGNOSIS — A419 Sepsis, unspecified organism: Secondary | ICD-10-CM | POA: Diagnosis not present

## 2016-06-04 DIAGNOSIS — N390 Urinary tract infection, site not specified: Secondary | ICD-10-CM | POA: Diagnosis not present

## 2016-06-04 DIAGNOSIS — Z89512 Acquired absence of left leg below knee: Secondary | ICD-10-CM | POA: Diagnosis not present

## 2016-06-04 DIAGNOSIS — R262 Difficulty in walking, not elsewhere classified: Secondary | ICD-10-CM | POA: Diagnosis not present

## 2016-06-04 DIAGNOSIS — E875 Hyperkalemia: Secondary | ICD-10-CM | POA: Diagnosis not present

## 2016-06-04 DIAGNOSIS — R319 Hematuria, unspecified: Secondary | ICD-10-CM | POA: Diagnosis present

## 2016-06-04 DIAGNOSIS — N189 Chronic kidney disease, unspecified: Secondary | ICD-10-CM | POA: Diagnosis not present

## 2016-06-04 DIAGNOSIS — R0602 Shortness of breath: Secondary | ICD-10-CM | POA: Diagnosis not present

## 2016-06-04 DIAGNOSIS — Z6841 Body Mass Index (BMI) 40.0 and over, adult: Secondary | ICD-10-CM | POA: Diagnosis not present

## 2016-06-04 DIAGNOSIS — Z888 Allergy status to other drugs, medicaments and biological substances status: Secondary | ICD-10-CM | POA: Diagnosis not present

## 2016-06-04 DIAGNOSIS — R4189 Other symptoms and signs involving cognitive functions and awareness: Secondary | ICD-10-CM | POA: Diagnosis not present

## 2016-06-04 DIAGNOSIS — E1151 Type 2 diabetes mellitus with diabetic peripheral angiopathy without gangrene: Secondary | ICD-10-CM | POA: Diagnosis present

## 2016-06-04 DIAGNOSIS — J9601 Acute respiratory failure with hypoxia: Secondary | ICD-10-CM | POA: Diagnosis not present

## 2016-06-04 DIAGNOSIS — Z794 Long term (current) use of insulin: Secondary | ICD-10-CM | POA: Diagnosis not present

## 2016-06-04 DIAGNOSIS — R0902 Hypoxemia: Secondary | ICD-10-CM | POA: Diagnosis not present

## 2016-06-04 DIAGNOSIS — H28 Cataract in diseases classified elsewhere: Secondary | ICD-10-CM | POA: Diagnosis not present

## 2016-06-09 DIAGNOSIS — A419 Sepsis, unspecified organism: Secondary | ICD-10-CM | POA: Diagnosis not present

## 2016-06-09 DIAGNOSIS — R4182 Altered mental status, unspecified: Secondary | ICD-10-CM | POA: Diagnosis not present

## 2016-06-09 DIAGNOSIS — E1122 Type 2 diabetes mellitus with diabetic chronic kidney disease: Secondary | ICD-10-CM | POA: Diagnosis not present

## 2016-06-09 DIAGNOSIS — Z794 Long term (current) use of insulin: Secondary | ICD-10-CM | POA: Diagnosis not present

## 2016-06-09 DIAGNOSIS — N189 Chronic kidney disease, unspecified: Secondary | ICD-10-CM | POA: Diagnosis not present

## 2016-06-09 DIAGNOSIS — R4189 Other symptoms and signs involving cognitive functions and awareness: Secondary | ICD-10-CM | POA: Diagnosis not present

## 2016-06-09 DIAGNOSIS — E083513 Diabetes mellitus due to underlying condition with proliferative diabetic retinopathy with macular edema, bilateral: Secondary | ICD-10-CM | POA: Diagnosis not present

## 2016-06-09 DIAGNOSIS — R279 Unspecified lack of coordination: Secondary | ICD-10-CM | POA: Diagnosis not present

## 2016-06-09 DIAGNOSIS — R739 Hyperglycemia, unspecified: Secondary | ICD-10-CM | POA: Diagnosis not present

## 2016-06-09 DIAGNOSIS — G894 Chronic pain syndrome: Secondary | ICD-10-CM | POA: Diagnosis not present

## 2016-06-09 DIAGNOSIS — I129 Hypertensive chronic kidney disease with stage 1 through stage 4 chronic kidney disease, or unspecified chronic kidney disease: Secondary | ICD-10-CM | POA: Diagnosis not present

## 2016-06-09 DIAGNOSIS — Z7951 Long term (current) use of inhaled steroids: Secondary | ICD-10-CM | POA: Diagnosis not present

## 2016-06-09 DIAGNOSIS — Z89511 Acquired absence of right leg below knee: Secondary | ICD-10-CM | POA: Diagnosis not present

## 2016-06-09 DIAGNOSIS — K219 Gastro-esophageal reflux disease without esophagitis: Secondary | ICD-10-CM | POA: Diagnosis not present

## 2016-06-09 DIAGNOSIS — I2510 Atherosclerotic heart disease of native coronary artery without angina pectoris: Secondary | ICD-10-CM | POA: Diagnosis not present

## 2016-06-09 DIAGNOSIS — N390 Urinary tract infection, site not specified: Secondary | ICD-10-CM | POA: Diagnosis not present

## 2016-06-09 DIAGNOSIS — R1312 Dysphagia, oropharyngeal phase: Secondary | ICD-10-CM | POA: Diagnosis not present

## 2016-06-09 DIAGNOSIS — Z961 Presence of intraocular lens: Secondary | ICD-10-CM | POA: Diagnosis not present

## 2016-06-09 DIAGNOSIS — E1100 Type 2 diabetes mellitus with hyperosmolarity without nonketotic hyperglycemic-hyperosmolar coma (NKHHC): Secondary | ICD-10-CM | POA: Diagnosis not present

## 2016-06-09 DIAGNOSIS — H43391 Other vitreous opacities, right eye: Secondary | ICD-10-CM | POA: Diagnosis not present

## 2016-06-09 DIAGNOSIS — Z888 Allergy status to other drugs, medicaments and biological substances status: Secondary | ICD-10-CM | POA: Diagnosis not present

## 2016-06-09 DIAGNOSIS — H28 Cataract in diseases classified elsewhere: Secondary | ICD-10-CM | POA: Diagnosis not present

## 2016-06-09 DIAGNOSIS — E1065 Type 1 diabetes mellitus with hyperglycemia: Secondary | ICD-10-CM | POA: Diagnosis not present

## 2016-06-09 DIAGNOSIS — F329 Major depressive disorder, single episode, unspecified: Secondary | ICD-10-CM | POA: Diagnosis not present

## 2016-06-09 DIAGNOSIS — E860 Dehydration: Secondary | ICD-10-CM | POA: Diagnosis not present

## 2016-06-09 DIAGNOSIS — R41841 Cognitive communication deficit: Secondary | ICD-10-CM | POA: Diagnosis not present

## 2016-06-09 DIAGNOSIS — Z8673 Personal history of transient ischemic attack (TIA), and cerebral infarction without residual deficits: Secondary | ICD-10-CM | POA: Diagnosis not present

## 2016-06-09 DIAGNOSIS — N938 Other specified abnormal uterine and vaginal bleeding: Secondary | ICD-10-CM | POA: Diagnosis not present

## 2016-06-09 DIAGNOSIS — I509 Heart failure, unspecified: Secondary | ICD-10-CM | POA: Diagnosis not present

## 2016-06-09 DIAGNOSIS — F39 Unspecified mood [affective] disorder: Secondary | ICD-10-CM | POA: Diagnosis not present

## 2016-06-09 DIAGNOSIS — R262 Difficulty in walking, not elsewhere classified: Secondary | ICD-10-CM | POA: Diagnosis not present

## 2016-06-09 DIAGNOSIS — R918 Other nonspecific abnormal finding of lung field: Secondary | ICD-10-CM | POA: Diagnosis not present

## 2016-06-09 DIAGNOSIS — R030 Elevated blood-pressure reading, without diagnosis of hypertension: Secondary | ICD-10-CM | POA: Diagnosis not present

## 2016-06-09 DIAGNOSIS — D508 Other iron deficiency anemias: Secondary | ICD-10-CM | POA: Diagnosis not present

## 2016-06-09 DIAGNOSIS — E875 Hyperkalemia: Secondary | ICD-10-CM | POA: Diagnosis not present

## 2016-06-09 DIAGNOSIS — R0602 Shortness of breath: Secondary | ICD-10-CM | POA: Diagnosis not present

## 2016-06-09 DIAGNOSIS — F330 Major depressive disorder, recurrent, mild: Secondary | ICD-10-CM | POA: Diagnosis not present

## 2016-06-09 DIAGNOSIS — D649 Anemia, unspecified: Secondary | ICD-10-CM | POA: Diagnosis not present

## 2016-06-09 DIAGNOSIS — E871 Hypo-osmolality and hyponatremia: Secondary | ICD-10-CM | POA: Diagnosis not present

## 2016-06-09 DIAGNOSIS — E113511 Type 2 diabetes mellitus with proliferative diabetic retinopathy with macular edema, right eye: Secondary | ICD-10-CM | POA: Diagnosis not present

## 2016-06-09 DIAGNOSIS — E1142 Type 2 diabetes mellitus with diabetic polyneuropathy: Secondary | ICD-10-CM | POA: Diagnosis not present

## 2016-06-09 DIAGNOSIS — R2689 Other abnormalities of gait and mobility: Secondary | ICD-10-CM | POA: Diagnosis not present

## 2016-06-09 DIAGNOSIS — Z7401 Bed confinement status: Secondary | ICD-10-CM | POA: Diagnosis not present

## 2016-06-09 DIAGNOSIS — Z79899 Other long term (current) drug therapy: Secondary | ICD-10-CM | POA: Diagnosis not present

## 2016-06-09 DIAGNOSIS — D509 Iron deficiency anemia, unspecified: Secondary | ICD-10-CM | POA: Diagnosis not present

## 2016-06-09 DIAGNOSIS — Z89512 Acquired absence of left leg below knee: Secondary | ICD-10-CM | POA: Diagnosis not present

## 2016-06-09 DIAGNOSIS — E1140 Type 2 diabetes mellitus with diabetic neuropathy, unspecified: Secondary | ICD-10-CM | POA: Diagnosis not present

## 2016-06-09 DIAGNOSIS — I10 Essential (primary) hypertension: Secondary | ICD-10-CM | POA: Diagnosis not present

## 2016-06-09 DIAGNOSIS — E1165 Type 2 diabetes mellitus with hyperglycemia: Secondary | ICD-10-CM | POA: Diagnosis not present

## 2016-06-09 DIAGNOSIS — N3281 Overactive bladder: Secondary | ICD-10-CM | POA: Diagnosis not present

## 2016-06-09 DIAGNOSIS — Z9114 Patient's other noncompliance with medication regimen: Secondary | ICD-10-CM | POA: Diagnosis not present

## 2016-07-02 DIAGNOSIS — E113511 Type 2 diabetes mellitus with proliferative diabetic retinopathy with macular edema, right eye: Secondary | ICD-10-CM | POA: Diagnosis not present

## 2016-07-07 DIAGNOSIS — R0602 Shortness of breath: Secondary | ICD-10-CM | POA: Diagnosis not present

## 2016-07-07 DIAGNOSIS — I10 Essential (primary) hypertension: Secondary | ICD-10-CM | POA: Diagnosis not present

## 2016-07-07 DIAGNOSIS — N390 Urinary tract infection, site not specified: Secondary | ICD-10-CM | POA: Diagnosis not present

## 2016-07-07 DIAGNOSIS — D508 Other iron deficiency anemias: Secondary | ICD-10-CM | POA: Diagnosis not present

## 2016-07-09 DIAGNOSIS — F329 Major depressive disorder, single episode, unspecified: Secondary | ICD-10-CM | POA: Diagnosis not present

## 2016-07-09 DIAGNOSIS — F39 Unspecified mood [affective] disorder: Secondary | ICD-10-CM | POA: Diagnosis not present

## 2016-07-10 DIAGNOSIS — R0602 Shortness of breath: Secondary | ICD-10-CM | POA: Diagnosis not present

## 2016-07-10 DIAGNOSIS — N390 Urinary tract infection, site not specified: Secondary | ICD-10-CM | POA: Diagnosis not present

## 2016-07-10 DIAGNOSIS — N938 Other specified abnormal uterine and vaginal bleeding: Secondary | ICD-10-CM | POA: Diagnosis not present

## 2016-07-28 DIAGNOSIS — E860 Dehydration: Secondary | ICD-10-CM | POA: Diagnosis not present

## 2016-07-28 DIAGNOSIS — E1165 Type 2 diabetes mellitus with hyperglycemia: Secondary | ICD-10-CM | POA: Diagnosis not present

## 2016-07-28 DIAGNOSIS — E1142 Type 2 diabetes mellitus with diabetic polyneuropathy: Secondary | ICD-10-CM | POA: Diagnosis not present

## 2016-07-28 DIAGNOSIS — E1122 Type 2 diabetes mellitus with diabetic chronic kidney disease: Secondary | ICD-10-CM | POA: Diagnosis not present

## 2016-07-28 DIAGNOSIS — Z9114 Patient's other noncompliance with medication regimen: Secondary | ICD-10-CM | POA: Diagnosis not present

## 2016-07-28 DIAGNOSIS — N189 Chronic kidney disease, unspecified: Secondary | ICD-10-CM | POA: Diagnosis not present

## 2016-07-28 DIAGNOSIS — I129 Hypertensive chronic kidney disease with stage 1 through stage 4 chronic kidney disease, or unspecified chronic kidney disease: Secondary | ICD-10-CM | POA: Diagnosis not present

## 2016-07-28 DIAGNOSIS — R739 Hyperglycemia, unspecified: Secondary | ICD-10-CM | POA: Diagnosis not present

## 2016-07-30 DIAGNOSIS — E1165 Type 2 diabetes mellitus with hyperglycemia: Secondary | ICD-10-CM | POA: Diagnosis not present

## 2016-07-30 DIAGNOSIS — Z7401 Bed confinement status: Secondary | ICD-10-CM | POA: Diagnosis not present

## 2016-07-30 DIAGNOSIS — R279 Unspecified lack of coordination: Secondary | ICD-10-CM | POA: Diagnosis not present

## 2016-07-31 DIAGNOSIS — I10 Essential (primary) hypertension: Secondary | ICD-10-CM | POA: Diagnosis not present

## 2016-07-31 DIAGNOSIS — K219 Gastro-esophageal reflux disease without esophagitis: Secondary | ICD-10-CM | POA: Diagnosis not present

## 2016-07-31 DIAGNOSIS — E1140 Type 2 diabetes mellitus with diabetic neuropathy, unspecified: Secondary | ICD-10-CM | POA: Diagnosis not present

## 2016-07-31 DIAGNOSIS — F330 Major depressive disorder, recurrent, mild: Secondary | ICD-10-CM | POA: Diagnosis not present

## 2016-08-04 DIAGNOSIS — F330 Major depressive disorder, recurrent, mild: Secondary | ICD-10-CM | POA: Diagnosis not present

## 2016-08-04 DIAGNOSIS — K219 Gastro-esophageal reflux disease without esophagitis: Secondary | ICD-10-CM | POA: Diagnosis not present

## 2016-08-04 DIAGNOSIS — E1140 Type 2 diabetes mellitus with diabetic neuropathy, unspecified: Secondary | ICD-10-CM | POA: Diagnosis not present

## 2016-08-04 DIAGNOSIS — E875 Hyperkalemia: Secondary | ICD-10-CM | POA: Diagnosis not present

## 2016-08-04 DIAGNOSIS — I10 Essential (primary) hypertension: Secondary | ICD-10-CM | POA: Diagnosis not present

## 2016-08-26 DIAGNOSIS — R4189 Other symptoms and signs involving cognitive functions and awareness: Secondary | ICD-10-CM | POA: Diagnosis not present

## 2016-08-26 DIAGNOSIS — R41841 Cognitive communication deficit: Secondary | ICD-10-CM | POA: Diagnosis not present

## 2016-08-26 DIAGNOSIS — M6281 Muscle weakness (generalized): Secondary | ICD-10-CM | POA: Diagnosis not present

## 2016-08-26 DIAGNOSIS — R1312 Dysphagia, oropharyngeal phase: Secondary | ICD-10-CM | POA: Diagnosis not present

## 2016-08-26 DIAGNOSIS — I509 Heart failure, unspecified: Secondary | ICD-10-CM | POA: Diagnosis not present

## 2016-09-04 DIAGNOSIS — F330 Major depressive disorder, recurrent, mild: Secondary | ICD-10-CM | POA: Diagnosis not present

## 2016-09-04 DIAGNOSIS — E875 Hyperkalemia: Secondary | ICD-10-CM | POA: Diagnosis not present

## 2016-09-04 DIAGNOSIS — E1140 Type 2 diabetes mellitus with diabetic neuropathy, unspecified: Secondary | ICD-10-CM | POA: Diagnosis not present

## 2016-09-04 DIAGNOSIS — K219 Gastro-esophageal reflux disease without esophagitis: Secondary | ICD-10-CM | POA: Diagnosis not present

## 2016-09-18 DIAGNOSIS — E782 Mixed hyperlipidemia: Secondary | ICD-10-CM | POA: Diagnosis not present

## 2016-09-18 DIAGNOSIS — E559 Vitamin D deficiency, unspecified: Secondary | ICD-10-CM | POA: Diagnosis not present

## 2016-09-18 DIAGNOSIS — E119 Type 2 diabetes mellitus without complications: Secondary | ICD-10-CM | POA: Diagnosis not present

## 2016-09-18 DIAGNOSIS — Z79899 Other long term (current) drug therapy: Secondary | ICD-10-CM | POA: Diagnosis not present

## 2016-09-18 DIAGNOSIS — D518 Other vitamin B12 deficiency anemias: Secondary | ICD-10-CM | POA: Diagnosis not present

## 2016-09-23 DIAGNOSIS — E1140 Type 2 diabetes mellitus with diabetic neuropathy, unspecified: Secondary | ICD-10-CM | POA: Diagnosis not present

## 2016-09-23 DIAGNOSIS — F330 Major depressive disorder, recurrent, mild: Secondary | ICD-10-CM | POA: Diagnosis not present

## 2016-09-23 DIAGNOSIS — I10 Essential (primary) hypertension: Secondary | ICD-10-CM | POA: Diagnosis not present

## 2016-09-23 DIAGNOSIS — I2510 Atherosclerotic heart disease of native coronary artery without angina pectoris: Secondary | ICD-10-CM | POA: Diagnosis not present

## 2016-09-30 DIAGNOSIS — F330 Major depressive disorder, recurrent, mild: Secondary | ICD-10-CM | POA: Diagnosis not present

## 2016-09-30 DIAGNOSIS — E875 Hyperkalemia: Secondary | ICD-10-CM | POA: Diagnosis not present

## 2016-09-30 DIAGNOSIS — E1140 Type 2 diabetes mellitus with diabetic neuropathy, unspecified: Secondary | ICD-10-CM | POA: Diagnosis not present

## 2016-09-30 DIAGNOSIS — I10 Essential (primary) hypertension: Secondary | ICD-10-CM | POA: Diagnosis not present

## 2016-09-30 DIAGNOSIS — Z79899 Other long term (current) drug therapy: Secondary | ICD-10-CM | POA: Diagnosis not present

## 2016-10-22 DIAGNOSIS — F330 Major depressive disorder, recurrent, mild: Secondary | ICD-10-CM | POA: Diagnosis not present

## 2016-10-22 DIAGNOSIS — E875 Hyperkalemia: Secondary | ICD-10-CM | POA: Diagnosis not present

## 2016-10-22 DIAGNOSIS — E1140 Type 2 diabetes mellitus with diabetic neuropathy, unspecified: Secondary | ICD-10-CM | POA: Diagnosis not present

## 2016-10-22 DIAGNOSIS — I10 Essential (primary) hypertension: Secondary | ICD-10-CM | POA: Diagnosis not present

## 2016-11-06 DIAGNOSIS — Z79899 Other long term (current) drug therapy: Secondary | ICD-10-CM | POA: Diagnosis not present

## 2016-11-10 DIAGNOSIS — H35043 Retinal micro-aneurysms, unspecified, bilateral: Secondary | ICD-10-CM | POA: Diagnosis not present

## 2016-11-10 DIAGNOSIS — E113552 Type 2 diabetes mellitus with stable proliferative diabetic retinopathy, left eye: Secondary | ICD-10-CM | POA: Diagnosis not present

## 2016-11-10 DIAGNOSIS — H43811 Vitreous degeneration, right eye: Secondary | ICD-10-CM | POA: Diagnosis not present

## 2016-11-10 DIAGNOSIS — E113551 Type 2 diabetes mellitus with stable proliferative diabetic retinopathy, right eye: Secondary | ICD-10-CM | POA: Diagnosis not present

## 2016-11-10 DIAGNOSIS — H43822 Vitreomacular adhesion, left eye: Secondary | ICD-10-CM | POA: Diagnosis not present

## 2016-11-19 DIAGNOSIS — E875 Hyperkalemia: Secondary | ICD-10-CM | POA: Diagnosis not present

## 2016-11-19 DIAGNOSIS — E1140 Type 2 diabetes mellitus with diabetic neuropathy, unspecified: Secondary | ICD-10-CM | POA: Diagnosis not present

## 2016-11-19 DIAGNOSIS — F330 Major depressive disorder, recurrent, mild: Secondary | ICD-10-CM | POA: Diagnosis not present

## 2016-11-19 DIAGNOSIS — I10 Essential (primary) hypertension: Secondary | ICD-10-CM | POA: Diagnosis not present

## 2016-11-24 DIAGNOSIS — F330 Major depressive disorder, recurrent, mild: Secondary | ICD-10-CM | POA: Diagnosis not present

## 2016-12-08 DIAGNOSIS — R239 Unspecified skin changes: Secondary | ICD-10-CM | POA: Diagnosis not present

## 2016-12-08 DIAGNOSIS — S30810A Abrasion of lower back and pelvis, initial encounter: Secondary | ICD-10-CM | POA: Diagnosis not present

## 2016-12-15 DIAGNOSIS — E782 Mixed hyperlipidemia: Secondary | ICD-10-CM | POA: Diagnosis not present

## 2016-12-15 DIAGNOSIS — Z79899 Other long term (current) drug therapy: Secondary | ICD-10-CM | POA: Diagnosis not present

## 2016-12-15 DIAGNOSIS — E119 Type 2 diabetes mellitus without complications: Secondary | ICD-10-CM | POA: Diagnosis not present

## 2016-12-16 DIAGNOSIS — M6281 Muscle weakness (generalized): Secondary | ICD-10-CM | POA: Diagnosis not present

## 2016-12-16 DIAGNOSIS — B370 Candidal stomatitis: Secondary | ICD-10-CM | POA: Diagnosis not present

## 2016-12-16 DIAGNOSIS — I509 Heart failure, unspecified: Secondary | ICD-10-CM | POA: Diagnosis not present

## 2016-12-16 DIAGNOSIS — R4189 Other symptoms and signs involving cognitive functions and awareness: Secondary | ICD-10-CM | POA: Diagnosis not present

## 2016-12-16 DIAGNOSIS — R41841 Cognitive communication deficit: Secondary | ICD-10-CM | POA: Diagnosis not present

## 2016-12-16 DIAGNOSIS — R1312 Dysphagia, oropharyngeal phase: Secondary | ICD-10-CM | POA: Diagnosis not present

## 2016-12-17 DIAGNOSIS — R1312 Dysphagia, oropharyngeal phase: Secondary | ICD-10-CM | POA: Diagnosis not present

## 2016-12-17 DIAGNOSIS — I509 Heart failure, unspecified: Secondary | ICD-10-CM | POA: Diagnosis not present

## 2016-12-17 DIAGNOSIS — R4189 Other symptoms and signs involving cognitive functions and awareness: Secondary | ICD-10-CM | POA: Diagnosis not present

## 2016-12-17 DIAGNOSIS — M6281 Muscle weakness (generalized): Secondary | ICD-10-CM | POA: Diagnosis not present

## 2016-12-17 DIAGNOSIS — R41841 Cognitive communication deficit: Secondary | ICD-10-CM | POA: Diagnosis not present

## 2016-12-18 DIAGNOSIS — I509 Heart failure, unspecified: Secondary | ICD-10-CM | POA: Diagnosis not present

## 2016-12-18 DIAGNOSIS — R41841 Cognitive communication deficit: Secondary | ICD-10-CM | POA: Diagnosis not present

## 2016-12-18 DIAGNOSIS — R4189 Other symptoms and signs involving cognitive functions and awareness: Secondary | ICD-10-CM | POA: Diagnosis not present

## 2016-12-18 DIAGNOSIS — M6281 Muscle weakness (generalized): Secondary | ICD-10-CM | POA: Diagnosis not present

## 2016-12-18 DIAGNOSIS — R1312 Dysphagia, oropharyngeal phase: Secondary | ICD-10-CM | POA: Diagnosis not present

## 2016-12-19 DIAGNOSIS — R41841 Cognitive communication deficit: Secondary | ICD-10-CM | POA: Diagnosis not present

## 2016-12-19 DIAGNOSIS — R4189 Other symptoms and signs involving cognitive functions and awareness: Secondary | ICD-10-CM | POA: Diagnosis not present

## 2016-12-19 DIAGNOSIS — I509 Heart failure, unspecified: Secondary | ICD-10-CM | POA: Diagnosis not present

## 2016-12-19 DIAGNOSIS — M6281 Muscle weakness (generalized): Secondary | ICD-10-CM | POA: Diagnosis not present

## 2016-12-19 DIAGNOSIS — R1312 Dysphagia, oropharyngeal phase: Secondary | ICD-10-CM | POA: Diagnosis not present

## 2016-12-22 DIAGNOSIS — R1312 Dysphagia, oropharyngeal phase: Secondary | ICD-10-CM | POA: Diagnosis not present

## 2016-12-22 DIAGNOSIS — I509 Heart failure, unspecified: Secondary | ICD-10-CM | POA: Diagnosis not present

## 2016-12-22 DIAGNOSIS — R4189 Other symptoms and signs involving cognitive functions and awareness: Secondary | ICD-10-CM | POA: Diagnosis not present

## 2016-12-22 DIAGNOSIS — R41841 Cognitive communication deficit: Secondary | ICD-10-CM | POA: Diagnosis not present

## 2016-12-22 DIAGNOSIS — M6281 Muscle weakness (generalized): Secondary | ICD-10-CM | POA: Diagnosis not present

## 2016-12-23 DIAGNOSIS — R4189 Other symptoms and signs involving cognitive functions and awareness: Secondary | ICD-10-CM | POA: Diagnosis not present

## 2016-12-23 DIAGNOSIS — M6281 Muscle weakness (generalized): Secondary | ICD-10-CM | POA: Diagnosis not present

## 2016-12-23 DIAGNOSIS — E1140 Type 2 diabetes mellitus with diabetic neuropathy, unspecified: Secondary | ICD-10-CM | POA: Diagnosis not present

## 2016-12-23 DIAGNOSIS — R41841 Cognitive communication deficit: Secondary | ICD-10-CM | POA: Diagnosis not present

## 2016-12-23 DIAGNOSIS — R1312 Dysphagia, oropharyngeal phase: Secondary | ICD-10-CM | POA: Diagnosis not present

## 2016-12-23 DIAGNOSIS — N183 Chronic kidney disease, stage 3 (moderate): Secondary | ICD-10-CM | POA: Diagnosis not present

## 2016-12-23 DIAGNOSIS — I509 Heart failure, unspecified: Secondary | ICD-10-CM | POA: Diagnosis not present

## 2016-12-23 DIAGNOSIS — I10 Essential (primary) hypertension: Secondary | ICD-10-CM | POA: Diagnosis not present

## 2016-12-23 DIAGNOSIS — K219 Gastro-esophageal reflux disease without esophagitis: Secondary | ICD-10-CM | POA: Diagnosis not present

## 2016-12-24 DIAGNOSIS — I509 Heart failure, unspecified: Secondary | ICD-10-CM | POA: Diagnosis not present

## 2016-12-24 DIAGNOSIS — M6281 Muscle weakness (generalized): Secondary | ICD-10-CM | POA: Diagnosis not present

## 2016-12-24 DIAGNOSIS — R1312 Dysphagia, oropharyngeal phase: Secondary | ICD-10-CM | POA: Diagnosis not present

## 2016-12-24 DIAGNOSIS — R41841 Cognitive communication deficit: Secondary | ICD-10-CM | POA: Diagnosis not present

## 2016-12-24 DIAGNOSIS — R4189 Other symptoms and signs involving cognitive functions and awareness: Secondary | ICD-10-CM | POA: Diagnosis not present

## 2016-12-25 DIAGNOSIS — M6281 Muscle weakness (generalized): Secondary | ICD-10-CM | POA: Diagnosis not present

## 2016-12-25 DIAGNOSIS — R1312 Dysphagia, oropharyngeal phase: Secondary | ICD-10-CM | POA: Diagnosis not present

## 2016-12-25 DIAGNOSIS — I509 Heart failure, unspecified: Secondary | ICD-10-CM | POA: Diagnosis not present

## 2016-12-25 DIAGNOSIS — R41841 Cognitive communication deficit: Secondary | ICD-10-CM | POA: Diagnosis not present

## 2016-12-25 DIAGNOSIS — R4189 Other symptoms and signs involving cognitive functions and awareness: Secondary | ICD-10-CM | POA: Diagnosis not present

## 2016-12-26 DIAGNOSIS — M6281 Muscle weakness (generalized): Secondary | ICD-10-CM | POA: Diagnosis not present

## 2016-12-26 DIAGNOSIS — I509 Heart failure, unspecified: Secondary | ICD-10-CM | POA: Diagnosis not present

## 2016-12-26 DIAGNOSIS — R4189 Other symptoms and signs involving cognitive functions and awareness: Secondary | ICD-10-CM | POA: Diagnosis not present

## 2016-12-26 DIAGNOSIS — R1312 Dysphagia, oropharyngeal phase: Secondary | ICD-10-CM | POA: Diagnosis not present

## 2016-12-26 DIAGNOSIS — R41841 Cognitive communication deficit: Secondary | ICD-10-CM | POA: Diagnosis not present

## 2016-12-29 DIAGNOSIS — I509 Heart failure, unspecified: Secondary | ICD-10-CM | POA: Diagnosis not present

## 2016-12-29 DIAGNOSIS — M6281 Muscle weakness (generalized): Secondary | ICD-10-CM | POA: Diagnosis not present

## 2016-12-29 DIAGNOSIS — R4189 Other symptoms and signs involving cognitive functions and awareness: Secondary | ICD-10-CM | POA: Diagnosis not present

## 2016-12-29 DIAGNOSIS — R41841 Cognitive communication deficit: Secondary | ICD-10-CM | POA: Diagnosis not present

## 2016-12-29 DIAGNOSIS — R1312 Dysphagia, oropharyngeal phase: Secondary | ICD-10-CM | POA: Diagnosis not present

## 2016-12-30 DIAGNOSIS — R4189 Other symptoms and signs involving cognitive functions and awareness: Secondary | ICD-10-CM | POA: Diagnosis not present

## 2016-12-30 DIAGNOSIS — M6281 Muscle weakness (generalized): Secondary | ICD-10-CM | POA: Diagnosis not present

## 2016-12-30 DIAGNOSIS — R41841 Cognitive communication deficit: Secondary | ICD-10-CM | POA: Diagnosis not present

## 2016-12-30 DIAGNOSIS — I509 Heart failure, unspecified: Secondary | ICD-10-CM | POA: Diagnosis not present

## 2016-12-30 DIAGNOSIS — R1312 Dysphagia, oropharyngeal phase: Secondary | ICD-10-CM | POA: Diagnosis not present

## 2016-12-31 DIAGNOSIS — M6281 Muscle weakness (generalized): Secondary | ICD-10-CM | POA: Diagnosis not present

## 2016-12-31 DIAGNOSIS — R41841 Cognitive communication deficit: Secondary | ICD-10-CM | POA: Diagnosis not present

## 2016-12-31 DIAGNOSIS — R4189 Other symptoms and signs involving cognitive functions and awareness: Secondary | ICD-10-CM | POA: Diagnosis not present

## 2016-12-31 DIAGNOSIS — I509 Heart failure, unspecified: Secondary | ICD-10-CM | POA: Diagnosis not present

## 2016-12-31 DIAGNOSIS — R1312 Dysphagia, oropharyngeal phase: Secondary | ICD-10-CM | POA: Diagnosis not present

## 2017-01-02 DIAGNOSIS — R41841 Cognitive communication deficit: Secondary | ICD-10-CM | POA: Diagnosis not present

## 2017-01-02 DIAGNOSIS — R4189 Other symptoms and signs involving cognitive functions and awareness: Secondary | ICD-10-CM | POA: Diagnosis not present

## 2017-01-02 DIAGNOSIS — I509 Heart failure, unspecified: Secondary | ICD-10-CM | POA: Diagnosis not present

## 2017-01-02 DIAGNOSIS — M6281 Muscle weakness (generalized): Secondary | ICD-10-CM | POA: Diagnosis not present

## 2017-01-02 DIAGNOSIS — R1312 Dysphagia, oropharyngeal phase: Secondary | ICD-10-CM | POA: Diagnosis not present

## 2017-01-03 DIAGNOSIS — R41841 Cognitive communication deficit: Secondary | ICD-10-CM | POA: Diagnosis not present

## 2017-01-03 DIAGNOSIS — M6281 Muscle weakness (generalized): Secondary | ICD-10-CM | POA: Diagnosis not present

## 2017-01-03 DIAGNOSIS — R4189 Other symptoms and signs involving cognitive functions and awareness: Secondary | ICD-10-CM | POA: Diagnosis not present

## 2017-01-03 DIAGNOSIS — I509 Heart failure, unspecified: Secondary | ICD-10-CM | POA: Diagnosis not present

## 2017-01-03 DIAGNOSIS — R1312 Dysphagia, oropharyngeal phase: Secondary | ICD-10-CM | POA: Diagnosis not present

## 2017-01-05 DIAGNOSIS — R4189 Other symptoms and signs involving cognitive functions and awareness: Secondary | ICD-10-CM | POA: Diagnosis not present

## 2017-01-05 DIAGNOSIS — M6281 Muscle weakness (generalized): Secondary | ICD-10-CM | POA: Diagnosis not present

## 2017-01-05 DIAGNOSIS — R41841 Cognitive communication deficit: Secondary | ICD-10-CM | POA: Diagnosis not present

## 2017-01-05 DIAGNOSIS — I509 Heart failure, unspecified: Secondary | ICD-10-CM | POA: Diagnosis not present

## 2017-01-05 DIAGNOSIS — R1312 Dysphagia, oropharyngeal phase: Secondary | ICD-10-CM | POA: Diagnosis not present

## 2017-01-06 DIAGNOSIS — M6281 Muscle weakness (generalized): Secondary | ICD-10-CM | POA: Diagnosis not present

## 2017-01-06 DIAGNOSIS — Z23 Encounter for immunization: Secondary | ICD-10-CM | POA: Diagnosis not present

## 2017-01-06 DIAGNOSIS — I509 Heart failure, unspecified: Secondary | ICD-10-CM | POA: Diagnosis not present

## 2017-01-06 DIAGNOSIS — R4189 Other symptoms and signs involving cognitive functions and awareness: Secondary | ICD-10-CM | POA: Diagnosis not present

## 2017-01-06 DIAGNOSIS — R41841 Cognitive communication deficit: Secondary | ICD-10-CM | POA: Diagnosis not present

## 2017-01-06 DIAGNOSIS — R1312 Dysphagia, oropharyngeal phase: Secondary | ICD-10-CM | POA: Diagnosis not present

## 2017-01-07 DIAGNOSIS — I509 Heart failure, unspecified: Secondary | ICD-10-CM | POA: Diagnosis not present

## 2017-01-07 DIAGNOSIS — R41841 Cognitive communication deficit: Secondary | ICD-10-CM | POA: Diagnosis not present

## 2017-01-07 DIAGNOSIS — R4189 Other symptoms and signs involving cognitive functions and awareness: Secondary | ICD-10-CM | POA: Diagnosis not present

## 2017-01-07 DIAGNOSIS — R1312 Dysphagia, oropharyngeal phase: Secondary | ICD-10-CM | POA: Diagnosis not present

## 2017-01-07 DIAGNOSIS — M6281 Muscle weakness (generalized): Secondary | ICD-10-CM | POA: Diagnosis not present

## 2017-01-08 DIAGNOSIS — M6281 Muscle weakness (generalized): Secondary | ICD-10-CM | POA: Diagnosis not present

## 2017-01-08 DIAGNOSIS — R4189 Other symptoms and signs involving cognitive functions and awareness: Secondary | ICD-10-CM | POA: Diagnosis not present

## 2017-01-08 DIAGNOSIS — R1312 Dysphagia, oropharyngeal phase: Secondary | ICD-10-CM | POA: Diagnosis not present

## 2017-01-08 DIAGNOSIS — I509 Heart failure, unspecified: Secondary | ICD-10-CM | POA: Diagnosis not present

## 2017-01-08 DIAGNOSIS — R41841 Cognitive communication deficit: Secondary | ICD-10-CM | POA: Diagnosis not present

## 2017-01-09 DIAGNOSIS — R41841 Cognitive communication deficit: Secondary | ICD-10-CM | POA: Diagnosis not present

## 2017-01-09 DIAGNOSIS — R4189 Other symptoms and signs involving cognitive functions and awareness: Secondary | ICD-10-CM | POA: Diagnosis not present

## 2017-01-09 DIAGNOSIS — M6281 Muscle weakness (generalized): Secondary | ICD-10-CM | POA: Diagnosis not present

## 2017-01-09 DIAGNOSIS — R1312 Dysphagia, oropharyngeal phase: Secondary | ICD-10-CM | POA: Diagnosis not present

## 2017-01-09 DIAGNOSIS — I509 Heart failure, unspecified: Secondary | ICD-10-CM | POA: Diagnosis not present

## 2017-01-15 DIAGNOSIS — I509 Heart failure, unspecified: Secondary | ICD-10-CM | POA: Diagnosis not present

## 2017-01-15 DIAGNOSIS — M6281 Muscle weakness (generalized): Secondary | ICD-10-CM | POA: Diagnosis not present

## 2017-01-15 DIAGNOSIS — R41841 Cognitive communication deficit: Secondary | ICD-10-CM | POA: Diagnosis not present

## 2017-01-15 DIAGNOSIS — R4189 Other symptoms and signs involving cognitive functions and awareness: Secondary | ICD-10-CM | POA: Diagnosis not present

## 2017-01-15 DIAGNOSIS — R1312 Dysphagia, oropharyngeal phase: Secondary | ICD-10-CM | POA: Diagnosis not present

## 2017-01-16 DIAGNOSIS — M6281 Muscle weakness (generalized): Secondary | ICD-10-CM | POA: Diagnosis not present

## 2017-01-16 DIAGNOSIS — I509 Heart failure, unspecified: Secondary | ICD-10-CM | POA: Diagnosis not present

## 2017-01-16 DIAGNOSIS — R4189 Other symptoms and signs involving cognitive functions and awareness: Secondary | ICD-10-CM | POA: Diagnosis not present

## 2017-01-16 DIAGNOSIS — R41841 Cognitive communication deficit: Secondary | ICD-10-CM | POA: Diagnosis not present

## 2017-01-16 DIAGNOSIS — R1312 Dysphagia, oropharyngeal phase: Secondary | ICD-10-CM | POA: Diagnosis not present

## 2017-01-19 DIAGNOSIS — R41841 Cognitive communication deficit: Secondary | ICD-10-CM | POA: Diagnosis not present

## 2017-01-19 DIAGNOSIS — F329 Major depressive disorder, single episode, unspecified: Secondary | ICD-10-CM | POA: Diagnosis not present

## 2017-01-19 DIAGNOSIS — R1312 Dysphagia, oropharyngeal phase: Secondary | ICD-10-CM | POA: Diagnosis not present

## 2017-01-19 DIAGNOSIS — I509 Heart failure, unspecified: Secondary | ICD-10-CM | POA: Diagnosis not present

## 2017-01-19 DIAGNOSIS — M6281 Muscle weakness (generalized): Secondary | ICD-10-CM | POA: Diagnosis not present

## 2017-01-19 DIAGNOSIS — R4189 Other symptoms and signs involving cognitive functions and awareness: Secondary | ICD-10-CM | POA: Diagnosis not present

## 2017-01-20 DIAGNOSIS — R41841 Cognitive communication deficit: Secondary | ICD-10-CM | POA: Diagnosis not present

## 2017-01-20 DIAGNOSIS — I10 Essential (primary) hypertension: Secondary | ICD-10-CM | POA: Diagnosis not present

## 2017-01-20 DIAGNOSIS — K219 Gastro-esophageal reflux disease without esophagitis: Secondary | ICD-10-CM | POA: Diagnosis not present

## 2017-01-20 DIAGNOSIS — R1312 Dysphagia, oropharyngeal phase: Secondary | ICD-10-CM | POA: Diagnosis not present

## 2017-01-20 DIAGNOSIS — R4189 Other symptoms and signs involving cognitive functions and awareness: Secondary | ICD-10-CM | POA: Diagnosis not present

## 2017-01-20 DIAGNOSIS — M6281 Muscle weakness (generalized): Secondary | ICD-10-CM | POA: Diagnosis not present

## 2017-01-20 DIAGNOSIS — I2510 Atherosclerotic heart disease of native coronary artery without angina pectoris: Secondary | ICD-10-CM | POA: Diagnosis not present

## 2017-01-20 DIAGNOSIS — E1140 Type 2 diabetes mellitus with diabetic neuropathy, unspecified: Secondary | ICD-10-CM | POA: Diagnosis not present

## 2017-01-20 DIAGNOSIS — I509 Heart failure, unspecified: Secondary | ICD-10-CM | POA: Diagnosis not present

## 2017-01-21 DIAGNOSIS — I509 Heart failure, unspecified: Secondary | ICD-10-CM | POA: Diagnosis not present

## 2017-01-21 DIAGNOSIS — M6281 Muscle weakness (generalized): Secondary | ICD-10-CM | POA: Diagnosis not present

## 2017-01-21 DIAGNOSIS — R41841 Cognitive communication deficit: Secondary | ICD-10-CM | POA: Diagnosis not present

## 2017-01-21 DIAGNOSIS — R4189 Other symptoms and signs involving cognitive functions and awareness: Secondary | ICD-10-CM | POA: Diagnosis not present

## 2017-01-21 DIAGNOSIS — R1312 Dysphagia, oropharyngeal phase: Secondary | ICD-10-CM | POA: Diagnosis not present

## 2017-01-22 DIAGNOSIS — R4189 Other symptoms and signs involving cognitive functions and awareness: Secondary | ICD-10-CM | POA: Diagnosis not present

## 2017-01-22 DIAGNOSIS — I509 Heart failure, unspecified: Secondary | ICD-10-CM | POA: Diagnosis not present

## 2017-01-22 DIAGNOSIS — M6281 Muscle weakness (generalized): Secondary | ICD-10-CM | POA: Diagnosis not present

## 2017-01-22 DIAGNOSIS — R1312 Dysphagia, oropharyngeal phase: Secondary | ICD-10-CM | POA: Diagnosis not present

## 2017-01-22 DIAGNOSIS — R41841 Cognitive communication deficit: Secondary | ICD-10-CM | POA: Diagnosis not present

## 2017-01-23 DIAGNOSIS — R1312 Dysphagia, oropharyngeal phase: Secondary | ICD-10-CM | POA: Diagnosis not present

## 2017-01-23 DIAGNOSIS — R4189 Other symptoms and signs involving cognitive functions and awareness: Secondary | ICD-10-CM | POA: Diagnosis not present

## 2017-01-23 DIAGNOSIS — I509 Heart failure, unspecified: Secondary | ICD-10-CM | POA: Diagnosis not present

## 2017-01-23 DIAGNOSIS — R41841 Cognitive communication deficit: Secondary | ICD-10-CM | POA: Diagnosis not present

## 2017-01-23 DIAGNOSIS — M6281 Muscle weakness (generalized): Secondary | ICD-10-CM | POA: Diagnosis not present

## 2017-01-26 DIAGNOSIS — I509 Heart failure, unspecified: Secondary | ICD-10-CM | POA: Diagnosis not present

## 2017-01-26 DIAGNOSIS — R4189 Other symptoms and signs involving cognitive functions and awareness: Secondary | ICD-10-CM | POA: Diagnosis not present

## 2017-01-26 DIAGNOSIS — M6281 Muscle weakness (generalized): Secondary | ICD-10-CM | POA: Diagnosis not present

## 2017-01-26 DIAGNOSIS — R1312 Dysphagia, oropharyngeal phase: Secondary | ICD-10-CM | POA: Diagnosis not present

## 2017-01-26 DIAGNOSIS — R41841 Cognitive communication deficit: Secondary | ICD-10-CM | POA: Diagnosis not present

## 2017-01-27 DIAGNOSIS — R41841 Cognitive communication deficit: Secondary | ICD-10-CM | POA: Diagnosis not present

## 2017-01-27 DIAGNOSIS — R1312 Dysphagia, oropharyngeal phase: Secondary | ICD-10-CM | POA: Diagnosis not present

## 2017-01-27 DIAGNOSIS — I509 Heart failure, unspecified: Secondary | ICD-10-CM | POA: Diagnosis not present

## 2017-01-27 DIAGNOSIS — R4189 Other symptoms and signs involving cognitive functions and awareness: Secondary | ICD-10-CM | POA: Diagnosis not present

## 2017-01-27 DIAGNOSIS — M6281 Muscle weakness (generalized): Secondary | ICD-10-CM | POA: Diagnosis not present

## 2017-01-28 DIAGNOSIS — R41841 Cognitive communication deficit: Secondary | ICD-10-CM | POA: Diagnosis not present

## 2017-01-28 DIAGNOSIS — R1312 Dysphagia, oropharyngeal phase: Secondary | ICD-10-CM | POA: Diagnosis not present

## 2017-01-28 DIAGNOSIS — I509 Heart failure, unspecified: Secondary | ICD-10-CM | POA: Diagnosis not present

## 2017-01-28 DIAGNOSIS — R4189 Other symptoms and signs involving cognitive functions and awareness: Secondary | ICD-10-CM | POA: Diagnosis not present

## 2017-01-28 DIAGNOSIS — M6281 Muscle weakness (generalized): Secondary | ICD-10-CM | POA: Diagnosis not present

## 2017-01-29 DIAGNOSIS — R4189 Other symptoms and signs involving cognitive functions and awareness: Secondary | ICD-10-CM | POA: Diagnosis not present

## 2017-01-29 DIAGNOSIS — R41841 Cognitive communication deficit: Secondary | ICD-10-CM | POA: Diagnosis not present

## 2017-01-29 DIAGNOSIS — I509 Heart failure, unspecified: Secondary | ICD-10-CM | POA: Diagnosis not present

## 2017-01-29 DIAGNOSIS — R1312 Dysphagia, oropharyngeal phase: Secondary | ICD-10-CM | POA: Diagnosis not present

## 2017-01-29 DIAGNOSIS — M6281 Muscle weakness (generalized): Secondary | ICD-10-CM | POA: Diagnosis not present

## 2017-01-30 DIAGNOSIS — R1312 Dysphagia, oropharyngeal phase: Secondary | ICD-10-CM | POA: Diagnosis not present

## 2017-01-30 DIAGNOSIS — M6281 Muscle weakness (generalized): Secondary | ICD-10-CM | POA: Diagnosis not present

## 2017-01-30 DIAGNOSIS — R4189 Other symptoms and signs involving cognitive functions and awareness: Secondary | ICD-10-CM | POA: Diagnosis not present

## 2017-01-30 DIAGNOSIS — I509 Heart failure, unspecified: Secondary | ICD-10-CM | POA: Diagnosis not present

## 2017-01-30 DIAGNOSIS — R41841 Cognitive communication deficit: Secondary | ICD-10-CM | POA: Diagnosis not present

## 2017-02-02 DIAGNOSIS — I509 Heart failure, unspecified: Secondary | ICD-10-CM | POA: Diagnosis not present

## 2017-02-02 DIAGNOSIS — R4189 Other symptoms and signs involving cognitive functions and awareness: Secondary | ICD-10-CM | POA: Diagnosis not present

## 2017-02-02 DIAGNOSIS — M6281 Muscle weakness (generalized): Secondary | ICD-10-CM | POA: Diagnosis not present

## 2017-02-02 DIAGNOSIS — R1312 Dysphagia, oropharyngeal phase: Secondary | ICD-10-CM | POA: Diagnosis not present

## 2017-02-02 DIAGNOSIS — R41841 Cognitive communication deficit: Secondary | ICD-10-CM | POA: Diagnosis not present

## 2017-02-03 DIAGNOSIS — R4189 Other symptoms and signs involving cognitive functions and awareness: Secondary | ICD-10-CM | POA: Diagnosis not present

## 2017-02-03 DIAGNOSIS — I509 Heart failure, unspecified: Secondary | ICD-10-CM | POA: Diagnosis not present

## 2017-02-03 DIAGNOSIS — R1312 Dysphagia, oropharyngeal phase: Secondary | ICD-10-CM | POA: Diagnosis not present

## 2017-02-03 DIAGNOSIS — R41841 Cognitive communication deficit: Secondary | ICD-10-CM | POA: Diagnosis not present

## 2017-02-03 DIAGNOSIS — M6281 Muscle weakness (generalized): Secondary | ICD-10-CM | POA: Diagnosis not present

## 2017-02-04 DIAGNOSIS — R1312 Dysphagia, oropharyngeal phase: Secondary | ICD-10-CM | POA: Diagnosis not present

## 2017-02-04 DIAGNOSIS — R41841 Cognitive communication deficit: Secondary | ICD-10-CM | POA: Diagnosis not present

## 2017-02-04 DIAGNOSIS — I509 Heart failure, unspecified: Secondary | ICD-10-CM | POA: Diagnosis not present

## 2017-02-04 DIAGNOSIS — R4189 Other symptoms and signs involving cognitive functions and awareness: Secondary | ICD-10-CM | POA: Diagnosis not present

## 2017-02-04 DIAGNOSIS — M6281 Muscle weakness (generalized): Secondary | ICD-10-CM | POA: Diagnosis not present

## 2017-02-05 DIAGNOSIS — R4189 Other symptoms and signs involving cognitive functions and awareness: Secondary | ICD-10-CM | POA: Diagnosis not present

## 2017-02-05 DIAGNOSIS — M6281 Muscle weakness (generalized): Secondary | ICD-10-CM | POA: Diagnosis not present

## 2017-02-05 DIAGNOSIS — R1312 Dysphagia, oropharyngeal phase: Secondary | ICD-10-CM | POA: Diagnosis not present

## 2017-02-05 DIAGNOSIS — R41841 Cognitive communication deficit: Secondary | ICD-10-CM | POA: Diagnosis not present

## 2017-02-05 DIAGNOSIS — I509 Heart failure, unspecified: Secondary | ICD-10-CM | POA: Diagnosis not present

## 2017-02-06 DIAGNOSIS — R4189 Other symptoms and signs involving cognitive functions and awareness: Secondary | ICD-10-CM | POA: Diagnosis not present

## 2017-02-06 DIAGNOSIS — R41841 Cognitive communication deficit: Secondary | ICD-10-CM | POA: Diagnosis not present

## 2017-02-06 DIAGNOSIS — I509 Heart failure, unspecified: Secondary | ICD-10-CM | POA: Diagnosis not present

## 2017-02-06 DIAGNOSIS — M6281 Muscle weakness (generalized): Secondary | ICD-10-CM | POA: Diagnosis not present

## 2017-02-06 DIAGNOSIS — R1312 Dysphagia, oropharyngeal phase: Secondary | ICD-10-CM | POA: Diagnosis not present

## 2017-02-09 DIAGNOSIS — M6281 Muscle weakness (generalized): Secondary | ICD-10-CM | POA: Diagnosis not present

## 2017-02-09 DIAGNOSIS — I509 Heart failure, unspecified: Secondary | ICD-10-CM | POA: Diagnosis not present

## 2017-02-09 DIAGNOSIS — R1312 Dysphagia, oropharyngeal phase: Secondary | ICD-10-CM | POA: Diagnosis not present

## 2017-02-09 DIAGNOSIS — R4189 Other symptoms and signs involving cognitive functions and awareness: Secondary | ICD-10-CM | POA: Diagnosis not present

## 2017-02-09 DIAGNOSIS — R41841 Cognitive communication deficit: Secondary | ICD-10-CM | POA: Diagnosis not present

## 2017-02-10 DIAGNOSIS — R1312 Dysphagia, oropharyngeal phase: Secondary | ICD-10-CM | POA: Diagnosis not present

## 2017-02-10 DIAGNOSIS — R4189 Other symptoms and signs involving cognitive functions and awareness: Secondary | ICD-10-CM | POA: Diagnosis not present

## 2017-02-10 DIAGNOSIS — I509 Heart failure, unspecified: Secondary | ICD-10-CM | POA: Diagnosis not present

## 2017-02-10 DIAGNOSIS — R41841 Cognitive communication deficit: Secondary | ICD-10-CM | POA: Diagnosis not present

## 2017-02-10 DIAGNOSIS — M6281 Muscle weakness (generalized): Secondary | ICD-10-CM | POA: Diagnosis not present

## 2017-02-11 DIAGNOSIS — R41841 Cognitive communication deficit: Secondary | ICD-10-CM | POA: Diagnosis not present

## 2017-02-11 DIAGNOSIS — R1312 Dysphagia, oropharyngeal phase: Secondary | ICD-10-CM | POA: Diagnosis not present

## 2017-02-11 DIAGNOSIS — M6281 Muscle weakness (generalized): Secondary | ICD-10-CM | POA: Diagnosis not present

## 2017-02-11 DIAGNOSIS — R4189 Other symptoms and signs involving cognitive functions and awareness: Secondary | ICD-10-CM | POA: Diagnosis not present

## 2017-02-11 DIAGNOSIS — I509 Heart failure, unspecified: Secondary | ICD-10-CM | POA: Diagnosis not present

## 2017-02-12 DIAGNOSIS — R4189 Other symptoms and signs involving cognitive functions and awareness: Secondary | ICD-10-CM | POA: Diagnosis not present

## 2017-02-12 DIAGNOSIS — M6281 Muscle weakness (generalized): Secondary | ICD-10-CM | POA: Diagnosis not present

## 2017-02-12 DIAGNOSIS — R41841 Cognitive communication deficit: Secondary | ICD-10-CM | POA: Diagnosis not present

## 2017-02-12 DIAGNOSIS — I509 Heart failure, unspecified: Secondary | ICD-10-CM | POA: Diagnosis not present

## 2017-02-12 DIAGNOSIS — R1312 Dysphagia, oropharyngeal phase: Secondary | ICD-10-CM | POA: Diagnosis not present

## 2017-02-13 DIAGNOSIS — E1140 Type 2 diabetes mellitus with diabetic neuropathy, unspecified: Secondary | ICD-10-CM | POA: Diagnosis not present

## 2017-02-13 DIAGNOSIS — M6281 Muscle weakness (generalized): Secondary | ICD-10-CM | POA: Diagnosis not present

## 2017-02-13 DIAGNOSIS — I509 Heart failure, unspecified: Secondary | ICD-10-CM | POA: Diagnosis not present

## 2017-02-13 DIAGNOSIS — I10 Essential (primary) hypertension: Secondary | ICD-10-CM | POA: Diagnosis not present

## 2017-02-13 DIAGNOSIS — R41841 Cognitive communication deficit: Secondary | ICD-10-CM | POA: Diagnosis not present

## 2017-02-13 DIAGNOSIS — R1312 Dysphagia, oropharyngeal phase: Secondary | ICD-10-CM | POA: Diagnosis not present

## 2017-02-13 DIAGNOSIS — K219 Gastro-esophageal reflux disease without esophagitis: Secondary | ICD-10-CM | POA: Diagnosis not present

## 2017-02-13 DIAGNOSIS — R4189 Other symptoms and signs involving cognitive functions and awareness: Secondary | ICD-10-CM | POA: Diagnosis not present

## 2017-02-16 DIAGNOSIS — R41841 Cognitive communication deficit: Secondary | ICD-10-CM | POA: Diagnosis not present

## 2017-02-16 DIAGNOSIS — R4189 Other symptoms and signs involving cognitive functions and awareness: Secondary | ICD-10-CM | POA: Diagnosis not present

## 2017-02-16 DIAGNOSIS — M6281 Muscle weakness (generalized): Secondary | ICD-10-CM | POA: Diagnosis not present

## 2017-02-16 DIAGNOSIS — I509 Heart failure, unspecified: Secondary | ICD-10-CM | POA: Diagnosis not present

## 2017-02-16 DIAGNOSIS — R1312 Dysphagia, oropharyngeal phase: Secondary | ICD-10-CM | POA: Diagnosis not present

## 2017-02-17 DIAGNOSIS — R41841 Cognitive communication deficit: Secondary | ICD-10-CM | POA: Diagnosis not present

## 2017-02-17 DIAGNOSIS — M6281 Muscle weakness (generalized): Secondary | ICD-10-CM | POA: Diagnosis not present

## 2017-02-17 DIAGNOSIS — R4189 Other symptoms and signs involving cognitive functions and awareness: Secondary | ICD-10-CM | POA: Diagnosis not present

## 2017-02-17 DIAGNOSIS — I509 Heart failure, unspecified: Secondary | ICD-10-CM | POA: Diagnosis not present

## 2017-02-17 DIAGNOSIS — R1312 Dysphagia, oropharyngeal phase: Secondary | ICD-10-CM | POA: Diagnosis not present

## 2017-02-18 DIAGNOSIS — R1312 Dysphagia, oropharyngeal phase: Secondary | ICD-10-CM | POA: Diagnosis not present

## 2017-02-18 DIAGNOSIS — R41841 Cognitive communication deficit: Secondary | ICD-10-CM | POA: Diagnosis not present

## 2017-02-18 DIAGNOSIS — R4189 Other symptoms and signs involving cognitive functions and awareness: Secondary | ICD-10-CM | POA: Diagnosis not present

## 2017-02-18 DIAGNOSIS — M6281 Muscle weakness (generalized): Secondary | ICD-10-CM | POA: Diagnosis not present

## 2017-02-18 DIAGNOSIS — I509 Heart failure, unspecified: Secondary | ICD-10-CM | POA: Diagnosis not present

## 2017-02-19 DIAGNOSIS — R41841 Cognitive communication deficit: Secondary | ICD-10-CM | POA: Diagnosis not present

## 2017-02-19 DIAGNOSIS — R4189 Other symptoms and signs involving cognitive functions and awareness: Secondary | ICD-10-CM | POA: Diagnosis not present

## 2017-02-19 DIAGNOSIS — M6281 Muscle weakness (generalized): Secondary | ICD-10-CM | POA: Diagnosis not present

## 2017-02-19 DIAGNOSIS — I509 Heart failure, unspecified: Secondary | ICD-10-CM | POA: Diagnosis not present

## 2017-02-19 DIAGNOSIS — R1312 Dysphagia, oropharyngeal phase: Secondary | ICD-10-CM | POA: Diagnosis not present

## 2017-02-20 DIAGNOSIS — R41841 Cognitive communication deficit: Secondary | ICD-10-CM | POA: Diagnosis not present

## 2017-02-20 DIAGNOSIS — I509 Heart failure, unspecified: Secondary | ICD-10-CM | POA: Diagnosis not present

## 2017-02-20 DIAGNOSIS — R4189 Other symptoms and signs involving cognitive functions and awareness: Secondary | ICD-10-CM | POA: Diagnosis not present

## 2017-02-20 DIAGNOSIS — M6281 Muscle weakness (generalized): Secondary | ICD-10-CM | POA: Diagnosis not present

## 2017-02-20 DIAGNOSIS — R1312 Dysphagia, oropharyngeal phase: Secondary | ICD-10-CM | POA: Diagnosis not present

## 2017-02-23 DIAGNOSIS — R41841 Cognitive communication deficit: Secondary | ICD-10-CM | POA: Diagnosis not present

## 2017-02-23 DIAGNOSIS — R1312 Dysphagia, oropharyngeal phase: Secondary | ICD-10-CM | POA: Diagnosis not present

## 2017-02-23 DIAGNOSIS — R4189 Other symptoms and signs involving cognitive functions and awareness: Secondary | ICD-10-CM | POA: Diagnosis not present

## 2017-02-23 DIAGNOSIS — M6281 Muscle weakness (generalized): Secondary | ICD-10-CM | POA: Diagnosis not present

## 2017-02-23 DIAGNOSIS — I509 Heart failure, unspecified: Secondary | ICD-10-CM | POA: Diagnosis not present

## 2017-02-24 DIAGNOSIS — R4189 Other symptoms and signs involving cognitive functions and awareness: Secondary | ICD-10-CM | POA: Diagnosis not present

## 2017-02-24 DIAGNOSIS — M6281 Muscle weakness (generalized): Secondary | ICD-10-CM | POA: Diagnosis not present

## 2017-02-24 DIAGNOSIS — I509 Heart failure, unspecified: Secondary | ICD-10-CM | POA: Diagnosis not present

## 2017-02-24 DIAGNOSIS — R1312 Dysphagia, oropharyngeal phase: Secondary | ICD-10-CM | POA: Diagnosis not present

## 2017-02-24 DIAGNOSIS — R41841 Cognitive communication deficit: Secondary | ICD-10-CM | POA: Diagnosis not present

## 2017-02-25 DIAGNOSIS — R41841 Cognitive communication deficit: Secondary | ICD-10-CM | POA: Diagnosis not present

## 2017-02-25 DIAGNOSIS — R1312 Dysphagia, oropharyngeal phase: Secondary | ICD-10-CM | POA: Diagnosis not present

## 2017-02-25 DIAGNOSIS — M6281 Muscle weakness (generalized): Secondary | ICD-10-CM | POA: Diagnosis not present

## 2017-02-25 DIAGNOSIS — I509 Heart failure, unspecified: Secondary | ICD-10-CM | POA: Diagnosis not present

## 2017-02-25 DIAGNOSIS — R4189 Other symptoms and signs involving cognitive functions and awareness: Secondary | ICD-10-CM | POA: Diagnosis not present

## 2017-02-26 DIAGNOSIS — M6281 Muscle weakness (generalized): Secondary | ICD-10-CM | POA: Diagnosis not present

## 2017-02-26 DIAGNOSIS — I509 Heart failure, unspecified: Secondary | ICD-10-CM | POA: Diagnosis not present

## 2017-02-26 DIAGNOSIS — R4189 Other symptoms and signs involving cognitive functions and awareness: Secondary | ICD-10-CM | POA: Diagnosis not present

## 2017-02-26 DIAGNOSIS — R41841 Cognitive communication deficit: Secondary | ICD-10-CM | POA: Diagnosis not present

## 2017-02-26 DIAGNOSIS — R1312 Dysphagia, oropharyngeal phase: Secondary | ICD-10-CM | POA: Diagnosis not present

## 2017-02-27 DIAGNOSIS — R41841 Cognitive communication deficit: Secondary | ICD-10-CM | POA: Diagnosis not present

## 2017-02-27 DIAGNOSIS — R4189 Other symptoms and signs involving cognitive functions and awareness: Secondary | ICD-10-CM | POA: Diagnosis not present

## 2017-02-27 DIAGNOSIS — M6281 Muscle weakness (generalized): Secondary | ICD-10-CM | POA: Diagnosis not present

## 2017-02-27 DIAGNOSIS — I509 Heart failure, unspecified: Secondary | ICD-10-CM | POA: Diagnosis not present

## 2017-02-27 DIAGNOSIS — R1312 Dysphagia, oropharyngeal phase: Secondary | ICD-10-CM | POA: Diagnosis not present

## 2017-03-02 DIAGNOSIS — R4189 Other symptoms and signs involving cognitive functions and awareness: Secondary | ICD-10-CM | POA: Diagnosis not present

## 2017-03-02 DIAGNOSIS — R41841 Cognitive communication deficit: Secondary | ICD-10-CM | POA: Diagnosis not present

## 2017-03-02 DIAGNOSIS — M6281 Muscle weakness (generalized): Secondary | ICD-10-CM | POA: Diagnosis not present

## 2017-03-02 DIAGNOSIS — R1312 Dysphagia, oropharyngeal phase: Secondary | ICD-10-CM | POA: Diagnosis not present

## 2017-03-02 DIAGNOSIS — I509 Heart failure, unspecified: Secondary | ICD-10-CM | POA: Diagnosis not present

## 2017-03-03 DIAGNOSIS — R41841 Cognitive communication deficit: Secondary | ICD-10-CM | POA: Diagnosis not present

## 2017-03-03 DIAGNOSIS — I509 Heart failure, unspecified: Secondary | ICD-10-CM | POA: Diagnosis not present

## 2017-03-03 DIAGNOSIS — R4189 Other symptoms and signs involving cognitive functions and awareness: Secondary | ICD-10-CM | POA: Diagnosis not present

## 2017-03-03 DIAGNOSIS — R1312 Dysphagia, oropharyngeal phase: Secondary | ICD-10-CM | POA: Diagnosis not present

## 2017-03-03 DIAGNOSIS — M6281 Muscle weakness (generalized): Secondary | ICD-10-CM | POA: Diagnosis not present

## 2017-03-04 DIAGNOSIS — M6281 Muscle weakness (generalized): Secondary | ICD-10-CM | POA: Diagnosis not present

## 2017-03-04 DIAGNOSIS — R1312 Dysphagia, oropharyngeal phase: Secondary | ICD-10-CM | POA: Diagnosis not present

## 2017-03-04 DIAGNOSIS — R4189 Other symptoms and signs involving cognitive functions and awareness: Secondary | ICD-10-CM | POA: Diagnosis not present

## 2017-03-04 DIAGNOSIS — I509 Heart failure, unspecified: Secondary | ICD-10-CM | POA: Diagnosis not present

## 2017-03-04 DIAGNOSIS — R41841 Cognitive communication deficit: Secondary | ICD-10-CM | POA: Diagnosis not present

## 2017-03-05 DIAGNOSIS — R4189 Other symptoms and signs involving cognitive functions and awareness: Secondary | ICD-10-CM | POA: Diagnosis not present

## 2017-03-05 DIAGNOSIS — R1312 Dysphagia, oropharyngeal phase: Secondary | ICD-10-CM | POA: Diagnosis not present

## 2017-03-05 DIAGNOSIS — I509 Heart failure, unspecified: Secondary | ICD-10-CM | POA: Diagnosis not present

## 2017-03-05 DIAGNOSIS — R41841 Cognitive communication deficit: Secondary | ICD-10-CM | POA: Diagnosis not present

## 2017-03-05 DIAGNOSIS — M6281 Muscle weakness (generalized): Secondary | ICD-10-CM | POA: Diagnosis not present

## 2017-03-06 DIAGNOSIS — M6281 Muscle weakness (generalized): Secondary | ICD-10-CM | POA: Diagnosis not present

## 2017-03-06 DIAGNOSIS — R41841 Cognitive communication deficit: Secondary | ICD-10-CM | POA: Diagnosis not present

## 2017-03-06 DIAGNOSIS — I509 Heart failure, unspecified: Secondary | ICD-10-CM | POA: Diagnosis not present

## 2017-03-06 DIAGNOSIS — R4189 Other symptoms and signs involving cognitive functions and awareness: Secondary | ICD-10-CM | POA: Diagnosis not present

## 2017-03-06 DIAGNOSIS — R1312 Dysphagia, oropharyngeal phase: Secondary | ICD-10-CM | POA: Diagnosis not present

## 2017-03-10 DIAGNOSIS — R41841 Cognitive communication deficit: Secondary | ICD-10-CM | POA: Diagnosis not present

## 2017-03-10 DIAGNOSIS — M6281 Muscle weakness (generalized): Secondary | ICD-10-CM | POA: Diagnosis not present

## 2017-03-10 DIAGNOSIS — R4189 Other symptoms and signs involving cognitive functions and awareness: Secondary | ICD-10-CM | POA: Diagnosis not present

## 2017-03-10 DIAGNOSIS — I509 Heart failure, unspecified: Secondary | ICD-10-CM | POA: Diagnosis not present

## 2017-03-10 DIAGNOSIS — R1312 Dysphagia, oropharyngeal phase: Secondary | ICD-10-CM | POA: Diagnosis not present

## 2017-03-11 DIAGNOSIS — G894 Chronic pain syndrome: Secondary | ICD-10-CM | POA: Diagnosis not present

## 2017-03-11 DIAGNOSIS — K219 Gastro-esophageal reflux disease without esophagitis: Secondary | ICD-10-CM | POA: Diagnosis not present

## 2017-03-11 DIAGNOSIS — I10 Essential (primary) hypertension: Secondary | ICD-10-CM | POA: Diagnosis not present

## 2017-03-11 DIAGNOSIS — M6281 Muscle weakness (generalized): Secondary | ICD-10-CM | POA: Diagnosis not present

## 2017-03-11 DIAGNOSIS — R41841 Cognitive communication deficit: Secondary | ICD-10-CM | POA: Diagnosis not present

## 2017-03-11 DIAGNOSIS — I509 Heart failure, unspecified: Secondary | ICD-10-CM | POA: Diagnosis not present

## 2017-03-11 DIAGNOSIS — R1312 Dysphagia, oropharyngeal phase: Secondary | ICD-10-CM | POA: Diagnosis not present

## 2017-03-11 DIAGNOSIS — E1140 Type 2 diabetes mellitus with diabetic neuropathy, unspecified: Secondary | ICD-10-CM | POA: Diagnosis not present

## 2017-03-11 DIAGNOSIS — R4189 Other symptoms and signs involving cognitive functions and awareness: Secondary | ICD-10-CM | POA: Diagnosis not present

## 2017-03-25 DIAGNOSIS — D518 Other vitamin B12 deficiency anemias: Secondary | ICD-10-CM | POA: Diagnosis not present

## 2017-03-25 DIAGNOSIS — E7849 Other hyperlipidemia: Secondary | ICD-10-CM | POA: Diagnosis not present

## 2017-03-25 DIAGNOSIS — Z79899 Other long term (current) drug therapy: Secondary | ICD-10-CM | POA: Diagnosis not present

## 2017-03-25 DIAGNOSIS — E119 Type 2 diabetes mellitus without complications: Secondary | ICD-10-CM | POA: Diagnosis not present

## 2017-03-25 DIAGNOSIS — E559 Vitamin D deficiency, unspecified: Secondary | ICD-10-CM | POA: Diagnosis not present

## 2017-04-07 DIAGNOSIS — K219 Gastro-esophageal reflux disease without esophagitis: Secondary | ICD-10-CM | POA: Diagnosis not present

## 2017-04-07 DIAGNOSIS — N3281 Overactive bladder: Secondary | ICD-10-CM | POA: Diagnosis not present

## 2017-04-07 DIAGNOSIS — I10 Essential (primary) hypertension: Secondary | ICD-10-CM | POA: Diagnosis not present

## 2017-04-07 DIAGNOSIS — E1140 Type 2 diabetes mellitus with diabetic neuropathy, unspecified: Secondary | ICD-10-CM | POA: Diagnosis not present

## 2017-04-20 DIAGNOSIS — E119 Type 2 diabetes mellitus without complications: Secondary | ICD-10-CM | POA: Diagnosis not present

## 2017-04-20 DIAGNOSIS — D518 Other vitamin B12 deficiency anemias: Secondary | ICD-10-CM | POA: Diagnosis not present

## 2017-04-20 DIAGNOSIS — Z79899 Other long term (current) drug therapy: Secondary | ICD-10-CM | POA: Diagnosis not present

## 2017-04-20 DIAGNOSIS — E559 Vitamin D deficiency, unspecified: Secondary | ICD-10-CM | POA: Diagnosis not present

## 2017-04-21 DIAGNOSIS — E1140 Type 2 diabetes mellitus with diabetic neuropathy, unspecified: Secondary | ICD-10-CM | POA: Diagnosis not present

## 2017-04-21 DIAGNOSIS — I10 Essential (primary) hypertension: Secondary | ICD-10-CM | POA: Diagnosis not present

## 2017-04-21 DIAGNOSIS — F330 Major depressive disorder, recurrent, mild: Secondary | ICD-10-CM | POA: Diagnosis not present

## 2017-05-05 DIAGNOSIS — I10 Essential (primary) hypertension: Secondary | ICD-10-CM | POA: Diagnosis not present

## 2017-05-05 DIAGNOSIS — E1140 Type 2 diabetes mellitus with diabetic neuropathy, unspecified: Secondary | ICD-10-CM | POA: Diagnosis not present

## 2017-05-05 DIAGNOSIS — K219 Gastro-esophageal reflux disease without esophagitis: Secondary | ICD-10-CM | POA: Diagnosis not present

## 2017-05-05 DIAGNOSIS — I2510 Atherosclerotic heart disease of native coronary artery without angina pectoris: Secondary | ICD-10-CM | POA: Diagnosis not present

## 2017-05-10 DIAGNOSIS — R1312 Dysphagia, oropharyngeal phase: Secondary | ICD-10-CM | POA: Diagnosis not present

## 2017-05-10 DIAGNOSIS — R4189 Other symptoms and signs involving cognitive functions and awareness: Secondary | ICD-10-CM | POA: Diagnosis not present

## 2017-05-10 DIAGNOSIS — M6281 Muscle weakness (generalized): Secondary | ICD-10-CM | POA: Diagnosis not present

## 2017-05-10 DIAGNOSIS — R41841 Cognitive communication deficit: Secondary | ICD-10-CM | POA: Diagnosis not present

## 2017-05-10 DIAGNOSIS — I509 Heart failure, unspecified: Secondary | ICD-10-CM | POA: Diagnosis not present

## 2017-05-12 DIAGNOSIS — I509 Heart failure, unspecified: Secondary | ICD-10-CM | POA: Diagnosis not present

## 2017-05-12 DIAGNOSIS — R41841 Cognitive communication deficit: Secondary | ICD-10-CM | POA: Diagnosis not present

## 2017-05-12 DIAGNOSIS — M6281 Muscle weakness (generalized): Secondary | ICD-10-CM | POA: Diagnosis not present

## 2017-05-12 DIAGNOSIS — R4189 Other symptoms and signs involving cognitive functions and awareness: Secondary | ICD-10-CM | POA: Diagnosis not present

## 2017-05-12 DIAGNOSIS — R1312 Dysphagia, oropharyngeal phase: Secondary | ICD-10-CM | POA: Diagnosis not present

## 2017-05-18 DIAGNOSIS — I509 Heart failure, unspecified: Secondary | ICD-10-CM | POA: Diagnosis not present

## 2017-05-18 DIAGNOSIS — R4189 Other symptoms and signs involving cognitive functions and awareness: Secondary | ICD-10-CM | POA: Diagnosis not present

## 2017-05-18 DIAGNOSIS — M6281 Muscle weakness (generalized): Secondary | ICD-10-CM | POA: Diagnosis not present

## 2017-05-18 DIAGNOSIS — R41841 Cognitive communication deficit: Secondary | ICD-10-CM | POA: Diagnosis not present

## 2017-05-18 DIAGNOSIS — R1312 Dysphagia, oropharyngeal phase: Secondary | ICD-10-CM | POA: Diagnosis not present

## 2017-05-22 DIAGNOSIS — I509 Heart failure, unspecified: Secondary | ICD-10-CM | POA: Diagnosis not present

## 2017-05-22 DIAGNOSIS — R1312 Dysphagia, oropharyngeal phase: Secondary | ICD-10-CM | POA: Diagnosis not present

## 2017-05-22 DIAGNOSIS — M6281 Muscle weakness (generalized): Secondary | ICD-10-CM | POA: Diagnosis not present

## 2017-05-22 DIAGNOSIS — R41841 Cognitive communication deficit: Secondary | ICD-10-CM | POA: Diagnosis not present

## 2017-05-22 DIAGNOSIS — R4189 Other symptoms and signs involving cognitive functions and awareness: Secondary | ICD-10-CM | POA: Diagnosis not present

## 2017-05-25 DIAGNOSIS — I509 Heart failure, unspecified: Secondary | ICD-10-CM | POA: Diagnosis not present

## 2017-05-25 DIAGNOSIS — M6281 Muscle weakness (generalized): Secondary | ICD-10-CM | POA: Diagnosis not present

## 2017-05-25 DIAGNOSIS — R4189 Other symptoms and signs involving cognitive functions and awareness: Secondary | ICD-10-CM | POA: Diagnosis not present

## 2017-05-25 DIAGNOSIS — R41841 Cognitive communication deficit: Secondary | ICD-10-CM | POA: Diagnosis not present

## 2017-05-25 DIAGNOSIS — R1312 Dysphagia, oropharyngeal phase: Secondary | ICD-10-CM | POA: Diagnosis not present

## 2017-05-26 DIAGNOSIS — E113552 Type 2 diabetes mellitus with stable proliferative diabetic retinopathy, left eye: Secondary | ICD-10-CM | POA: Diagnosis not present

## 2017-05-26 DIAGNOSIS — H43811 Vitreous degeneration, right eye: Secondary | ICD-10-CM | POA: Diagnosis not present

## 2017-05-26 DIAGNOSIS — E113551 Type 2 diabetes mellitus with stable proliferative diabetic retinopathy, right eye: Secondary | ICD-10-CM | POA: Diagnosis not present

## 2017-05-26 DIAGNOSIS — H35043 Retinal micro-aneurysms, unspecified, bilateral: Secondary | ICD-10-CM | POA: Diagnosis not present

## 2017-05-27 DIAGNOSIS — M6281 Muscle weakness (generalized): Secondary | ICD-10-CM | POA: Diagnosis not present

## 2017-05-27 DIAGNOSIS — R41841 Cognitive communication deficit: Secondary | ICD-10-CM | POA: Diagnosis not present

## 2017-05-27 DIAGNOSIS — R4189 Other symptoms and signs involving cognitive functions and awareness: Secondary | ICD-10-CM | POA: Diagnosis not present

## 2017-05-27 DIAGNOSIS — I509 Heart failure, unspecified: Secondary | ICD-10-CM | POA: Diagnosis not present

## 2017-05-27 DIAGNOSIS — R1312 Dysphagia, oropharyngeal phase: Secondary | ICD-10-CM | POA: Diagnosis not present

## 2017-06-01 DIAGNOSIS — I10 Essential (primary) hypertension: Secondary | ICD-10-CM | POA: Diagnosis not present

## 2017-06-01 DIAGNOSIS — E1140 Type 2 diabetes mellitus with diabetic neuropathy, unspecified: Secondary | ICD-10-CM | POA: Diagnosis not present

## 2017-06-01 DIAGNOSIS — K219 Gastro-esophageal reflux disease without esophagitis: Secondary | ICD-10-CM | POA: Diagnosis not present

## 2017-06-01 DIAGNOSIS — G894 Chronic pain syndrome: Secondary | ICD-10-CM | POA: Diagnosis not present

## 2017-06-02 DIAGNOSIS — R1312 Dysphagia, oropharyngeal phase: Secondary | ICD-10-CM | POA: Diagnosis not present

## 2017-06-02 DIAGNOSIS — I509 Heart failure, unspecified: Secondary | ICD-10-CM | POA: Diagnosis not present

## 2017-06-02 DIAGNOSIS — R41841 Cognitive communication deficit: Secondary | ICD-10-CM | POA: Diagnosis not present

## 2017-06-02 DIAGNOSIS — M6281 Muscle weakness (generalized): Secondary | ICD-10-CM | POA: Diagnosis not present

## 2017-06-02 DIAGNOSIS — R4189 Other symptoms and signs involving cognitive functions and awareness: Secondary | ICD-10-CM | POA: Diagnosis not present

## 2017-06-04 DIAGNOSIS — R41841 Cognitive communication deficit: Secondary | ICD-10-CM | POA: Diagnosis not present

## 2017-06-04 DIAGNOSIS — I509 Heart failure, unspecified: Secondary | ICD-10-CM | POA: Diagnosis not present

## 2017-06-04 DIAGNOSIS — M6281 Muscle weakness (generalized): Secondary | ICD-10-CM | POA: Diagnosis not present

## 2017-06-04 DIAGNOSIS — R4189 Other symptoms and signs involving cognitive functions and awareness: Secondary | ICD-10-CM | POA: Diagnosis not present

## 2017-06-04 DIAGNOSIS — R1312 Dysphagia, oropharyngeal phase: Secondary | ICD-10-CM | POA: Diagnosis not present

## 2017-06-06 DIAGNOSIS — E119 Type 2 diabetes mellitus without complications: Secondary | ICD-10-CM | POA: Diagnosis not present

## 2017-06-22 DIAGNOSIS — M6281 Muscle weakness (generalized): Secondary | ICD-10-CM | POA: Diagnosis not present

## 2017-06-22 DIAGNOSIS — R1312 Dysphagia, oropharyngeal phase: Secondary | ICD-10-CM | POA: Diagnosis not present

## 2017-06-22 DIAGNOSIS — R41841 Cognitive communication deficit: Secondary | ICD-10-CM | POA: Diagnosis not present

## 2017-06-22 DIAGNOSIS — R4189 Other symptoms and signs involving cognitive functions and awareness: Secondary | ICD-10-CM | POA: Diagnosis not present

## 2017-06-22 DIAGNOSIS — I509 Heart failure, unspecified: Secondary | ICD-10-CM | POA: Diagnosis not present

## 2017-06-23 DIAGNOSIS — I509 Heart failure, unspecified: Secondary | ICD-10-CM | POA: Diagnosis not present

## 2017-06-23 DIAGNOSIS — R1312 Dysphagia, oropharyngeal phase: Secondary | ICD-10-CM | POA: Diagnosis not present

## 2017-06-23 DIAGNOSIS — M6281 Muscle weakness (generalized): Secondary | ICD-10-CM | POA: Diagnosis not present

## 2017-06-23 DIAGNOSIS — R4189 Other symptoms and signs involving cognitive functions and awareness: Secondary | ICD-10-CM | POA: Diagnosis not present

## 2017-06-23 DIAGNOSIS — R41841 Cognitive communication deficit: Secondary | ICD-10-CM | POA: Diagnosis not present

## 2017-06-24 DIAGNOSIS — R4189 Other symptoms and signs involving cognitive functions and awareness: Secondary | ICD-10-CM | POA: Diagnosis not present

## 2017-06-24 DIAGNOSIS — R1312 Dysphagia, oropharyngeal phase: Secondary | ICD-10-CM | POA: Diagnosis not present

## 2017-06-24 DIAGNOSIS — M6281 Muscle weakness (generalized): Secondary | ICD-10-CM | POA: Diagnosis not present

## 2017-06-24 DIAGNOSIS — I509 Heart failure, unspecified: Secondary | ICD-10-CM | POA: Diagnosis not present

## 2017-06-24 DIAGNOSIS — R41841 Cognitive communication deficit: Secondary | ICD-10-CM | POA: Diagnosis not present

## 2017-06-29 DIAGNOSIS — N3281 Overactive bladder: Secondary | ICD-10-CM | POA: Diagnosis not present

## 2017-06-29 DIAGNOSIS — K219 Gastro-esophageal reflux disease without esophagitis: Secondary | ICD-10-CM | POA: Diagnosis not present

## 2017-06-29 DIAGNOSIS — I10 Essential (primary) hypertension: Secondary | ICD-10-CM | POA: Diagnosis not present

## 2017-06-29 DIAGNOSIS — E1140 Type 2 diabetes mellitus with diabetic neuropathy, unspecified: Secondary | ICD-10-CM | POA: Diagnosis not present

## 2017-07-02 DIAGNOSIS — R1312 Dysphagia, oropharyngeal phase: Secondary | ICD-10-CM | POA: Diagnosis not present

## 2017-07-02 DIAGNOSIS — R41841 Cognitive communication deficit: Secondary | ICD-10-CM | POA: Diagnosis not present

## 2017-07-02 DIAGNOSIS — R4189 Other symptoms and signs involving cognitive functions and awareness: Secondary | ICD-10-CM | POA: Diagnosis not present

## 2017-07-02 DIAGNOSIS — M6281 Muscle weakness (generalized): Secondary | ICD-10-CM | POA: Diagnosis not present

## 2017-07-02 DIAGNOSIS — I509 Heart failure, unspecified: Secondary | ICD-10-CM | POA: Diagnosis not present

## 2017-07-03 DIAGNOSIS — I509 Heart failure, unspecified: Secondary | ICD-10-CM | POA: Diagnosis not present

## 2017-07-03 DIAGNOSIS — R1312 Dysphagia, oropharyngeal phase: Secondary | ICD-10-CM | POA: Diagnosis not present

## 2017-07-03 DIAGNOSIS — R41841 Cognitive communication deficit: Secondary | ICD-10-CM | POA: Diagnosis not present

## 2017-07-03 DIAGNOSIS — M6281 Muscle weakness (generalized): Secondary | ICD-10-CM | POA: Diagnosis not present

## 2017-07-03 DIAGNOSIS — R4189 Other symptoms and signs involving cognitive functions and awareness: Secondary | ICD-10-CM | POA: Diagnosis not present

## 2017-07-06 DIAGNOSIS — R1312 Dysphagia, oropharyngeal phase: Secondary | ICD-10-CM | POA: Diagnosis not present

## 2017-07-06 DIAGNOSIS — R4189 Other symptoms and signs involving cognitive functions and awareness: Secondary | ICD-10-CM | POA: Diagnosis not present

## 2017-07-06 DIAGNOSIS — R41841 Cognitive communication deficit: Secondary | ICD-10-CM | POA: Diagnosis not present

## 2017-07-06 DIAGNOSIS — I509 Heart failure, unspecified: Secondary | ICD-10-CM | POA: Diagnosis not present

## 2017-07-06 DIAGNOSIS — M6281 Muscle weakness (generalized): Secondary | ICD-10-CM | POA: Diagnosis not present

## 2017-07-07 DIAGNOSIS — R4189 Other symptoms and signs involving cognitive functions and awareness: Secondary | ICD-10-CM | POA: Diagnosis not present

## 2017-07-07 DIAGNOSIS — I509 Heart failure, unspecified: Secondary | ICD-10-CM | POA: Diagnosis not present

## 2017-07-07 DIAGNOSIS — R1907 Generalized intra-abdominal and pelvic swelling, mass and lump: Secondary | ICD-10-CM | POA: Diagnosis not present

## 2017-07-07 DIAGNOSIS — D649 Anemia, unspecified: Secondary | ICD-10-CM | POA: Diagnosis not present

## 2017-07-07 DIAGNOSIS — R1312 Dysphagia, oropharyngeal phase: Secondary | ICD-10-CM | POA: Diagnosis not present

## 2017-07-07 DIAGNOSIS — R41841 Cognitive communication deficit: Secondary | ICD-10-CM | POA: Diagnosis not present

## 2017-07-07 DIAGNOSIS — M6281 Muscle weakness (generalized): Secondary | ICD-10-CM | POA: Diagnosis not present

## 2017-07-08 DIAGNOSIS — M6281 Muscle weakness (generalized): Secondary | ICD-10-CM | POA: Diagnosis not present

## 2017-07-08 DIAGNOSIS — A0472 Enterocolitis due to Clostridium difficile, not specified as recurrent: Secondary | ICD-10-CM | POA: Diagnosis not present

## 2017-07-08 DIAGNOSIS — R1312 Dysphagia, oropharyngeal phase: Secondary | ICD-10-CM | POA: Diagnosis not present

## 2017-07-08 DIAGNOSIS — R4189 Other symptoms and signs involving cognitive functions and awareness: Secondary | ICD-10-CM | POA: Diagnosis not present

## 2017-07-08 DIAGNOSIS — R41841 Cognitive communication deficit: Secondary | ICD-10-CM | POA: Diagnosis not present

## 2017-07-08 DIAGNOSIS — I509 Heart failure, unspecified: Secondary | ICD-10-CM | POA: Diagnosis not present

## 2017-07-14 DIAGNOSIS — I509 Heart failure, unspecified: Secondary | ICD-10-CM | POA: Diagnosis not present

## 2017-07-14 DIAGNOSIS — R4189 Other symptoms and signs involving cognitive functions and awareness: Secondary | ICD-10-CM | POA: Diagnosis not present

## 2017-07-14 DIAGNOSIS — R41841 Cognitive communication deficit: Secondary | ICD-10-CM | POA: Diagnosis not present

## 2017-07-14 DIAGNOSIS — R1312 Dysphagia, oropharyngeal phase: Secondary | ICD-10-CM | POA: Diagnosis not present

## 2017-07-14 DIAGNOSIS — M6281 Muscle weakness (generalized): Secondary | ICD-10-CM | POA: Diagnosis not present

## 2017-07-15 DIAGNOSIS — R4189 Other symptoms and signs involving cognitive functions and awareness: Secondary | ICD-10-CM | POA: Diagnosis not present

## 2017-07-15 DIAGNOSIS — M6281 Muscle weakness (generalized): Secondary | ICD-10-CM | POA: Diagnosis not present

## 2017-07-15 DIAGNOSIS — R1312 Dysphagia, oropharyngeal phase: Secondary | ICD-10-CM | POA: Diagnosis not present

## 2017-07-15 DIAGNOSIS — I509 Heart failure, unspecified: Secondary | ICD-10-CM | POA: Diagnosis not present

## 2017-07-15 DIAGNOSIS — R41841 Cognitive communication deficit: Secondary | ICD-10-CM | POA: Diagnosis not present

## 2017-07-16 DIAGNOSIS — I509 Heart failure, unspecified: Secondary | ICD-10-CM | POA: Diagnosis not present

## 2017-07-16 DIAGNOSIS — M6281 Muscle weakness (generalized): Secondary | ICD-10-CM | POA: Diagnosis not present

## 2017-07-16 DIAGNOSIS — R1312 Dysphagia, oropharyngeal phase: Secondary | ICD-10-CM | POA: Diagnosis not present

## 2017-07-16 DIAGNOSIS — R41841 Cognitive communication deficit: Secondary | ICD-10-CM | POA: Diagnosis not present

## 2017-07-16 DIAGNOSIS — R4189 Other symptoms and signs involving cognitive functions and awareness: Secondary | ICD-10-CM | POA: Diagnosis not present

## 2017-07-20 DIAGNOSIS — R4189 Other symptoms and signs involving cognitive functions and awareness: Secondary | ICD-10-CM | POA: Diagnosis not present

## 2017-07-20 DIAGNOSIS — R1312 Dysphagia, oropharyngeal phase: Secondary | ICD-10-CM | POA: Diagnosis not present

## 2017-07-20 DIAGNOSIS — R41841 Cognitive communication deficit: Secondary | ICD-10-CM | POA: Diagnosis not present

## 2017-07-20 DIAGNOSIS — S81809A Unspecified open wound, unspecified lower leg, initial encounter: Secondary | ICD-10-CM | POA: Diagnosis not present

## 2017-07-20 DIAGNOSIS — M6281 Muscle weakness (generalized): Secondary | ICD-10-CM | POA: Diagnosis not present

## 2017-07-20 DIAGNOSIS — I509 Heart failure, unspecified: Secondary | ICD-10-CM | POA: Diagnosis not present

## 2017-07-22 DIAGNOSIS — R1312 Dysphagia, oropharyngeal phase: Secondary | ICD-10-CM | POA: Diagnosis not present

## 2017-07-22 DIAGNOSIS — I509 Heart failure, unspecified: Secondary | ICD-10-CM | POA: Diagnosis not present

## 2017-07-22 DIAGNOSIS — R4189 Other symptoms and signs involving cognitive functions and awareness: Secondary | ICD-10-CM | POA: Diagnosis not present

## 2017-07-22 DIAGNOSIS — M6281 Muscle weakness (generalized): Secondary | ICD-10-CM | POA: Diagnosis not present

## 2017-07-22 DIAGNOSIS — R41841 Cognitive communication deficit: Secondary | ICD-10-CM | POA: Diagnosis not present

## 2017-07-25 DIAGNOSIS — R4189 Other symptoms and signs involving cognitive functions and awareness: Secondary | ICD-10-CM | POA: Diagnosis not present

## 2017-07-25 DIAGNOSIS — R1312 Dysphagia, oropharyngeal phase: Secondary | ICD-10-CM | POA: Diagnosis not present

## 2017-07-25 DIAGNOSIS — R41841 Cognitive communication deficit: Secondary | ICD-10-CM | POA: Diagnosis not present

## 2017-07-25 DIAGNOSIS — M6281 Muscle weakness (generalized): Secondary | ICD-10-CM | POA: Diagnosis not present

## 2017-07-25 DIAGNOSIS — I509 Heart failure, unspecified: Secondary | ICD-10-CM | POA: Diagnosis not present

## 2017-07-27 DIAGNOSIS — E1140 Type 2 diabetes mellitus with diabetic neuropathy, unspecified: Secondary | ICD-10-CM | POA: Diagnosis not present

## 2017-07-27 DIAGNOSIS — K219 Gastro-esophageal reflux disease without esophagitis: Secondary | ICD-10-CM | POA: Diagnosis not present

## 2017-07-27 DIAGNOSIS — N3281 Overactive bladder: Secondary | ICD-10-CM | POA: Diagnosis not present

## 2017-07-27 DIAGNOSIS — I10 Essential (primary) hypertension: Secondary | ICD-10-CM | POA: Diagnosis not present

## 2017-07-30 DIAGNOSIS — R41841 Cognitive communication deficit: Secondary | ICD-10-CM | POA: Diagnosis not present

## 2017-07-30 DIAGNOSIS — M6281 Muscle weakness (generalized): Secondary | ICD-10-CM | POA: Diagnosis not present

## 2017-07-30 DIAGNOSIS — I509 Heart failure, unspecified: Secondary | ICD-10-CM | POA: Diagnosis not present

## 2017-07-30 DIAGNOSIS — R4189 Other symptoms and signs involving cognitive functions and awareness: Secondary | ICD-10-CM | POA: Diagnosis not present

## 2017-07-30 DIAGNOSIS — R1312 Dysphagia, oropharyngeal phase: Secondary | ICD-10-CM | POA: Diagnosis not present

## 2017-07-31 DIAGNOSIS — I509 Heart failure, unspecified: Secondary | ICD-10-CM | POA: Diagnosis not present

## 2017-07-31 DIAGNOSIS — R4189 Other symptoms and signs involving cognitive functions and awareness: Secondary | ICD-10-CM | POA: Diagnosis not present

## 2017-07-31 DIAGNOSIS — M6281 Muscle weakness (generalized): Secondary | ICD-10-CM | POA: Diagnosis not present

## 2017-07-31 DIAGNOSIS — R41841 Cognitive communication deficit: Secondary | ICD-10-CM | POA: Diagnosis not present

## 2017-07-31 DIAGNOSIS — R1312 Dysphagia, oropharyngeal phase: Secondary | ICD-10-CM | POA: Diagnosis not present

## 2017-08-01 DIAGNOSIS — M6281 Muscle weakness (generalized): Secondary | ICD-10-CM | POA: Diagnosis not present

## 2017-08-01 DIAGNOSIS — R4189 Other symptoms and signs involving cognitive functions and awareness: Secondary | ICD-10-CM | POA: Diagnosis not present

## 2017-08-01 DIAGNOSIS — R41841 Cognitive communication deficit: Secondary | ICD-10-CM | POA: Diagnosis not present

## 2017-08-01 DIAGNOSIS — I509 Heart failure, unspecified: Secondary | ICD-10-CM | POA: Diagnosis not present

## 2017-08-01 DIAGNOSIS — R1312 Dysphagia, oropharyngeal phase: Secondary | ICD-10-CM | POA: Diagnosis not present

## 2017-08-03 DIAGNOSIS — R41841 Cognitive communication deficit: Secondary | ICD-10-CM | POA: Diagnosis not present

## 2017-08-03 DIAGNOSIS — R4189 Other symptoms and signs involving cognitive functions and awareness: Secondary | ICD-10-CM | POA: Diagnosis not present

## 2017-08-03 DIAGNOSIS — R1312 Dysphagia, oropharyngeal phase: Secondary | ICD-10-CM | POA: Diagnosis not present

## 2017-08-03 DIAGNOSIS — I509 Heart failure, unspecified: Secondary | ICD-10-CM | POA: Diagnosis not present

## 2017-08-03 DIAGNOSIS — M6281 Muscle weakness (generalized): Secondary | ICD-10-CM | POA: Diagnosis not present

## 2017-08-05 DIAGNOSIS — R4189 Other symptoms and signs involving cognitive functions and awareness: Secondary | ICD-10-CM | POA: Diagnosis not present

## 2017-08-05 DIAGNOSIS — R41841 Cognitive communication deficit: Secondary | ICD-10-CM | POA: Diagnosis not present

## 2017-08-05 DIAGNOSIS — R1312 Dysphagia, oropharyngeal phase: Secondary | ICD-10-CM | POA: Diagnosis not present

## 2017-08-05 DIAGNOSIS — I509 Heart failure, unspecified: Secondary | ICD-10-CM | POA: Diagnosis not present

## 2017-08-05 DIAGNOSIS — M6281 Muscle weakness (generalized): Secondary | ICD-10-CM | POA: Diagnosis not present

## 2017-08-07 DIAGNOSIS — M6281 Muscle weakness (generalized): Secondary | ICD-10-CM | POA: Diagnosis not present

## 2017-08-07 DIAGNOSIS — R4189 Other symptoms and signs involving cognitive functions and awareness: Secondary | ICD-10-CM | POA: Diagnosis not present

## 2017-08-07 DIAGNOSIS — I509 Heart failure, unspecified: Secondary | ICD-10-CM | POA: Diagnosis not present

## 2017-08-07 DIAGNOSIS — R1312 Dysphagia, oropharyngeal phase: Secondary | ICD-10-CM | POA: Diagnosis not present

## 2017-08-07 DIAGNOSIS — R41841 Cognitive communication deficit: Secondary | ICD-10-CM | POA: Diagnosis not present

## 2017-08-11 DIAGNOSIS — R41841 Cognitive communication deficit: Secondary | ICD-10-CM | POA: Diagnosis not present

## 2017-08-11 DIAGNOSIS — R4189 Other symptoms and signs involving cognitive functions and awareness: Secondary | ICD-10-CM | POA: Diagnosis not present

## 2017-08-11 DIAGNOSIS — M6281 Muscle weakness (generalized): Secondary | ICD-10-CM | POA: Diagnosis not present

## 2017-08-11 DIAGNOSIS — R1312 Dysphagia, oropharyngeal phase: Secondary | ICD-10-CM | POA: Diagnosis not present

## 2017-08-11 DIAGNOSIS — I509 Heart failure, unspecified: Secondary | ICD-10-CM | POA: Diagnosis not present

## 2017-08-12 DIAGNOSIS — I509 Heart failure, unspecified: Secondary | ICD-10-CM | POA: Diagnosis not present

## 2017-08-12 DIAGNOSIS — R1312 Dysphagia, oropharyngeal phase: Secondary | ICD-10-CM | POA: Diagnosis not present

## 2017-08-12 DIAGNOSIS — M6281 Muscle weakness (generalized): Secondary | ICD-10-CM | POA: Diagnosis not present

## 2017-08-12 DIAGNOSIS — R4189 Other symptoms and signs involving cognitive functions and awareness: Secondary | ICD-10-CM | POA: Diagnosis not present

## 2017-08-12 DIAGNOSIS — R41841 Cognitive communication deficit: Secondary | ICD-10-CM | POA: Diagnosis not present

## 2017-08-13 DIAGNOSIS — R41841 Cognitive communication deficit: Secondary | ICD-10-CM | POA: Diagnosis not present

## 2017-08-13 DIAGNOSIS — R4189 Other symptoms and signs involving cognitive functions and awareness: Secondary | ICD-10-CM | POA: Diagnosis not present

## 2017-08-13 DIAGNOSIS — R1312 Dysphagia, oropharyngeal phase: Secondary | ICD-10-CM | POA: Diagnosis not present

## 2017-08-13 DIAGNOSIS — M6281 Muscle weakness (generalized): Secondary | ICD-10-CM | POA: Diagnosis not present

## 2017-08-13 DIAGNOSIS — I509 Heart failure, unspecified: Secondary | ICD-10-CM | POA: Diagnosis not present

## 2017-08-18 DIAGNOSIS — M6281 Muscle weakness (generalized): Secondary | ICD-10-CM | POA: Diagnosis not present

## 2017-08-18 DIAGNOSIS — R4189 Other symptoms and signs involving cognitive functions and awareness: Secondary | ICD-10-CM | POA: Diagnosis not present

## 2017-08-18 DIAGNOSIS — I509 Heart failure, unspecified: Secondary | ICD-10-CM | POA: Diagnosis not present

## 2017-08-18 DIAGNOSIS — R1312 Dysphagia, oropharyngeal phase: Secondary | ICD-10-CM | POA: Diagnosis not present

## 2017-08-18 DIAGNOSIS — R41841 Cognitive communication deficit: Secondary | ICD-10-CM | POA: Diagnosis not present

## 2017-08-19 DIAGNOSIS — N3281 Overactive bladder: Secondary | ICD-10-CM | POA: Diagnosis not present

## 2017-08-19 DIAGNOSIS — R1312 Dysphagia, oropharyngeal phase: Secondary | ICD-10-CM | POA: Diagnosis not present

## 2017-08-19 DIAGNOSIS — E1140 Type 2 diabetes mellitus with diabetic neuropathy, unspecified: Secondary | ICD-10-CM | POA: Diagnosis not present

## 2017-08-19 DIAGNOSIS — R41841 Cognitive communication deficit: Secondary | ICD-10-CM | POA: Diagnosis not present

## 2017-08-19 DIAGNOSIS — I10 Essential (primary) hypertension: Secondary | ICD-10-CM | POA: Diagnosis not present

## 2017-08-19 DIAGNOSIS — R4189 Other symptoms and signs involving cognitive functions and awareness: Secondary | ICD-10-CM | POA: Diagnosis not present

## 2017-08-19 DIAGNOSIS — K219 Gastro-esophageal reflux disease without esophagitis: Secondary | ICD-10-CM | POA: Diagnosis not present

## 2017-08-19 DIAGNOSIS — I509 Heart failure, unspecified: Secondary | ICD-10-CM | POA: Diagnosis not present

## 2017-08-19 DIAGNOSIS — M6281 Muscle weakness (generalized): Secondary | ICD-10-CM | POA: Diagnosis not present

## 2017-08-24 DIAGNOSIS — I509 Heart failure, unspecified: Secondary | ICD-10-CM | POA: Diagnosis not present

## 2017-08-24 DIAGNOSIS — R41841 Cognitive communication deficit: Secondary | ICD-10-CM | POA: Diagnosis not present

## 2017-08-24 DIAGNOSIS — R1312 Dysphagia, oropharyngeal phase: Secondary | ICD-10-CM | POA: Diagnosis not present

## 2017-08-24 DIAGNOSIS — M6281 Muscle weakness (generalized): Secondary | ICD-10-CM | POA: Diagnosis not present

## 2017-08-24 DIAGNOSIS — R4189 Other symptoms and signs involving cognitive functions and awareness: Secondary | ICD-10-CM | POA: Diagnosis not present

## 2017-08-25 DIAGNOSIS — R1312 Dysphagia, oropharyngeal phase: Secondary | ICD-10-CM | POA: Diagnosis not present

## 2017-08-25 DIAGNOSIS — R4189 Other symptoms and signs involving cognitive functions and awareness: Secondary | ICD-10-CM | POA: Diagnosis not present

## 2017-08-25 DIAGNOSIS — M6281 Muscle weakness (generalized): Secondary | ICD-10-CM | POA: Diagnosis not present

## 2017-08-25 DIAGNOSIS — R41841 Cognitive communication deficit: Secondary | ICD-10-CM | POA: Diagnosis not present

## 2017-08-25 DIAGNOSIS — I509 Heart failure, unspecified: Secondary | ICD-10-CM | POA: Diagnosis not present

## 2017-08-27 DIAGNOSIS — R1312 Dysphagia, oropharyngeal phase: Secondary | ICD-10-CM | POA: Diagnosis not present

## 2017-08-27 DIAGNOSIS — R41841 Cognitive communication deficit: Secondary | ICD-10-CM | POA: Diagnosis not present

## 2017-08-27 DIAGNOSIS — I509 Heart failure, unspecified: Secondary | ICD-10-CM | POA: Diagnosis not present

## 2017-08-27 DIAGNOSIS — M6281 Muscle weakness (generalized): Secondary | ICD-10-CM | POA: Diagnosis not present

## 2017-08-27 DIAGNOSIS — R4189 Other symptoms and signs involving cognitive functions and awareness: Secondary | ICD-10-CM | POA: Diagnosis not present

## 2017-08-31 DIAGNOSIS — R41841 Cognitive communication deficit: Secondary | ICD-10-CM | POA: Diagnosis not present

## 2017-08-31 DIAGNOSIS — R1312 Dysphagia, oropharyngeal phase: Secondary | ICD-10-CM | POA: Diagnosis not present

## 2017-08-31 DIAGNOSIS — R4189 Other symptoms and signs involving cognitive functions and awareness: Secondary | ICD-10-CM | POA: Diagnosis not present

## 2017-08-31 DIAGNOSIS — M6281 Muscle weakness (generalized): Secondary | ICD-10-CM | POA: Diagnosis not present

## 2017-08-31 DIAGNOSIS — I6789 Other cerebrovascular disease: Secondary | ICD-10-CM | POA: Diagnosis not present

## 2017-08-31 DIAGNOSIS — I509 Heart failure, unspecified: Secondary | ICD-10-CM | POA: Diagnosis not present

## 2017-09-01 DIAGNOSIS — I6789 Other cerebrovascular disease: Secondary | ICD-10-CM | POA: Diagnosis not present

## 2017-09-01 DIAGNOSIS — R41841 Cognitive communication deficit: Secondary | ICD-10-CM | POA: Diagnosis not present

## 2017-09-01 DIAGNOSIS — R1312 Dysphagia, oropharyngeal phase: Secondary | ICD-10-CM | POA: Diagnosis not present

## 2017-09-01 DIAGNOSIS — I509 Heart failure, unspecified: Secondary | ICD-10-CM | POA: Diagnosis not present

## 2017-09-01 DIAGNOSIS — R4189 Other symptoms and signs involving cognitive functions and awareness: Secondary | ICD-10-CM | POA: Diagnosis not present

## 2017-09-01 DIAGNOSIS — M6281 Muscle weakness (generalized): Secondary | ICD-10-CM | POA: Diagnosis not present

## 2017-09-05 DIAGNOSIS — R1312 Dysphagia, oropharyngeal phase: Secondary | ICD-10-CM | POA: Diagnosis not present

## 2017-09-05 DIAGNOSIS — I509 Heart failure, unspecified: Secondary | ICD-10-CM | POA: Diagnosis not present

## 2017-09-05 DIAGNOSIS — R41841 Cognitive communication deficit: Secondary | ICD-10-CM | POA: Diagnosis not present

## 2017-09-05 DIAGNOSIS — M6281 Muscle weakness (generalized): Secondary | ICD-10-CM | POA: Diagnosis not present

## 2017-09-05 DIAGNOSIS — R4189 Other symptoms and signs involving cognitive functions and awareness: Secondary | ICD-10-CM | POA: Diagnosis not present

## 2017-09-05 DIAGNOSIS — I6789 Other cerebrovascular disease: Secondary | ICD-10-CM | POA: Diagnosis not present

## 2017-09-08 DIAGNOSIS — R41841 Cognitive communication deficit: Secondary | ICD-10-CM | POA: Diagnosis not present

## 2017-09-08 DIAGNOSIS — R1312 Dysphagia, oropharyngeal phase: Secondary | ICD-10-CM | POA: Diagnosis not present

## 2017-09-08 DIAGNOSIS — M6281 Muscle weakness (generalized): Secondary | ICD-10-CM | POA: Diagnosis not present

## 2017-09-08 DIAGNOSIS — I6789 Other cerebrovascular disease: Secondary | ICD-10-CM | POA: Diagnosis not present

## 2017-09-08 DIAGNOSIS — R4189 Other symptoms and signs involving cognitive functions and awareness: Secondary | ICD-10-CM | POA: Diagnosis not present

## 2017-09-08 DIAGNOSIS — I509 Heart failure, unspecified: Secondary | ICD-10-CM | POA: Diagnosis not present

## 2017-09-09 DIAGNOSIS — E1140 Type 2 diabetes mellitus with diabetic neuropathy, unspecified: Secondary | ICD-10-CM | POA: Diagnosis not present

## 2017-09-09 DIAGNOSIS — K219 Gastro-esophageal reflux disease without esophagitis: Secondary | ICD-10-CM | POA: Diagnosis not present

## 2017-09-09 DIAGNOSIS — I10 Essential (primary) hypertension: Secondary | ICD-10-CM | POA: Diagnosis not present

## 2017-09-09 DIAGNOSIS — N3281 Overactive bladder: Secondary | ICD-10-CM | POA: Diagnosis not present

## 2017-09-10 DIAGNOSIS — R4189 Other symptoms and signs involving cognitive functions and awareness: Secondary | ICD-10-CM | POA: Diagnosis not present

## 2017-09-10 DIAGNOSIS — I6789 Other cerebrovascular disease: Secondary | ICD-10-CM | POA: Diagnosis not present

## 2017-09-10 DIAGNOSIS — M6281 Muscle weakness (generalized): Secondary | ICD-10-CM | POA: Diagnosis not present

## 2017-09-10 DIAGNOSIS — R1312 Dysphagia, oropharyngeal phase: Secondary | ICD-10-CM | POA: Diagnosis not present

## 2017-09-10 DIAGNOSIS — R41841 Cognitive communication deficit: Secondary | ICD-10-CM | POA: Diagnosis not present

## 2017-09-10 DIAGNOSIS — I509 Heart failure, unspecified: Secondary | ICD-10-CM | POA: Diagnosis not present

## 2017-09-11 DIAGNOSIS — I509 Heart failure, unspecified: Secondary | ICD-10-CM | POA: Diagnosis not present

## 2017-09-11 DIAGNOSIS — I6789 Other cerebrovascular disease: Secondary | ICD-10-CM | POA: Diagnosis not present

## 2017-09-11 DIAGNOSIS — R1312 Dysphagia, oropharyngeal phase: Secondary | ICD-10-CM | POA: Diagnosis not present

## 2017-09-11 DIAGNOSIS — M6281 Muscle weakness (generalized): Secondary | ICD-10-CM | POA: Diagnosis not present

## 2017-09-11 DIAGNOSIS — R4189 Other symptoms and signs involving cognitive functions and awareness: Secondary | ICD-10-CM | POA: Diagnosis not present

## 2017-09-11 DIAGNOSIS — R41841 Cognitive communication deficit: Secondary | ICD-10-CM | POA: Diagnosis not present

## 2017-09-17 DIAGNOSIS — E559 Vitamin D deficiency, unspecified: Secondary | ICD-10-CM | POA: Diagnosis not present

## 2017-09-17 DIAGNOSIS — I10 Essential (primary) hypertension: Secondary | ICD-10-CM | POA: Diagnosis not present

## 2017-09-17 DIAGNOSIS — E119 Type 2 diabetes mellitus without complications: Secondary | ICD-10-CM | POA: Diagnosis not present

## 2017-09-17 DIAGNOSIS — D649 Anemia, unspecified: Secondary | ICD-10-CM | POA: Diagnosis not present

## 2017-10-05 DIAGNOSIS — I2510 Atherosclerotic heart disease of native coronary artery without angina pectoris: Secondary | ICD-10-CM | POA: Diagnosis not present

## 2017-10-05 DIAGNOSIS — I10 Essential (primary) hypertension: Secondary | ICD-10-CM | POA: Diagnosis not present

## 2017-10-05 DIAGNOSIS — K219 Gastro-esophageal reflux disease without esophagitis: Secondary | ICD-10-CM | POA: Diagnosis not present

## 2017-10-05 DIAGNOSIS — E1140 Type 2 diabetes mellitus with diabetic neuropathy, unspecified: Secondary | ICD-10-CM | POA: Diagnosis not present

## 2017-11-02 DIAGNOSIS — I10 Essential (primary) hypertension: Secondary | ICD-10-CM | POA: Diagnosis not present

## 2017-11-02 DIAGNOSIS — I2510 Atherosclerotic heart disease of native coronary artery without angina pectoris: Secondary | ICD-10-CM | POA: Diagnosis not present

## 2017-11-02 DIAGNOSIS — E1140 Type 2 diabetes mellitus with diabetic neuropathy, unspecified: Secondary | ICD-10-CM | POA: Diagnosis not present

## 2017-11-02 DIAGNOSIS — K219 Gastro-esophageal reflux disease without esophagitis: Secondary | ICD-10-CM | POA: Diagnosis not present

## 2017-11-24 DIAGNOSIS — H401134 Primary open-angle glaucoma, bilateral, indeterminate stage: Secondary | ICD-10-CM | POA: Diagnosis not present

## 2017-11-24 DIAGNOSIS — H2512 Age-related nuclear cataract, left eye: Secondary | ICD-10-CM | POA: Diagnosis not present

## 2017-11-24 DIAGNOSIS — H524 Presbyopia: Secondary | ICD-10-CM | POA: Diagnosis not present

## 2017-11-24 DIAGNOSIS — Z794 Long term (current) use of insulin: Secondary | ICD-10-CM | POA: Diagnosis not present

## 2017-11-24 DIAGNOSIS — E113293 Type 2 diabetes mellitus with mild nonproliferative diabetic retinopathy without macular edema, bilateral: Secondary | ICD-10-CM | POA: Diagnosis not present

## 2017-11-26 DIAGNOSIS — I10 Essential (primary) hypertension: Secondary | ICD-10-CM | POA: Diagnosis not present

## 2017-11-26 DIAGNOSIS — E1140 Type 2 diabetes mellitus with diabetic neuropathy, unspecified: Secondary | ICD-10-CM | POA: Diagnosis not present

## 2017-11-26 DIAGNOSIS — K219 Gastro-esophageal reflux disease without esophagitis: Secondary | ICD-10-CM | POA: Diagnosis not present

## 2017-11-26 DIAGNOSIS — D508 Other iron deficiency anemias: Secondary | ICD-10-CM | POA: Diagnosis not present

## 2017-12-18 DIAGNOSIS — E119 Type 2 diabetes mellitus without complications: Secondary | ICD-10-CM | POA: Diagnosis not present

## 2017-12-18 DIAGNOSIS — E559 Vitamin D deficiency, unspecified: Secondary | ICD-10-CM | POA: Diagnosis not present

## 2017-12-24 DIAGNOSIS — I10 Essential (primary) hypertension: Secondary | ICD-10-CM | POA: Diagnosis not present

## 2017-12-24 DIAGNOSIS — E1140 Type 2 diabetes mellitus with diabetic neuropathy, unspecified: Secondary | ICD-10-CM | POA: Diagnosis not present

## 2017-12-24 DIAGNOSIS — K219 Gastro-esophageal reflux disease without esophagitis: Secondary | ICD-10-CM | POA: Diagnosis not present

## 2017-12-24 DIAGNOSIS — I509 Heart failure, unspecified: Secondary | ICD-10-CM | POA: Diagnosis not present

## 2017-12-29 DIAGNOSIS — E113552 Type 2 diabetes mellitus with stable proliferative diabetic retinopathy, left eye: Secondary | ICD-10-CM | POA: Diagnosis not present

## 2017-12-29 DIAGNOSIS — H26491 Other secondary cataract, right eye: Secondary | ICD-10-CM | POA: Diagnosis not present

## 2017-12-29 DIAGNOSIS — E113551 Type 2 diabetes mellitus with stable proliferative diabetic retinopathy, right eye: Secondary | ICD-10-CM | POA: Diagnosis not present

## 2017-12-29 DIAGNOSIS — H43811 Vitreous degeneration, right eye: Secondary | ICD-10-CM | POA: Diagnosis not present

## 2018-01-04 DIAGNOSIS — I2510 Atherosclerotic heart disease of native coronary artery without angina pectoris: Secondary | ICD-10-CM | POA: Diagnosis not present

## 2018-01-04 DIAGNOSIS — E1140 Type 2 diabetes mellitus with diabetic neuropathy, unspecified: Secondary | ICD-10-CM | POA: Diagnosis not present

## 2018-01-04 DIAGNOSIS — I10 Essential (primary) hypertension: Secondary | ICD-10-CM | POA: Diagnosis not present

## 2018-01-04 DIAGNOSIS — K219 Gastro-esophageal reflux disease without esophagitis: Secondary | ICD-10-CM | POA: Diagnosis not present

## 2018-01-05 ENCOUNTER — Emergency Department (HOSPITAL_COMMUNITY)
Admission: EM | Admit: 2018-01-05 | Discharge: 2018-01-06 | Disposition: A | Discharge status: Home / Self Care | Payer: Medicare Other | Attending: Emergency Medicine | Admitting: Emergency Medicine

## 2018-01-05 ENCOUNTER — Emergency Department (HOSPITAL_COMMUNITY): Payer: Medicare Other

## 2018-01-05 ENCOUNTER — Encounter (HOSPITAL_COMMUNITY): Payer: Self-pay | Admitting: Emergency Medicine

## 2018-01-05 DIAGNOSIS — E119 Type 2 diabetes mellitus without complications: Secondary | ICD-10-CM | POA: Insufficient documentation

## 2018-01-05 DIAGNOSIS — Y939 Activity, unspecified: Secondary | ICD-10-CM | POA: Diagnosis not present

## 2018-01-05 DIAGNOSIS — M546 Pain in thoracic spine: Secondary | ICD-10-CM | POA: Diagnosis not present

## 2018-01-05 DIAGNOSIS — Z7902 Long term (current) use of antithrombotics/antiplatelets: Secondary | ICD-10-CM | POA: Insufficient documentation

## 2018-01-05 DIAGNOSIS — Y929 Unspecified place or not applicable: Secondary | ICD-10-CM | POA: Insufficient documentation

## 2018-01-05 DIAGNOSIS — W19XXXA Unspecified fall, initial encounter: Secondary | ICD-10-CM | POA: Diagnosis not present

## 2018-01-05 DIAGNOSIS — R52 Pain, unspecified: Secondary | ICD-10-CM | POA: Diagnosis not present

## 2018-01-05 DIAGNOSIS — S22058A Other fracture of T5-T6 vertebra, initial encounter for closed fracture: Secondary | ICD-10-CM | POA: Insufficient documentation

## 2018-01-05 DIAGNOSIS — Y999 Unspecified external cause status: Secondary | ICD-10-CM | POA: Diagnosis not present

## 2018-01-05 DIAGNOSIS — N183 Chronic kidney disease, stage 3 (moderate): Secondary | ICD-10-CM | POA: Diagnosis not present

## 2018-01-05 DIAGNOSIS — Z79899 Other long term (current) drug therapy: Secondary | ICD-10-CM | POA: Insufficient documentation

## 2018-01-05 DIAGNOSIS — W1789XA Other fall from one level to another, initial encounter: Secondary | ICD-10-CM | POA: Insufficient documentation

## 2018-01-05 DIAGNOSIS — Z794 Long term (current) use of insulin: Secondary | ICD-10-CM | POA: Diagnosis not present

## 2018-01-05 DIAGNOSIS — M545 Low back pain: Secondary | ICD-10-CM | POA: Diagnosis not present

## 2018-01-05 DIAGNOSIS — R531 Weakness: Secondary | ICD-10-CM | POA: Diagnosis present

## 2018-01-05 DIAGNOSIS — I129 Hypertensive chronic kidney disease with stage 1 through stage 4 chronic kidney disease, or unspecified chronic kidney disease: Secondary | ICD-10-CM | POA: Diagnosis not present

## 2018-01-05 DIAGNOSIS — M5489 Other dorsalgia: Secondary | ICD-10-CM | POA: Diagnosis not present

## 2018-01-05 DIAGNOSIS — S22050A Wedge compression fracture of T5-T6 vertebra, initial encounter for closed fracture: Secondary | ICD-10-CM | POA: Diagnosis not present

## 2018-01-05 LAB — CBG MONITORING, ED: GLUCOSE-CAPILLARY: 94 mg/dL (ref 70–99)

## 2018-01-05 MED ORDER — HYDROCODONE-ACETAMINOPHEN 5-325 MG PO TABS: 1.0000 | ORAL_TABLET | Freq: Four times a day (QID) | ORAL | 0 refills | Status: AC | PRN

## 2018-01-05 MED ORDER — HYDROCODONE-ACETAMINOPHEN 5-325 MG PO TABS
2.0000 | ORAL_TABLET | Freq: Once | ORAL | Status: AC
  Administered 2018-01-05: 2 via ORAL
  Filled 2018-01-05: qty 2

## 2018-01-05 MED ORDER — HYDROCODONE-ACETAMINOPHEN 5-325 MG PO TABS
2.0000 | ORAL_TABLET | Freq: Once | ORAL | Status: AC | PRN
  Administered 2018-01-06: 2 via ORAL
  Filled 2018-01-05: qty 2

## 2018-01-05 MED ORDER — HYDROCODONE-ACETAMINOPHEN 5-325 MG PO TABS: 1.0000 | ORAL_TABLET | Freq: Once | ORAL | Status: DC

## 2018-01-05 MED ORDER — HYDROCODONE-ACETAMINOPHEN 5-325 MG PO TABS
1.0000 | ORAL_TABLET | Freq: Once | ORAL | Status: AC
  Administered 2018-01-05: 1 via ORAL
  Filled 2018-01-05: qty 1

## 2018-01-05 NOTE — ED Triage Notes (Addendum)
Pt here via [facility], lives at [facility], pt was in facility's transport [place] after an eye appt and the driver was trying to get wheelchair out of the [place] when the wheelchair tipped backwards. C/o back pain.  Pt denies hitting head. Pt has left side deficits from a previous stroke.

## 2018-01-05 NOTE — ED Notes (Addendum)
Pt turned on right side to relieve pressure on sacrum.

## 2018-01-05 NOTE — ED Notes (Signed)
Pt given turkey sandwich and water

## 2018-01-05 NOTE — ED Notes (Signed)
Pt updated and d/c instructions explained to her. Pt given graham crackers repositioned and  lights turned down while waiting on [facility]AR.

## 2018-01-05 NOTE — ED Provider Notes (Signed)
Patient injured her back while being transferred from an ambulance this afternoon.  She complains of midline low back pain.  No other complaint.  At 7:15 PM she reports inadequate pain relief after treatment with Norco.  Additional Norco ordered. X-rays viewed by me   Doug Sou, MD 01/05/18 1924

## 2018-01-05 NOTE — ED Provider Notes (Signed)
[facility] EMERGENCY DEPARTMENT Provider Note   CSN: [id number] Arrival date & time: 01/05/18  1404     History   Chief Complaint No chief complaint on file.   HPI VARINA HULON is a 62 y.o. female.  HPI   BRISTYN KULESZA is a 62 y.o. female, with a history of stroke causing left-sided extremity weakness, HTN, DM, presenting to the ED with back pain following a fall that occurred earlier today.  Patient is wheelchair-bound, but was being rolled off of transport van wheelchair lift, wheelchair tipped backward, hitting the ground.  She complains of mid back pain, 10/10, unable to give a full description of the pain, nonradiating.  Denies head injury, LOC, neck pain, nausea/vomiting, acute neuro deficits, chest pain, abdominal pain, shortness of breath, or any other complaints.       Past Medical History:  Diagnosis Date  . Anemia   . Blindness   . Chronic kidney disease   . Diabetes mellitus without complication (HCC)   . DVT (deep venous thrombosis) (HCC)   . GERD (gastroesophageal reflux disease)   . Hypertension   . Stroke [facility])     Patient Active Problem List   Diagnosis Date Noted  . Decubitus ulcer of right buttock, stage 1 12/06/2015  . Unspecified essential hypertension 04/02/2013  . CKD (chronic kidney disease), stage III (HCC) 04/02/2013  . Anemia 04/02/2013  . Gas gangrene of foot (HCC) 04/01/2013  . Atherosclerosis of native arteries of the extremities with ulceration(440.23) 03/22/2013  . Diabetic osteomyelitis (HCC) 09/09/2012  . Renal insufficiency, mild 09/09/2012  . Type II or unspecified type diabetes mellitus with neurological manifestations, not stated as uncontrolled(250.60) 09/09/2012    Past Surgical History:  Procedure Laterality Date  . ABDOMINAL AORTAGRAM N/A 03/29/2013   Procedure: ABDOMINAL AORTAGRAM;  Surgeon: Nada Libman, MD;  Location: [facility] CATH LAB;  Service: Cardiovascular;  Laterality: N/A;  . AMPUTATION N/A  04/04/2013   Procedure: AMPUTATION BELOW KNEE;  Surgeon: Larina Earthly, MD;  Location: [facility] OR;  Service: Vascular;  Laterality: N/A;  . APPENDECTOMY    . LIPOMA EXCISION     from L flank     OB History   None      Home Medications    Prior to Admission medications   Medication Sig Start Date End Date Taking? Authorizing Provider  acetaminophen (TYLENOL) 325 MG tablet Take 650 mg by mouth every 4 (four) hours as needed for mild pain.   Yes [provider]  amLODipine (NORVASC) 10 MG tablet Take 10 mg by mouth daily.   Yes [provider]  chlorhexidine (PERIDEX) 0.12 % solution Use as directed 15 mLs in the mouth or throat 2 (two) times daily.   Yes [provider]  cholecalciferol (VITAMIN D) 1000 units tablet Take 2,000 Units by mouth every morning.   Yes [provider]  cloNIDine (CATAPRES) 0.1 MG tablet Take 0.1 mg by mouth 2 (two) times daily.   Yes [provider]  diphenhydrAMINE (BENADRYL) 25 MG tablet Take 25 mg by mouth every 4 (four) hours as needed for itching.    Yes [provider]  docusate sodium (COLACE) 100 MG capsule Take 100 mg by mouth 2 (two) times daily.   Yes [provider]  DULoxetine (CYMBALTA) 20 MG capsule Take 40 mg by mouth daily.   Yes [provider]  fluticasone (FLONASE) 50 MCG/ACT nasal spray Place 2 sprays into both nostrils at bedtime.  Yes [provider]  gabapentin (NEURONTIN) 300 MG capsule Take 300 mg by mouth 2 (two) times daily.    Yes [provider]  Glucose Blood (ACCU-CHEK AVIVA PLUS VI) 1 each by Other route See admin instructions. Check blood sugar twice daily.   Yes [provider]  insulin aspart (NOVOLOG) 100 UNIT/ML injection Inject 40 Units into the skin 3 (three) times daily with meals.   Yes [provider]  insulin detemir (LEVEMIR) 100 UNIT/ML injection Inject 38-65 Units into the skin See admin instructions. Per patient  administration record - Patient is using 38 units at bedtime and 65 units in the morning.   Yes [provider]  insulin detemir (LEVEMIR) 100 unit/ml SOLN Inject 65 Units into the skin every morning.   Yes [provider]  Lactobacillus TABS Take 1 tablet by mouth daily.   Yes [provider]  latanoprost (XALATAN) 0.005 % ophthalmic solution Place 1 drop into both eyes at bedtime.   Yes [provider]  loratadine (CLARITIN) 10 MG tablet Take 10 mg by mouth daily.   Yes [provider]  losartan (COZAAR) 100 MG tablet Take 100 mg by mouth every morning.   Yes [provider]  meloxicam (MOBIC) 7.5 MG tablet Take 7.5 mg by mouth daily.   Yes [provider]  metoprolol tartrate (LOPRESSOR) 100 MG tablet Take 100 mg by mouth 2 (two) times daily.   Yes [provider]  Multiple Vitamins-Minerals (MULTIVITAMIN WITH MINERALS) tablet Take 1 tablet by mouth daily.   Yes [provider]  oxybutynin (DITROPAN-XL) 5 MG 24 hr tablet Take 5 mg by mouth daily.   Yes [provider]  pioglitazone (ACTOS) 30 MG tablet Take 30 mg by mouth daily.   Yes [provider]  ranitidine (ZANTAC) 150 MG tablet Take 150 mg by mouth 2 (two) times daily.    Yes [provider]  amLODipine (NORVASC) 5 MG tablet Take 1 tablet (5 mg total) by mouth daily. 04/07/13   Kathlen Mody, MD  aspirin 81 MG tablet Take 81 mg by mouth daily.    [provider]  DULoxetine (CYMBALTA) 60 MG capsule Take 60 mg by mouth daily.    [provider]  ferrous sulfate 325 (65 FE) MG tablet Take 325 mg by mouth daily with breakfast.    [provider]  glimepiride (AMARYL) 4 MG tablet Take 4 mg by mouth daily with breakfast.    [provider]  HYDROcodone-acetaminophen (NORCO/VICODIN) 5-325 MG per tablet Take 1 tablet by mouth every 4 (four) hours as needed for moderate pain.    [provider]    HYDROcodone-acetaminophen (NORCO/VICODIN) 5-325 MG tablet Take 1-2 tablets by mouth every 6 (six) hours as needed for severe pain. 01/05/18   Couper Juncaj C, PA-C  insulin aspart (NOVOLOG) 100 UNIT/ML injection Inject 100 Units into the skin 3 (three) times daily before meals.    [provider]  insulin glargine (LANTUS) 100 UNIT/ML injection Inject 50 Units into the skin at bedtime.    [provider]  insulin lispro (HUMALOG) 100 UNIT/ML injection Inject 0-15 Units into the skin 2 (two) times daily. Per sliding scale 0-200 = 0 units, 200-300 = 10 units, 300-500 = 15 units, >500 units = call MD.    [provider]  losartan-hydrochlorothiazide (HYZAAR) 100-12.5 MG per tablet Take 1 tablet by mouth daily.    [provider]  metoprolol succinate (TOPROL-XL) 50 MG 24  hr tablet Take 50 mg by mouth daily. Take with or immediately following a meal.    [provider]  potassium chloride SA (K-DUR,KLOR-CON) 20 MEQ tablet Take 20 mEq by mouth daily.    [provider]  pravastatin (PRAVACHOL) 20 MG tablet Take 20 mg by mouth daily.    [provider]  solifenacin (VESICARE) 5 MG tablet Take 5 mg by mouth daily.     [provider]    Family History Family History  Problem Relation Age of Onset  . Diabetes Mother   . Heart disease Mother   . Hypertension Mother   . Peripheral vascular disease Mother        amputation  . Diabetes Father   . Hypertension Father   . Varicose Veins Father   . Peripheral vascular disease Father        amputation  . Cancer Sister   . Diabetes Sister   . Hypertension Sister   . Diabetes Brother   . Hypertension Brother   . Peripheral vascular disease Brother        amputation    Social History Social History   Tobacco Use  . Smoking status: Never Smoker  . Smokeless tobacco: Never Used  Substance Use Topics  . Alcohol use: No  . Drug use: No     Allergies   Procardia [nifedipine]  and Phenergan [promethazine hcl]   Review of Systems Review of Systems  Respiratory: Negative for shortness of breath.   Cardiovascular: Negative for chest pain.  Gastrointestinal: Negative for abdominal pain, nausea and vomiting.  Musculoskeletal: Positive for back pain. Negative for neck pain.  Neurological: Negative for syncope, weakness, light-headedness, numbness and headaches.  All other systems reviewed and are negative.    Physical Exam Updated Vital Signs BP (!) 182/91   Pulse 63   Temp 98.3 F (36.8 C) (Oral)   Resp 14   SpO2 100%   Physical Exam  Constitutional: She appears well-developed and well-nourished. No distress.  HENT:  Head: Normocephalic and atraumatic.  Eyes: Conjunctivae are normal.  Neck: Neck supple.  Cardiovascular: Normal rate, regular rhythm, normal heart sounds and intact distal pulses.  Pulmonary/Chest: Effort normal and breath sounds normal. No respiratory distress.  Abdominal: Soft. There is no tenderness. There is no guarding.  Musculoskeletal: She exhibits tenderness. She exhibits no edema.       Back:  No midline spinal tenderness.   Lymphadenopathy:    She has no cervical adenopathy.  Neurological: She is alert.  Motor function intact in the right extremities.  No motor function in the left extremities, per patient's baseline from her stroke.  Skin: Skin is warm and dry. She is not diaphoretic.  Psychiatric: She has a normal mood and affect. Her behavior is normal.  Nursing note and vitals reviewed.    ED Treatments / Results  Labs (all labs ordered are listed, but only abnormal results are displayed) Labs Reviewed  CBG MONITORING, ED    EKG None  Radiology Dg Thoracic Spine 2 View  Result Date: 01/05/2018 CLINICAL DATA:  Fall.  Back pain EXAM: THORACIC SPINE 2 VIEWS COMPARISON:  None. FINDINGS: Mild wedging approximately T6 vertebral body. Mild fracture of indeterminate age. No other fracture or mass. Mild thoracic disc  degeneration at multiple levels. IMPRESSION: Mild anterior wedging of approximately T6 compatible with mild fracture of indeterminate age. Electronically Signed   By: Marlan Palau M.D.   On: 01/05/2018 18:36   Dg Lumbar Spine Complete  Result Date: 01/05/2018 CLINICAL DATA:  Fall.  Back pain EXAM: LUMBAR SPINE - COMPLETE 4+ VIEW COMPARISON:  None. FINDINGS: Negative for fracture. Normal alignment. Multilevel mild disc degeneration and spurring throughout the lumbar spine. Calcified uterine fibroid, anterior to IUD. IMPRESSION: Negative for lumbar fracture Electronically Signed   By: Marlan Palau M.D.   On: 01/05/2018 18:38    Procedures Procedures (including critical care time)  Medications Ordered in ED Medications  HYDROcodone-acetaminophen (NORCO/VICODIN) 5-325 MG per tablet 2 tablet (2 tablets Oral Given 01/05/18 1638)  HYDROcodone-acetaminophen (NORCO/VICODIN) 5-325 MG per tablet 1 tablet (1 tablet Oral Given 01/05/18 1927)     Initial Impression / Assessment and Plan / ED Course  I have reviewed the triage vital signs and the nursing notes.  Pertinent labs & imaging results that were available during my care of the patient were reviewed by me and considered in my medical decision making (see chart for details).  Clinical Course as of Jan 05 2125  Tue Jan 05, 2018  1907 Spoke with Dr. Maurice Small, neurosurgeon, here in the ED on another case. States he will see the patient.   [SJ]    Clinical Course User Index [SJ] Cadie Sorci C, PA-C    Patient presents for evaluation following a fall complaining of back pain.  Question of age indeterminant T6 fracture on x-ray.  She does not appear to have acute neurologic deficits.  No surgical intervention indicated.  She will follow-up with her PCP primarily for pain management.  Findings and plan of care discussed with Doug Sou, MD. Dr. Ethelda Chick personally evaluated and examined this patient.  Search of [place] narcotic database shows  no entries for this patient.    Final Clinical Impressions(s) / ED Diagnoses   Final diagnoses:  Other closed fracture of sixth thoracic vertebra, initial encounter [facility])    ED Discharge Orders         Ordered    HYDROcodone-acetaminophen (NORCO/VICODIN) 5-325 MG tablet  Every 6 hours PRN     01/05/18 2100           Anselm Pancoast, PA-C 01/05/18 2135    Doug Sou, MD 01/05/18 2251

## 2018-01-05 NOTE — ED Notes (Addendum)
Report given to Jennifer at [facility] 

## 2018-01-05 NOTE — Discharge Instructions (Signed)
There was evidence of a fracture to the thoracic spine.  This may have occurred from the fall today or it could have been there previously.  Acetaminophen: May take acetaminophen (generic for Tylenol), as needed, for pain. Your daily total maximum amount of acetaminophen from all sources should be limited to 4000mg /day for persons without liver problems, or 2000mg /day for those with liver problems. Vicodin: May take Vicodin (hydrocodone-acetaminophen) as needed for severe pain.  Do not drive or perform other dangerous activities while taking the Vicodin.  Please note that each pill of Vicodin contains 325 mg of acetaminophen (Tylenol) and the above dosage limits apply.  Follow-up with your primary care provider for future management of this issue.  Should you continue to have the same amount of pain without improvement, may need to follow-up with neurosurgery.

## 2018-01-05 NOTE — ED Notes (Signed)
Patient transported to X-ray 

## 2018-01-05 NOTE — Consult Note (Signed)
Neurosurgery Consultation  Reason for Consult: Closed fracture of the thoracic spine  Referring Physician: Ethelda Chick  CC: Back pain  HPI: This is a 62 y.o. woman with a h/o b/l BKA, prior stroke with residual left sided weakness and dysphasia who presents after a fall out of her wheelchair with immediate back pain. No new weakness, numbness, or parasthesias, no recent change in bowel or bladder function.   ROS: A 14 point ROS was performed and is negative except as noted in the HPI.   PMHx:  Past Medical History:  Diagnosis Date  . Anemia   . Blindness   . Chronic kidney disease   . Diabetes mellitus without complication (HCC)   . DVT (deep venous thrombosis) (HCC)   . GERD (gastroesophageal reflux disease)   . Hypertension   . Stroke [facility])    FamHx:  Family History  Problem Relation Age of Onset  . Diabetes Mother   . Heart disease Mother   . Hypertension Mother   . Peripheral vascular disease Mother        amputation  . Diabetes Father   . Hypertension Father   . Varicose Veins Father   . Peripheral vascular disease Father        amputation  . Cancer Sister   . Diabetes Sister   . Hypertension Sister   . Diabetes Brother   . Hypertension Brother   . Peripheral vascular disease Brother        amputation   SocHx:  reports that she has never smoked. She has never used smokeless tobacco. She reports that she does not drink alcohol or use drugs.  Exam: Vital signs in last 24 hours: Temp:  [98.3 F (36.8 C)] 98.3 F (36.8 C) (10/08 1415) Pulse Rate:  [54-64] 61 (10/08 1915) Resp:  [9-16] 13 (10/08 1849) BP: (141-182)/(77-95) 149/86 (10/08 1915) SpO2:  [93 %-100 %] 93 % (10/08 1915) General: Awake, alert, cooperative, lying in bed in NAD, appears chronically ill Head: normocephalic and atruamatic HEENT: neck supple Pulmonary: breathing room air comfortably, no evidence of increased work of breathing Psych: flat affect, minimal speech output Abdomen: obese S NT  ND Extremities: bilateral BKAs, upper extremities warm and well perfused Neuro:  Awake/alert, receptive speech appears intact but minimal verbal output, answers yes and no questions well Strength 5/5 on R, 4-/5 in LUE with increased tone, 1/5 in LLE, SILT on right, decreased on left   Assessment and Plan: 62 y.o. woman s/p fall with back pain. XR T-spine personally reviewed, which shows mild compression fracture of T6, no significant loss of height or apparent retropulsion.  -no neurosurgical intervention indicated at this time -activity as tolerated, no restrictions, patient can follow up if her symptoms continue to be bothersome, otherwise no need for scheduled neurosurgical follow up  Jadene Pierini, MD 01/05/18 7:25 PM [facility]

## 2018-01-05 NOTE — ED Notes (Addendum)
Pt turned on her left side. 

## 2018-01-05 NOTE — ED Notes (Signed)
Pressure wound noted to sacrum on left and right.

## 2018-01-06 DIAGNOSIS — S22059A Unspecified fracture of T5-T6 vertebra, initial encounter for closed fracture: Secondary | ICD-10-CM | POA: Diagnosis not present

## 2018-01-06 DIAGNOSIS — S22058A Other fracture of T5-T6 vertebra, initial encounter for closed fracture: Secondary | ICD-10-CM | POA: Diagnosis not present

## 2018-01-20 DIAGNOSIS — I10 Essential (primary) hypertension: Secondary | ICD-10-CM | POA: Diagnosis not present

## 2018-01-20 DIAGNOSIS — R1031 Right lower quadrant pain: Secondary | ICD-10-CM | POA: Diagnosis not present

## 2018-01-20 DIAGNOSIS — K219 Gastro-esophageal reflux disease without esophagitis: Secondary | ICD-10-CM | POA: Diagnosis not present

## 2018-01-20 DIAGNOSIS — E1140 Type 2 diabetes mellitus with diabetic neuropathy, unspecified: Secondary | ICD-10-CM | POA: Diagnosis not present

## 2018-01-21 DIAGNOSIS — K5900 Constipation, unspecified: Secondary | ICD-10-CM | POA: Diagnosis not present

## 2018-01-21 DIAGNOSIS — R1031 Right lower quadrant pain: Secondary | ICD-10-CM | POA: Diagnosis not present

## 2018-01-25 DIAGNOSIS — R1031 Right lower quadrant pain: Secondary | ICD-10-CM | POA: Diagnosis not present

## 2018-01-25 DIAGNOSIS — K5900 Constipation, unspecified: Secondary | ICD-10-CM | POA: Diagnosis not present

## 2018-01-29 DIAGNOSIS — E559 Vitamin D deficiency, unspecified: Secondary | ICD-10-CM | POA: Diagnosis not present

## 2018-02-17 DIAGNOSIS — K219 Gastro-esophageal reflux disease without esophagitis: Secondary | ICD-10-CM | POA: Diagnosis not present

## 2018-02-17 DIAGNOSIS — I2510 Atherosclerotic heart disease of native coronary artery without angina pectoris: Secondary | ICD-10-CM | POA: Diagnosis not present

## 2018-02-17 DIAGNOSIS — E1140 Type 2 diabetes mellitus with diabetic neuropathy, unspecified: Secondary | ICD-10-CM | POA: Diagnosis not present

## 2018-02-17 DIAGNOSIS — I10 Essential (primary) hypertension: Secondary | ICD-10-CM | POA: Diagnosis not present

## 2018-02-19 DIAGNOSIS — R109 Unspecified abdominal pain: Secondary | ICD-10-CM | POA: Diagnosis not present

## 2018-03-15 DIAGNOSIS — E1140 Type 2 diabetes mellitus with diabetic neuropathy, unspecified: Secondary | ICD-10-CM | POA: Diagnosis not present

## 2018-03-15 DIAGNOSIS — K219 Gastro-esophageal reflux disease without esophagitis: Secondary | ICD-10-CM | POA: Diagnosis not present

## 2018-03-15 DIAGNOSIS — I10 Essential (primary) hypertension: Secondary | ICD-10-CM | POA: Diagnosis not present

## 2018-03-15 DIAGNOSIS — I2510 Atherosclerotic heart disease of native coronary artery without angina pectoris: Secondary | ICD-10-CM | POA: Diagnosis not present

## 2018-03-19 DIAGNOSIS — D649 Anemia, unspecified: Secondary | ICD-10-CM | POA: Diagnosis not present

## 2018-03-19 DIAGNOSIS — I10 Essential (primary) hypertension: Secondary | ICD-10-CM | POA: Diagnosis not present

## 2018-03-19 DIAGNOSIS — E559 Vitamin D deficiency, unspecified: Secondary | ICD-10-CM | POA: Diagnosis not present

## 2018-03-19 DIAGNOSIS — E119 Type 2 diabetes mellitus without complications: Secondary | ICD-10-CM | POA: Diagnosis not present

## 2018-03-25 DIAGNOSIS — R05 Cough: Secondary | ICD-10-CM | POA: Diagnosis not present

## 2018-05-31 NOTE — Telephone Encounter (Signed)
Note sent to nurse. 

## 2018-06-30 DEATH — deceased

## 2019-09-15 IMAGING — DX DG LUMBAR SPINE COMPLETE 4+V
5 series · 5 of 5 positions shown · non-contrast
Comparison: None.

CLINICAL DATA: Fall.  Back pain

EXAM:
LUMBAR SPINE - COMPLETE 4+ VIEW

[t lumbar spine ap]
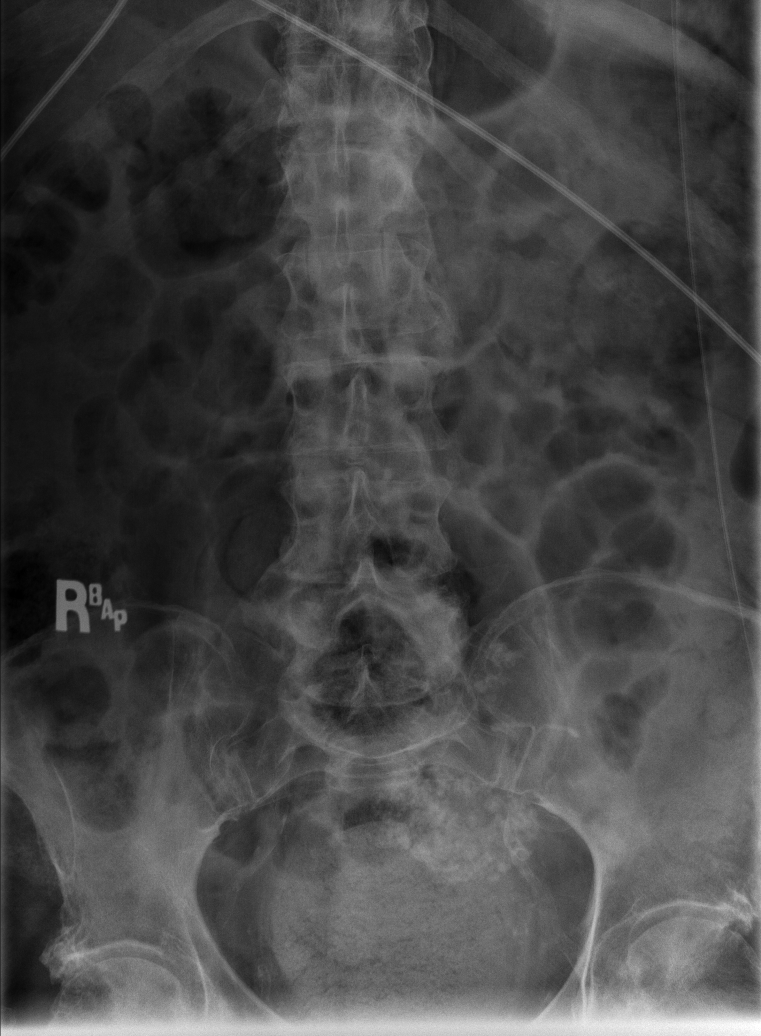

[t lumbar spine obl (1 of 2)]
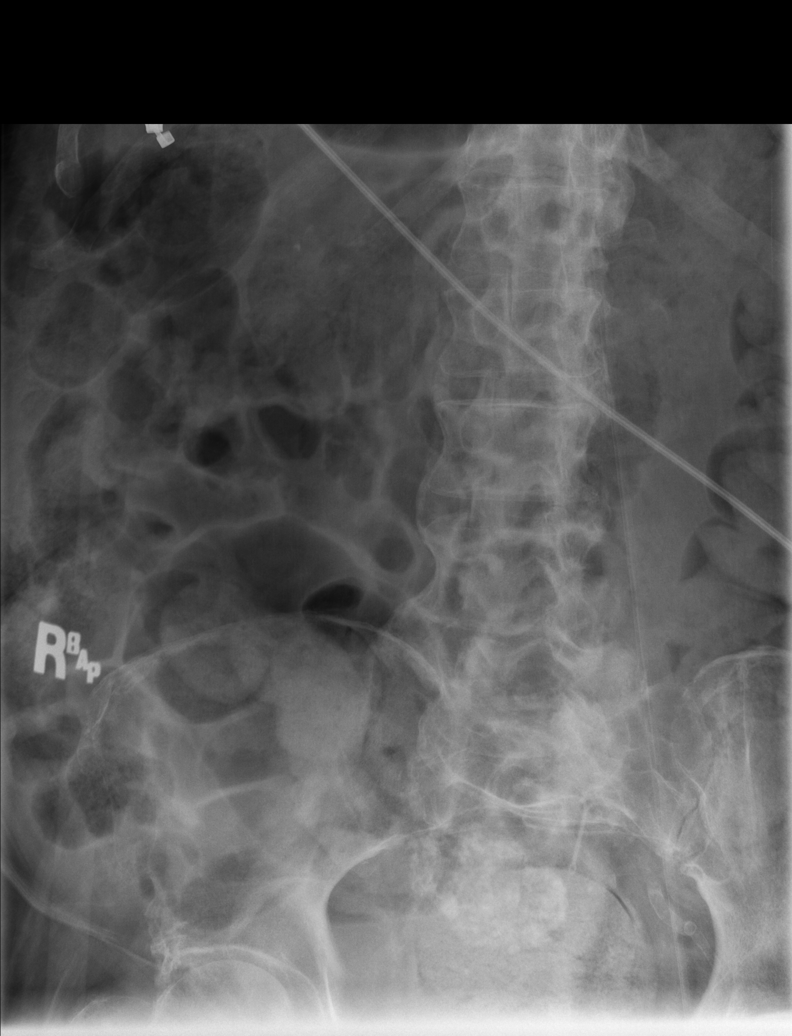

[t lumbar spine obl (2 of 2)]
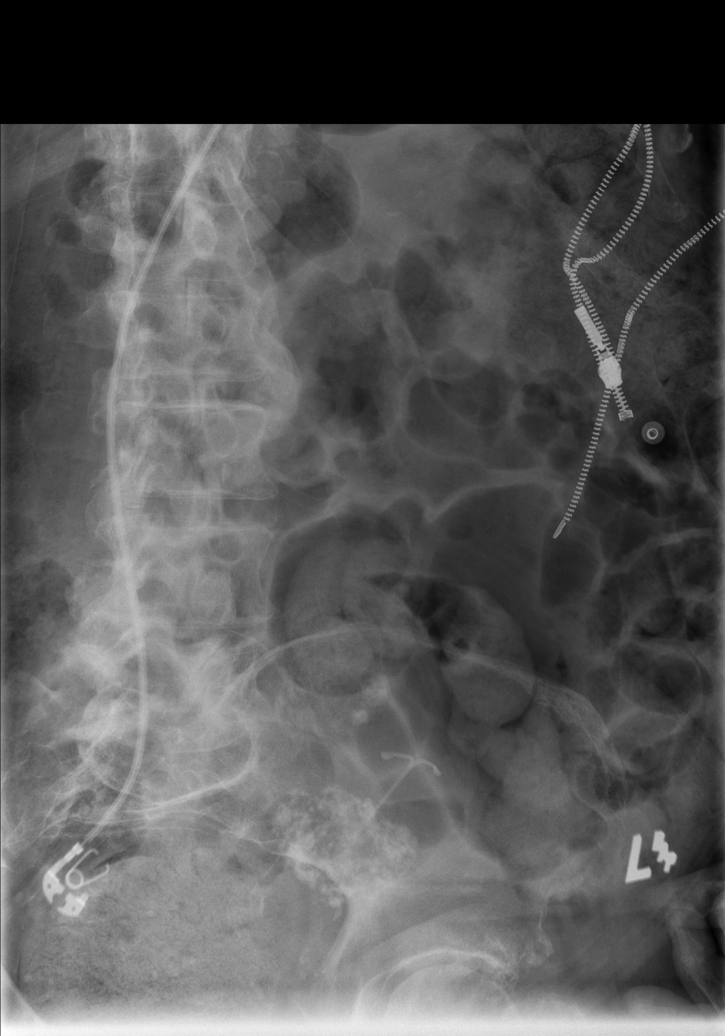

[t lumbar spine lat]
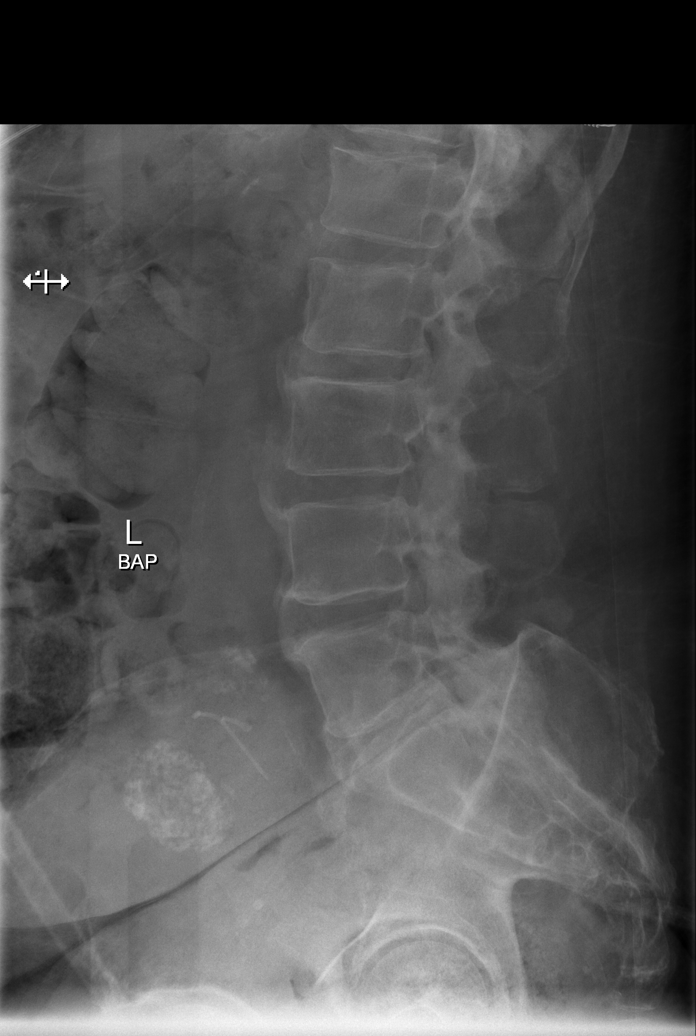

[t lumbar l-5 s-1 spot]
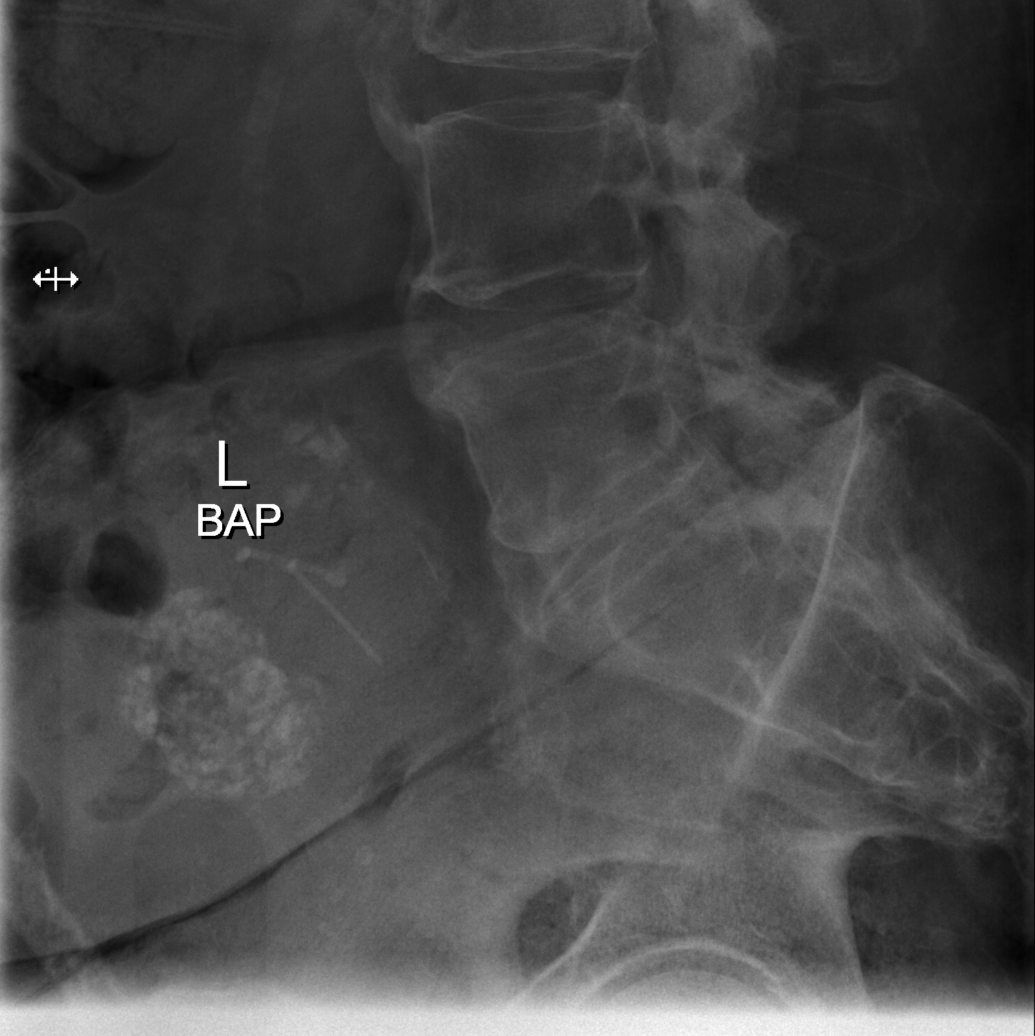

[5 of 5 positions shown; findings below may reference images not displayed]

FINDINGS: Negative for fracture. Normal alignment. Multilevel mild disc
degeneration and spurring throughout the lumbar spine. Calcified
uterine fibroid, anterior to IUD..
IMPRESSION: Negative for lumbar fracture

## 2019-09-15 IMAGING — DX DG THORACIC SPINE 2V
4 series · 4 of 4 positions shown · non-contrast
Comparison: None.

CLINICAL DATA: Fall.  Back pain

EXAM:
THORACIC SPINE 2 VIEWS

[t thoracic spine ap (1 of 2)]
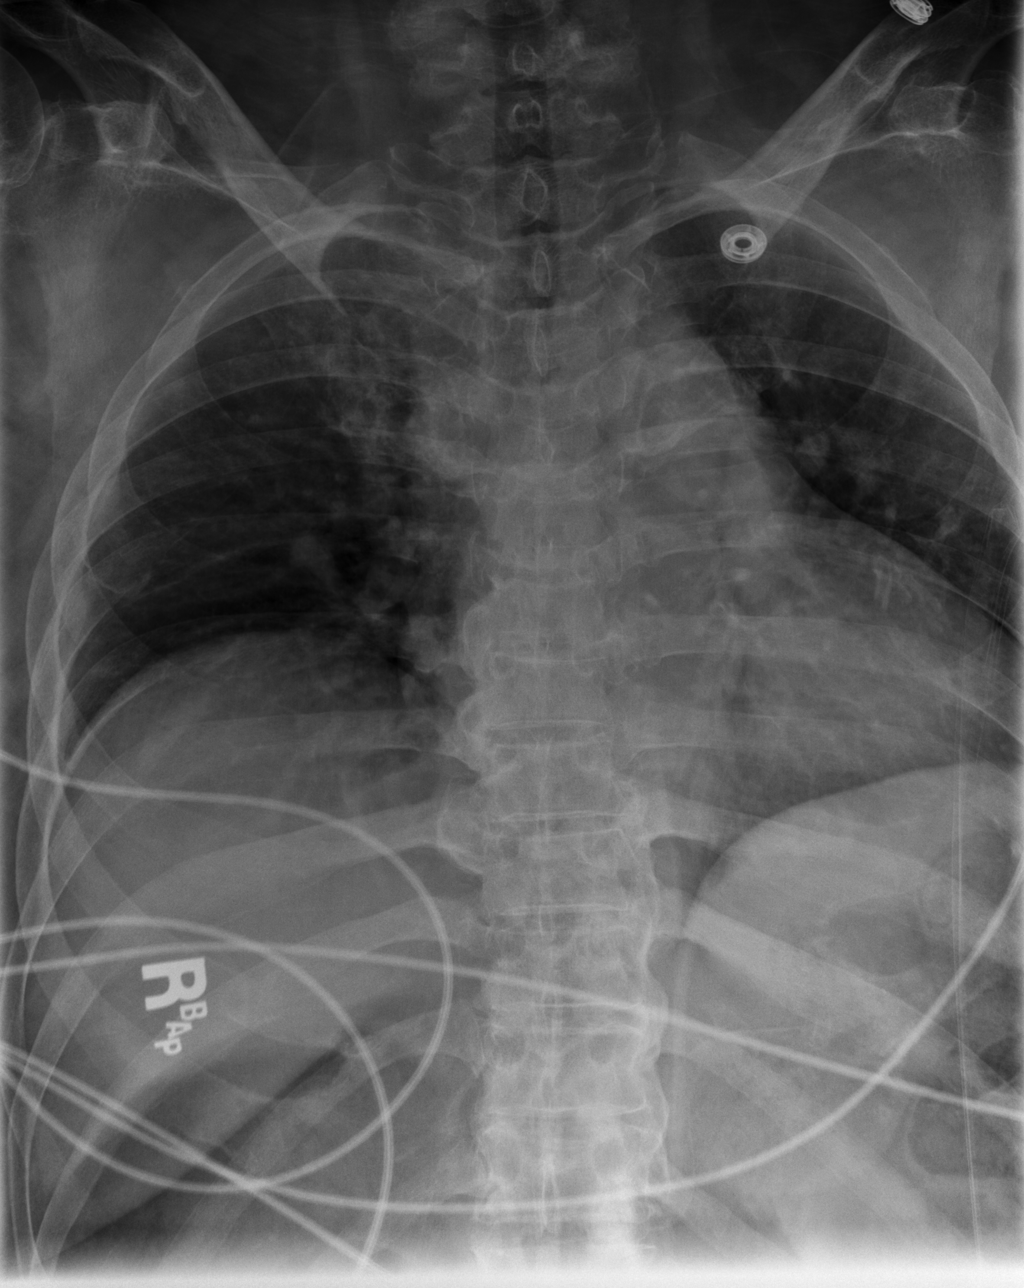

[t thoracic spine ap (2 of 2)]
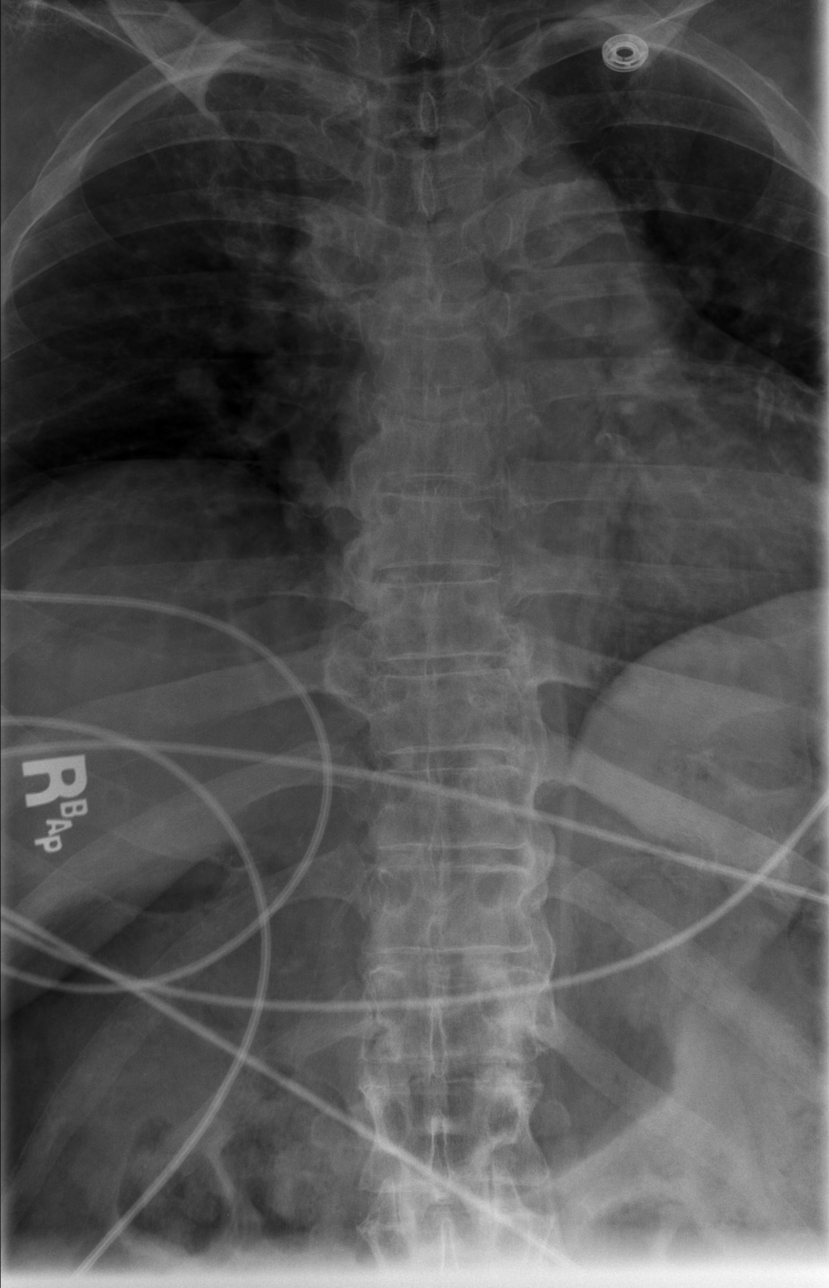

[t thoracic spine lat (1 of 2)]
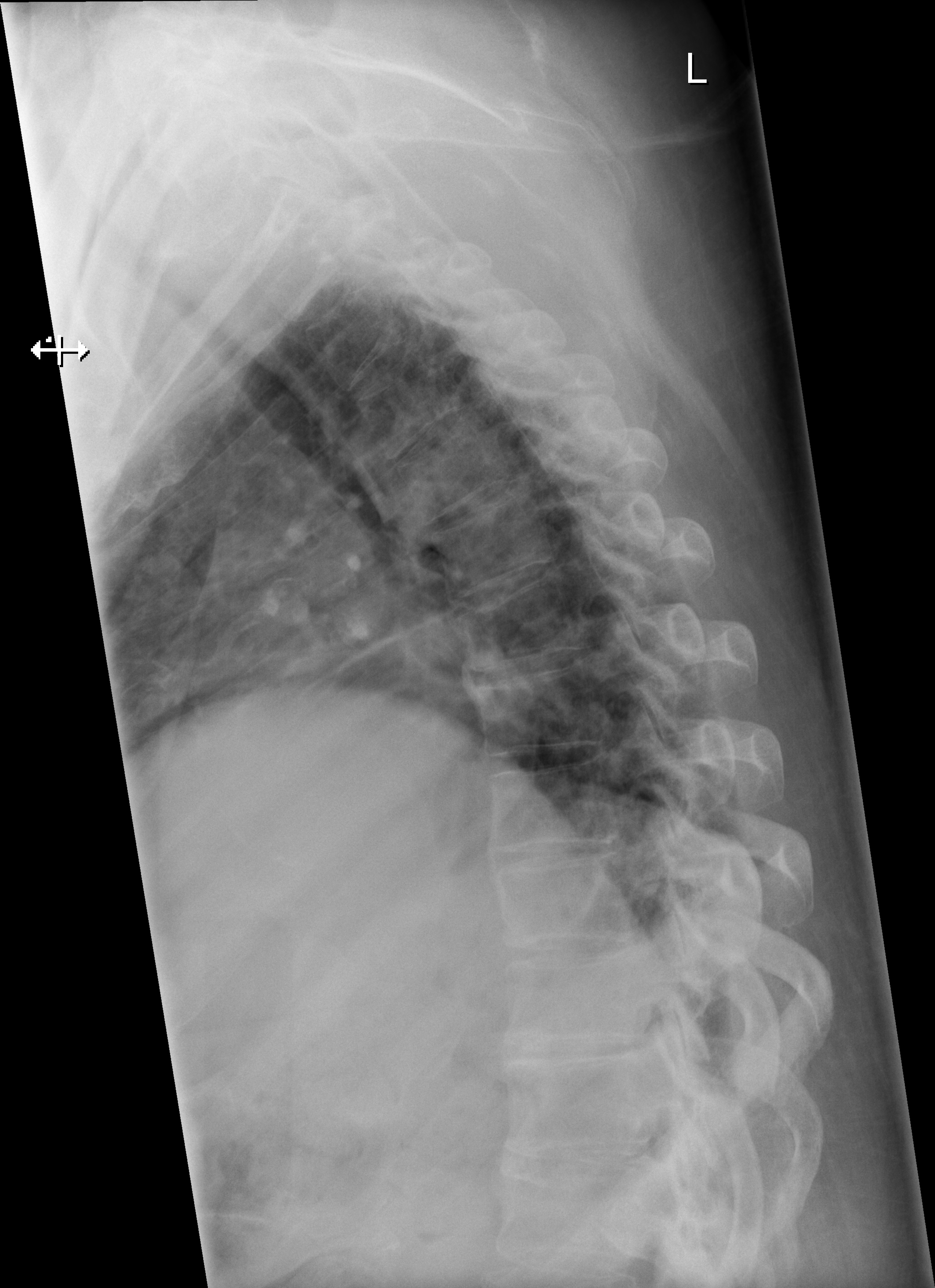

[t thoracic spine lat (2 of 2)]
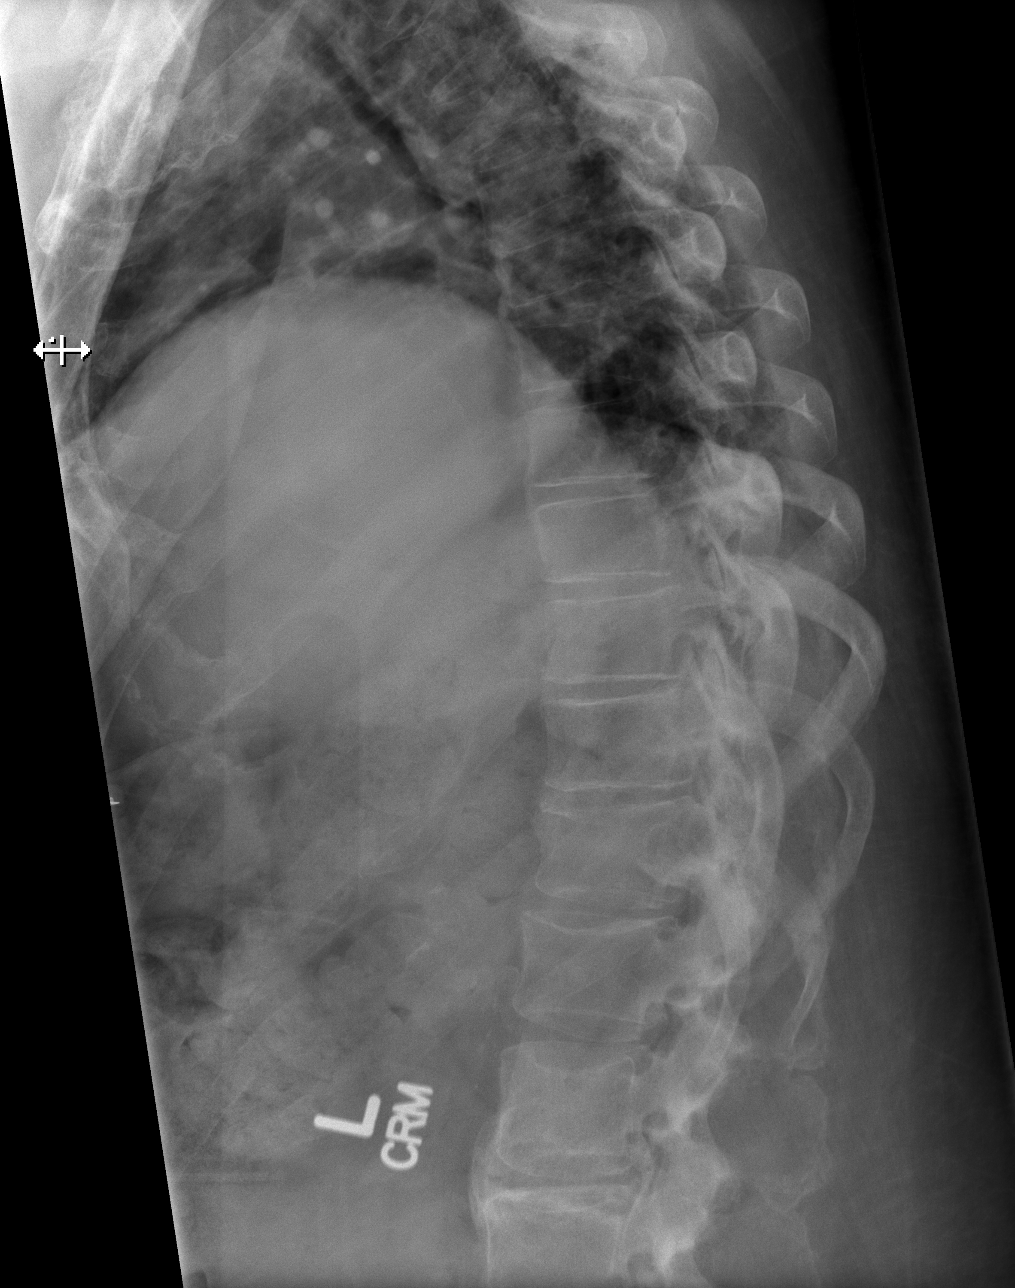

[4 of 4 positions shown; findings below may reference images not displayed]

FINDINGS: Mild wedging approximately T6 vertebral body. Mild fracture of
indeterminate age. No other fracture or mass. Mild thoracic disc
degeneration at multiple levels.
IMPRESSION: Mild anterior wedging of approximately T6 compatible with mild
fracture of indeterminate age.
